# Patient Record
Sex: Female | Born: 1965 | Race: White | Hispanic: No | Marital: Married | Smoking: Never smoker
Health system: Southern US, Community
[De-identification: ages and names within clinical notes are randomized; demographics above are authoritative.]

## PROBLEM LIST (undated history)

## (undated) DIAGNOSIS — E079 Disorder of thyroid, unspecified: Secondary | ICD-10-CM

## (undated) DIAGNOSIS — R011 Cardiac murmur, unspecified: Secondary | ICD-10-CM

## (undated) DIAGNOSIS — F329 Major depressive disorder, single episode, unspecified: Secondary | ICD-10-CM

## (undated) DIAGNOSIS — N200 Calculus of kidney: Secondary | ICD-10-CM

## (undated) DIAGNOSIS — G43909 Migraine, unspecified, not intractable, without status migrainosus: Secondary | ICD-10-CM

## (undated) DIAGNOSIS — R112 Nausea with vomiting, unspecified: Secondary | ICD-10-CM

## (undated) DIAGNOSIS — F319 Bipolar disorder, unspecified: Secondary | ICD-10-CM

## (undated) DIAGNOSIS — F419 Anxiety disorder, unspecified: Secondary | ICD-10-CM

## (undated) DIAGNOSIS — K219 Gastro-esophageal reflux disease without esophagitis: Secondary | ICD-10-CM

## (undated) DIAGNOSIS — Z9189 Other specified personal risk factors, not elsewhere classified: Secondary | ICD-10-CM

## (undated) DIAGNOSIS — F32A Depression, unspecified: Secondary | ICD-10-CM

## (undated) DIAGNOSIS — C439 Malignant melanoma of skin, unspecified: Secondary | ICD-10-CM

## (undated) DIAGNOSIS — E039 Hypothyroidism, unspecified: Secondary | ICD-10-CM

## (undated) DIAGNOSIS — R51 Headache: Secondary | ICD-10-CM

## (undated) DIAGNOSIS — Z9889 Other specified postprocedural states: Secondary | ICD-10-CM

## (undated) DIAGNOSIS — R519 Headache, unspecified: Secondary | ICD-10-CM

## (undated) HISTORY — DX: Anxiety disorder, unspecified: F41.9

## (undated) HISTORY — DX: Migraine, unspecified, not intractable, without status migrainosus: G43.909

## (undated) HISTORY — PX: INNER EAR SURGERY: SHX679

## (undated) HISTORY — DX: Depression, unspecified: F32.A

## (undated) HISTORY — PX: BREAST EXCISIONAL BIOPSY: SUR124

## (undated) HISTORY — DX: Bipolar disorder, unspecified: F31.9

## (undated) HISTORY — PX: WISDOM TOOTH EXTRACTION: SHX21

## (undated) HISTORY — DX: Disorder of thyroid, unspecified: E07.9

## (undated) HISTORY — DX: Other specified personal risk factors, not elsewhere classified: Z91.89

## (undated) HISTORY — DX: Calculus of kidney: N20.0

## (undated) HISTORY — DX: Major depressive disorder, single episode, unspecified: F32.9

## (undated) HISTORY — DX: Malignant melanoma of skin, unspecified: C43.9

## (undated) HISTORY — DX: Headache: R51

## (undated) HISTORY — DX: Headache, unspecified: R51.9

## (undated) HISTORY — DX: Gastro-esophageal reflux disease without esophagitis: K21.9

## (undated) HISTORY — DX: Cardiac murmur, unspecified: R01.1

---
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
## 1997-12-17 ENCOUNTER — Inpatient Hospital Stay (HOSPITAL_COMMUNITY): Admission: AD | Admit: 1997-12-17 | Discharge: 1997-12-17 | Payer: Self-pay | Admitting: Obstetrics and Gynecology

## 1998-03-21 ENCOUNTER — Ambulatory Visit (HOSPITAL_COMMUNITY): Admission: RE | Admit: 1998-03-21 | Discharge: 1998-03-21 | Payer: Self-pay | Admitting: Obstetrics and Gynecology

## 1998-06-18 ENCOUNTER — Inpatient Hospital Stay (HOSPITAL_COMMUNITY): Admission: AD | Admit: 1998-06-18 | Discharge: 1998-06-21 | Payer: Self-pay | Admitting: Obstetrics and Gynecology

## 1998-07-14 HISTORY — PX: ABDOMINAL HYSTERECTOMY: SHX81

## 1998-07-14 HISTORY — PX: CHOLECYSTECTOMY: SHX55

## 1998-07-23 ENCOUNTER — Other Ambulatory Visit: Admission: RE | Admit: 1998-07-23 | Discharge: 1998-07-23 | Payer: Self-pay | Admitting: Obstetrics and Gynecology

## 1998-08-21 ENCOUNTER — Encounter: Payer: Self-pay | Admitting: Surgery

## 1998-08-21 ENCOUNTER — Ambulatory Visit (HOSPITAL_COMMUNITY): Admission: RE | Admit: 1998-08-21 | Discharge: 1998-08-22 | Payer: Self-pay | Admitting: Surgery

## 1999-04-15 ENCOUNTER — Inpatient Hospital Stay (HOSPITAL_COMMUNITY): Admission: RE | Admit: 1999-04-15 | Discharge: 1999-04-18 | Payer: Self-pay | Admitting: Obstetrics and Gynecology

## 1999-04-15 ENCOUNTER — Encounter (INDEPENDENT_AMBULATORY_CARE_PROVIDER_SITE_OTHER): Payer: Self-pay

## 2000-05-13 ENCOUNTER — Other Ambulatory Visit: Admission: RE | Admit: 2000-05-13 | Discharge: 2000-05-13 | Payer: Self-pay | Admitting: Obstetrics and Gynecology

## 2001-09-08 ENCOUNTER — Other Ambulatory Visit: Admission: RE | Admit: 2001-09-08 | Discharge: 2001-09-08 | Payer: Self-pay | Admitting: Obstetrics and Gynecology

## 2003-02-28 ENCOUNTER — Other Ambulatory Visit: Admission: RE | Admit: 2003-02-28 | Discharge: 2003-02-28 | Payer: Self-pay | Admitting: Obstetrics and Gynecology

## 2004-03-27 ENCOUNTER — Other Ambulatory Visit: Admission: RE | Admit: 2004-03-27 | Discharge: 2004-03-27 | Payer: Self-pay | Admitting: Obstetrics and Gynecology

## 2005-04-22 ENCOUNTER — Other Ambulatory Visit: Admission: RE | Admit: 2005-04-22 | Discharge: 2005-04-22 | Payer: Self-pay | Admitting: Obstetrics and Gynecology

## 2007-03-26 ENCOUNTER — Ambulatory Visit: Payer: Self-pay | Admitting: Oncology

## 2007-11-22 ENCOUNTER — Encounter: Admission: RE | Admit: 2007-11-22 | Discharge: 2007-11-22 | Payer: Self-pay | Admitting: Obstetrics and Gynecology

## 2008-10-23 ENCOUNTER — Ambulatory Visit: Payer: Self-pay | Admitting: Oncology

## 2009-08-03 ENCOUNTER — Ambulatory Visit (HOSPITAL_COMMUNITY): Admission: RE | Admit: 2009-08-03 | Discharge: 2009-08-03 | Payer: Self-pay | Admitting: Obstetrics and Gynecology

## 2010-09-24 ENCOUNTER — Other Ambulatory Visit: Payer: Self-pay | Admitting: Obstetrics and Gynecology

## 2010-09-24 DIAGNOSIS — R928 Other abnormal and inconclusive findings on diagnostic imaging of breast: Secondary | ICD-10-CM

## 2010-09-30 LAB — CBC
HCT: 39.2 % (ref 36.0–46.0)
Hemoglobin: 13.2 g/dL (ref 12.0–15.0)
MCHC: 33.8 g/dL (ref 30.0–36.0)
MCV: 89.8 fL (ref 78.0–100.0)
Platelets: 342 10*3/uL (ref 150–400)
RBC: 4.37 MIL/uL (ref 3.87–5.11)
RDW: 12.5 % (ref 11.5–15.5)
WBC: 8.6 10*3/uL (ref 4.0–10.5)

## 2010-10-01 ENCOUNTER — Other Ambulatory Visit: Payer: Self-pay

## 2010-10-21 ENCOUNTER — Ambulatory Visit
Admission: RE | Admit: 2010-10-21 | Discharge: 2010-10-21 | Disposition: A | Discharge status: Home / Self Care | Payer: 59 | Source: Ambulatory Visit | Attending: Obstetrics and Gynecology | Admitting: Obstetrics and Gynecology

## 2010-10-21 DIAGNOSIS — R928 Other abnormal and inconclusive findings on diagnostic imaging of breast: Secondary | ICD-10-CM

## 2011-05-24 ENCOUNTER — Ambulatory Visit: Payer: Self-pay | Admitting: Family Medicine

## 2013-05-05 ENCOUNTER — Other Ambulatory Visit: Payer: Self-pay | Admitting: Obstetrics and Gynecology

## 2013-05-05 DIAGNOSIS — Z803 Family history of malignant neoplasm of breast: Secondary | ICD-10-CM

## 2014-12-26 ENCOUNTER — Encounter: Payer: Self-pay | Admitting: Family Medicine

## 2014-12-26 ENCOUNTER — Ambulatory Visit (INDEPENDENT_AMBULATORY_CARE_PROVIDER_SITE_OTHER): Payer: 59 | Admitting: Family Medicine

## 2014-12-26 ENCOUNTER — Encounter (INDEPENDENT_AMBULATORY_CARE_PROVIDER_SITE_OTHER): Payer: Self-pay

## 2014-12-26 VITALS — BP 124/82 | HR 65 | Temp 98.2°F | Ht 63.0 in | Wt 217.0 lb

## 2014-12-26 DIAGNOSIS — E039 Hypothyroidism, unspecified: Secondary | ICD-10-CM | POA: Diagnosis not present

## 2014-12-26 DIAGNOSIS — F329 Major depressive disorder, single episode, unspecified: Secondary | ICD-10-CM

## 2014-12-26 DIAGNOSIS — R41840 Attention and concentration deficit: Secondary | ICD-10-CM | POA: Insufficient documentation

## 2014-12-26 DIAGNOSIS — F32A Depression, unspecified: Secondary | ICD-10-CM | POA: Insufficient documentation

## 2014-12-26 DIAGNOSIS — G43909 Migraine, unspecified, not intractable, without status migrainosus: Secondary | ICD-10-CM | POA: Insufficient documentation

## 2014-12-26 DIAGNOSIS — R5383 Other fatigue: Secondary | ICD-10-CM | POA: Insufficient documentation

## 2014-12-26 DIAGNOSIS — G43809 Other migraine, not intractable, without status migrainosus: Secondary | ICD-10-CM

## 2014-12-26 MED ORDER — SERTRALINE HCL 100 MG PO TABS: 100.0000 mg | ORAL_TABLET | Freq: Every day | ORAL | Status: DC

## 2014-12-26 NOTE — Assessment & Plan Note (Signed)
Well controlled on abortive therapy only prn. No changes made today.

## 2014-12-26 NOTE — Assessment & Plan Note (Signed)
Deteriorated. Refer for psychotherapy, increase zoloft to 100 mg daily. She will update me in a few weeks. The patient indicates understanding of these issues and agrees with the plan.

## 2014-12-26 NOTE — Progress Notes (Signed)
Subjective:   Patient ID: Dominique Nunez, female    DOB: [birth year], 49 y.o.   MRN: [id number]  Azora Bonzo is a pleasant 49 y.o. year old female who presents to clinic today with Establish Care and Back Pain  on 12/26/2014  HPI:  Hypothyroidism- has been on replacement therapy for 2 years.  Currently taking 75 mcg daily. +FH of thyroid disease.  Has been more tired lately, sore and decreased concentration. Denies any other symptoms of hypo or hyperthyroidism.  No results found for: TSH   ?ADD- saw a psychiatrist one time.  She is not sure if he thought she had ADD but he placed her on multiple antidepressants/mood stabilizers.  Anxiety and depression- feels symptoms are relatively well controlled on zoloft 75 mg daily with rare klonipin. Has been more sad and difficulty sleeping.  Migraines- has had them since childhood. Now gets one every few months.  They are associated with nausea, vomiting and photophobia.  Takes maxalt as needed.  No current outpatient prescriptions on file prior to visit.   No current facility-administered medications on file prior to visit.    No Known Allergies  Past Medical History  Diagnosis Date  . Depression   . History of fainting spells of unknown cause   . GERD (gastroesophageal reflux disease)   . Heart murmur   . Kidney stones   . Thyroid disease   . Headache   . Migraine     Past Surgical History  Procedure Laterality Date  . Cholecystectomy  2000  . Breast biopsy  1984  . Tonsillectomy and adenoidectomy  1976  . Abdominal hysterectomy  2000    Family History  Problem Relation Age of Onset  . Cancer Mother   . Ovarian cancer Mother   . Asthma Mother   . COPD Mother   . Alcohol abuse Father   . Kidney disease Father   . Diabetes Father   . Kidney disease Brother   . Hepatitis C Brother   . Stroke Paternal Aunt   . Cancer Paternal Aunt   . Alcohol abuse Maternal Grandfather   . Alcohol abuse Paternal  Grandfather   . Arthritis Paternal Grandfather   . Cancer Paternal Grandfather   . Heart disease Paternal Grandfather   . Stroke Paternal Grandfather     History   Social History  . Marital Status: Married    Spouse Name: N/A  . Number of Children: N/A  . Years of Education: N/A   Occupational History  . Not on file.   Social History Main Topics  . Smoking status: Never Smoker   . Smokeless tobacco: Never Used  . Alcohol Use: Yes  . Drug Use: No  . Sexual Activity: Yes   Other Topics Concern  . Not on file   Social History Narrative   The PMH, PSH, Social History, Family History, Medications, and allergies have been reviewed in [facility], and have been updated if relevant.   Review of Systems  Constitutional: Positive for fatigue. Negative for fever and unexpected weight change.  Eyes: Negative.   Respiratory: Negative.   Cardiovascular: Negative.   Endocrine: Negative.   Genitourinary: Negative.   Musculoskeletal: Positive for back pain.  Skin: Negative.   Neurological: Positive for headaches. Negative for dizziness, tremors, seizures, syncope, facial asymmetry, speech difficulty, weakness, light-headedness and numbness.  Hematological: Negative.   Psychiatric/Behavioral: Positive for sleep disturbance, dysphoric mood and decreased concentration. Negative for suicidal ideas, hallucinations, behavioral problems, confusion, self-injury and  agitation. The patient is not nervous/anxious and is not hyperactive.   All other systems reviewed and are negative.      Objective:    BP 124/82 mmHg  Pulse 65  Temp(Src) 98.2 F (36.8 C) (Oral)  Ht 5\' 3"  (1.6 m)  Wt 217 lb (98.431 kg)  BMI 38.45 kg/m2  SpO2 97%   Physical Exam  Constitutional: She is oriented to person, place, and time. She appears well-developed and well-nourished. No distress.  HENT:  Head: Normocephalic and atraumatic.  Eyes: Conjunctivae are normal.  Neck: Normal range of motion. Neck supple. No JVD  present. No tracheal deviation present. No thyromegaly present.  Cardiovascular: Normal rate, regular rhythm and normal heart sounds.   Pulmonary/Chest: Effort normal and breath sounds normal. No stridor. No respiratory distress. She has no wheezes. She has no rales. She exhibits no tenderness.  Abdominal: Soft. Bowel sounds are normal.  Lymphadenopathy:    She has no cervical adenopathy.  Neurological: She is alert and oriented to person, place, and time. She displays normal reflexes. No cranial nerve deficit. Coordination normal.  Skin: Skin is dry.  Psychiatric: She has a normal mood and affect. Her behavior is normal. Judgment and thought content normal.  Nursing note and vitals reviewed.         Assessment & Plan:   Hypothyroidism, unspecified hypothyroidism type  Depression  Other type of migraine No Follow-up on file.

## 2014-12-26 NOTE — Assessment & Plan Note (Signed)
Pt is concerned that she is under corrected/hypothyroid. Check labs today. The patient indicates understanding of these issues and agrees with the plan.

## 2014-12-26 NOTE — Progress Notes (Signed)
Pre visit review using our clinic review tool, if applicable. No additional management support is needed unless otherwise documented below in the visit note. 

## 2014-12-26 NOTE — Patient Instructions (Signed)
Great to see you.  We are increasing your zoloft to 100 mg daily.  Please stop by to see Rosaria Ferries to set up your psych referral.  I will call you with your lab results.

## 2014-12-26 NOTE — Assessment & Plan Note (Signed)
She is concerned that she has adult onset ADD.  I advised her that I cannot manage ADD without a formal psych eval.  She agrees to referral- placed today. The patient indicates understanding of these issues and agrees with the plan.

## 2014-12-27 LAB — COMPREHENSIVE METABOLIC PANEL
A/G RATIO: 2 (ref 1.1–2.5)
ALBUMIN: 4.2 g/dL (ref 3.5–5.5)
ALK PHOS: 82 IU/L (ref 39–117)
ALT: 12 IU/L (ref 0–32)
AST: 12 IU/L (ref 0–40)
BILIRUBIN TOTAL: 0.3 mg/dL (ref 0.0–1.2)
BUN / CREAT RATIO: 15 (ref 9–23)
BUN: 11 mg/dL (ref 6–24)
CO2: 23 mmol/L (ref 18–29)
CREATININE: 0.71 mg/dL (ref 0.57–1.00)
Calcium: 9.7 mg/dL (ref 8.7–10.2)
Chloride: 101 mmol/L (ref 97–108)
GFR, EST AFRICAN AMERICAN: 117 mL/min/{1.73_m2} (ref >59)
GFR, EST NON AFRICAN AMERICAN: 102 mL/min/{1.73_m2} (ref >59)
GLOBULIN, TOTAL: 2.1 g/dL (ref 1.5–4.5)
Glucose: 86 mg/dL (ref 65–99)
Potassium: 4.6 mmol/L (ref 3.5–5.2)
SODIUM: 141 mmol/L (ref 134–144)
Total Protein: 6.3 g/dL (ref 6.0–8.5)

## 2014-12-27 LAB — CBC WITH DIFFERENTIAL/PLATELET
Basophils Absolute: 0 10*3/uL (ref 0.0–0.2)
Basos: 0 %
EOS (ABSOLUTE): 0.2 10*3/uL (ref 0.0–0.4)
Eos: 2 %
Hematocrit: 39.6 % (ref 34.0–46.6)
Hemoglobin: 12.7 g/dL (ref 11.1–15.9)
IMMATURE GRANS (ABS): 0 10*3/uL (ref 0.0–0.1)
IMMATURE GRANULOCYTES: 0 %
LYMPHS ABS: 3.2 10*3/uL — AB (ref 0.7–3.1)
Lymphs: 31 %
MCH: 28.2 pg (ref 26.6–33.0)
MCHC: 32.1 g/dL (ref 31.5–35.7)
MCV: 88 fL (ref 79–97)
MONOS ABS: 0.7 10*3/uL (ref 0.1–0.9)
Monocytes: 7 %
NEUTROS PCT: 60 %
Neutrophils Absolute: 6.1 10*3/uL (ref 1.4–7.0)
PLATELETS: 354 10*3/uL (ref 150–379)
RBC: 4.5 x10E6/uL (ref 3.77–5.28)
RDW: 13.4 % (ref 12.3–15.4)
WBC: 10.3 10*3/uL (ref 3.4–10.8)

## 2014-12-27 LAB — TSH: TSH: 0.791 u[IU]/mL (ref 0.450–4.500)

## 2014-12-27 LAB — VITAMIN D 25 HYDROXY (VIT D DEFICIENCY, FRACTURES): VIT D 25 HYDROXY: 18.4 ng/mL — AB (ref 30.0–100.0)

## 2014-12-27 LAB — T4, FREE: FREE T4: 1.51 ng/dL (ref 0.82–1.77)

## 2014-12-27 LAB — VITAMIN B12: Vitamin B-12: 371 pg/mL (ref 211–946)

## 2014-12-27 MED ORDER — VITAMIN D (ERGOCALCIFEROL) 1.25 MG (50000 UNIT) PO CAPS: 50000.0000 [IU] | ORAL_CAPSULE | ORAL | Status: DC

## 2014-12-27 NOTE — Addendum Note (Signed)
Addended by: Modena Nunnery on: 12/27/2014 11:45 AM   Modules accepted: Orders

## 2015-01-04 ENCOUNTER — Telehealth: Payer: Self-pay

## 2015-01-04 NOTE — Telephone Encounter (Signed)
Pt left v/m requesting cb. Left v/m requesting pt to cb. 

## 2015-01-05 ENCOUNTER — Other Ambulatory Visit: Payer: Self-pay

## 2015-01-05 MED ORDER — RIZATRIPTAN BENZOATE 10 MG PO TABS
10.0000 mg | ORAL_TABLET | ORAL | Status: DC | PRN
Start: 2015-01-05 — End: 2021-04-01

## 2015-01-05 MED ORDER — ZOLPIDEM TARTRATE 10 MG PO TABS: 10.0000 mg | ORAL_TABLET | Freq: Every evening | ORAL | Status: DC | PRN

## 2015-01-05 MED ORDER — CLONAZEPAM 0.5 MG PO TABS: 0.5000 mg | ORAL_TABLET | Freq: Every day | ORAL | Status: DC | PRN

## 2015-01-05 NOTE — Telephone Encounter (Signed)
Pt has meds to transfer to Dr Deborra Medina so Dr Deborra Medina will subscribe.

## 2015-01-05 NOTE — Telephone Encounter (Signed)
Pt left v/m; pt established with Dr Deborra Medina on 12/26/14; pts GYN wants PCP to refill clonazepam,maxalt and zolpidem. Please advise. [employer]. Pt request cb when refills done.

## 2015-01-05 NOTE — Telephone Encounter (Signed)
Rx called in to requested pharmacy 

## 2015-01-11 ENCOUNTER — Ambulatory Visit (INDEPENDENT_AMBULATORY_CARE_PROVIDER_SITE_OTHER): Payer: 59 | Admitting: Psychology

## 2015-01-11 DIAGNOSIS — F332 Major depressive disorder, recurrent severe without psychotic features: Secondary | ICD-10-CM | POA: Diagnosis not present

## 2015-01-29 ENCOUNTER — Ambulatory Visit: Payer: 59 | Admitting: Family Medicine

## 2015-02-14 ENCOUNTER — Encounter: Payer: Self-pay | Admitting: Family Medicine

## 2015-02-14 ENCOUNTER — Ambulatory Visit (INDEPENDENT_AMBULATORY_CARE_PROVIDER_SITE_OTHER): Payer: 59 | Admitting: Family Medicine

## 2015-02-14 VITALS — BP 116/72 | HR 63 | Temp 97.9°F | Wt 221.0 lb

## 2015-02-14 DIAGNOSIS — R41840 Attention and concentration deficit: Secondary | ICD-10-CM | POA: Diagnosis not present

## 2015-02-14 DIAGNOSIS — E039 Hypothyroidism, unspecified: Secondary | ICD-10-CM | POA: Diagnosis not present

## 2015-02-14 DIAGNOSIS — E559 Vitamin D deficiency, unspecified: Secondary | ICD-10-CM | POA: Diagnosis not present

## 2015-02-14 DIAGNOSIS — F329 Major depressive disorder, single episode, unspecified: Secondary | ICD-10-CM | POA: Diagnosis not present

## 2015-02-14 DIAGNOSIS — R5383 Other fatigue: Secondary | ICD-10-CM

## 2015-02-14 DIAGNOSIS — F32A Depression, unspecified: Secondary | ICD-10-CM

## 2015-02-14 MED ORDER — BUPROPION HCL ER (XL) 150 MG PO TB24: 150.0000 mg | ORAL_TABLET | Freq: Every day | ORAL | Status: DC

## 2015-02-14 NOTE — Assessment & Plan Note (Signed)
Persistent issue. Likely multifactorial- depression likely playing a roll.  Hopefully Wellbutrin will help with this as well. Recheck vit D. Level today.

## 2015-02-14 NOTE — Addendum Note (Signed)
Addended by: Daralene Milch C on: 02/14/2015 11:16 AM   Modules accepted: Orders

## 2015-02-14 NOTE — Progress Notes (Signed)
Subjective:   Patient ID: Dominique Nunez, female    DOB: 07/11/1966, 49 y.o.   MRN: [id number]  Dominique Nunez is a pleasant 49 y.o. year old female who presents to clinic today with Follow-up  on 02/14/2015  HPI:  Saw her in June 2016 to establish care and at that time she complained of  worsening fatigue and decreased concentration.  Referred her for psychotherapy/ADD evaluation.  Has had one session with therapist.  She is not sure what the final diagnosis was.  Going back next week.  Anxiety/depression- increased zoloft to 100 mg daily but she is still feeling tearful and "not wanting get out of bed in the morning." No SI or HI.  Not sleeping well.  Vit D deficiency - repleted with high dose Vit D.  Still fatigued.  Current Outpatient Prescriptions on File Prior to Visit  Medication Sig Dispense Refill  . clonazePAM (KLONOPIN) 0.5 MG tablet Take 1 tablet (0.5 mg total) by mouth daily as needed for anxiety. 30 tablet 0  . levothyroxine (SYNTHROID, LEVOTHROID) 75 MCG tablet Take 75 mcg by mouth daily before breakfast.    . rizatriptan (MAXALT) 10 MG tablet Take 1 tablet (10 mg total) by mouth as needed for migraine. May repeat in 2 hours if needed 10 tablet 0  . sertraline (ZOLOFT) 100 MG tablet Take 1 tablet (100 mg total) by mouth daily. 30 tablet 3  . zolpidem (AMBIEN) 10 MG tablet Take 1 tablet (10 mg total) by mouth at bedtime as needed for sleep. 30 tablet 0   No current facility-administered medications on file prior to visit.    No Known Allergies  Past Medical History  Diagnosis Date  . Depression   . History of fainting spells of unknown cause   . GERD (gastroesophageal reflux disease)   . Heart murmur   . Kidney stones   . Thyroid disease   . Headache   . Migraine     Past Surgical History  Procedure Laterality Date  . Cholecystectomy  2000  . Breast biopsy  1984  . Tonsillectomy and adenoidectomy  1976  . Abdominal hysterectomy  2000    Family  History  Problem Relation Age of Onset  . Cancer Mother   . Ovarian cancer Mother   . Asthma Mother   . COPD Mother   . Alcohol abuse Father   . Kidney disease Father   . Diabetes Father   . Kidney disease Brother   . Hepatitis C Brother   . Stroke Paternal Aunt   . Cancer Paternal Aunt   . Alcohol abuse Maternal Grandfather   . Alcohol abuse Paternal Grandfather   . Arthritis Paternal Grandfather   . Cancer Paternal Grandfather   . Heart disease Paternal Grandfather   . Stroke Paternal Grandfather     History   Social History  . Marital Status: Married    Spouse Name: N/A  . Number of Children: N/A  . Years of Education: N/A   Occupational History  . Not on file.   Social History Main Topics  . Smoking status: Never Smoker   . Smokeless tobacco: Never Used  . Alcohol Use: Yes  . Drug Use: No  . Sexual Activity: Yes   Other Topics Concern  . Not on file   Social History Narrative   The PMH, PSH, Social History, Family History, Medications, and allergies have been reviewed in [facility], and have been updated if relevant.    Review of  Systems  Constitutional: Positive for fatigue. Negative for fever and unexpected weight change.  HENT: Negative.   Eyes: Negative.   Respiratory: Negative.   Cardiovascular: Negative.   Gastrointestinal: Negative.   Endocrine: Negative.   Genitourinary: Negative.   Musculoskeletal: Negative.   Skin: Negative.   Allergic/Immunologic: Negative.   Neurological: Negative.   Hematological: Negative.   Psychiatric/Behavioral: Negative.   All other systems reviewed and are negative.      Objective:    BP 116/72 mmHg  Pulse 63  Temp(Src) 97.9 F (36.6 C) (Oral)  Wt 221 lb (100.245 kg)  SpO2 97%   Physical Exam   General:  Well-developed,well-nourished,in no acute distress; alert,appropriate and cooperative throughout examination Head:  normocephalic and atraumatic.   Lungs:  Normal respiratory effort, chest expands  symmetrically. Lungs are clear to auscultation, no crackles or wheezes. Heart:  Normal rate and regular rhythm. S1 and S2 normal without gallop, murmur, click, rub or other extra sounds. Abdomen:  Bowel sounds positive,abdomen soft and non-tender without masses, organomegaly or hernias noted. Msk:  No deformity or scoliosis noted of thoracic or lumbar spine.   Extremities:  No clubbing, cyanosis, edema, or deformity noted with normal full range of motion of all joints.   Neurologic:  alert & oriented X3 and gait normal.   Skin:  Intact without suspicious lesions or rashes Cervical Nodes:  No lymphadenopathy noted Axillary Nodes:  No palpable lymphadenopathy Psych:  Cognition and judgment appear intact. Alert and cooperative with normal attention span and concentration. No apparent delusions, illusions, hallucinations       Assessment & Plan:   Vitamin D deficiency  Depression  Hypothyroidism, unspecified hypothyroidism type  Poor concentration No Follow-up on file.

## 2015-02-14 NOTE — Assessment & Plan Note (Signed)
>  25 minutes spent in face to face time with patient, >50% spent in counselling or coordination of care Deteriorated. Advised to continue psychotherapy. Add Wellbutrin 150 mg XL daily to hopefully help with anhedonia and decreased concentration.

## 2015-02-14 NOTE — Patient Instructions (Signed)
We are adding Wellbutrin 150 mg XL daily to your current medicaitons. Please call me in a few weeks with an update.  We will call you with your lab results.

## 2015-02-14 NOTE — Progress Notes (Signed)
Pre visit review using our clinic review tool, if applicable. No additional management support is needed unless otherwise documented below in the visit note. 

## 2015-02-15 LAB — T4, FREE: FREE T4: 1.24 ng/dL (ref 0.82–1.77)

## 2015-02-15 LAB — TSH: TSH: 3.94 u[IU]/mL (ref 0.450–4.500)

## 2015-02-15 LAB — VITAMIN D 25 HYDROXY (VIT D DEFICIENCY, FRACTURES): VIT D 25 HYDROXY: 19.4 ng/mL — AB (ref 30.0–100.0)

## 2015-02-16 MED ORDER — VITAMIN D (ERGOCALCIFEROL) 1.25 MG (50000 UNIT) PO CAPS: 50000.0000 [IU] | ORAL_CAPSULE | ORAL | Status: DC

## 2015-02-16 NOTE — Addendum Note (Signed)
Addended by: Modena Nunnery on: 02/16/2015 11:07 AM   Modules accepted: Orders

## 2015-02-22 ENCOUNTER — Ambulatory Visit: Payer: 59 | Admitting: Psychology

## 2015-02-27 ENCOUNTER — Other Ambulatory Visit: Payer: 59

## 2015-03-16 ENCOUNTER — Telehealth: Payer: Self-pay | Admitting: *Deleted

## 2015-03-16 ENCOUNTER — Other Ambulatory Visit: Payer: Self-pay | Admitting: *Deleted

## 2015-03-16 MED ORDER — BUPROPION HCL ER (XL) 150 MG PO TB24: 150.0000 mg | ORAL_TABLET | Freq: Every day | ORAL | Status: DC

## 2015-03-16 MED ORDER — SERTRALINE HCL 100 MG PO TABS: 100.0000 mg | ORAL_TABLET | Freq: Every day | ORAL | Status: DC

## 2015-03-16 NOTE — Telephone Encounter (Signed)
Pt left message with Triage. Pt's sciatic nerve pain is really bothering and causing her bad back pain that's making it hard to sleep at night. Pt is requesting tramadol or something mild to help with pain  Spoke to [place] and she advise me Dr. Deborra Medina doesn't prescribe any new pain meds until she has been eval for problems so pt would need an appt. Called pt back and schedule appt with Dr. Deborra Medina on 03/20/15

## 2015-03-20 ENCOUNTER — Ambulatory Visit (INDEPENDENT_AMBULATORY_CARE_PROVIDER_SITE_OTHER)
Admission: RE | Admit: 2015-03-20 | Discharge: 2015-03-20 | Disposition: A | Discharge status: Home / Self Care | Payer: 59 | Source: Ambulatory Visit | Attending: Family Medicine | Admitting: Family Medicine

## 2015-03-20 ENCOUNTER — Telehealth: Payer: Self-pay | Admitting: Family Medicine

## 2015-03-20 ENCOUNTER — Encounter: Payer: Self-pay | Admitting: Family Medicine

## 2015-03-20 ENCOUNTER — Ambulatory Visit (INDEPENDENT_AMBULATORY_CARE_PROVIDER_SITE_OTHER): Payer: 59 | Admitting: Family Medicine

## 2015-03-20 VITALS — BP 118/70 | HR 70 | Temp 98.3°F | Wt 218.0 lb

## 2015-03-20 DIAGNOSIS — M545 Low back pain, unspecified: Secondary | ICD-10-CM | POA: Insufficient documentation

## 2015-03-20 DIAGNOSIS — M5442 Lumbago with sciatica, left side: Secondary | ICD-10-CM

## 2015-03-20 DIAGNOSIS — M5440 Lumbago with sciatica, unspecified side: Secondary | ICD-10-CM

## 2015-03-20 MED ORDER — PREDNISONE 20 MG PO TABS: ORAL_TABLET | ORAL | Status: DC

## 2015-03-20 NOTE — Telephone Encounter (Signed)
Patient returned [facility] phone call about her x-ray results.

## 2015-03-20 NOTE — Patient Instructions (Signed)
Good to see you. Please do exercises as directed.  Take prednisone as directed- in the morning and with food.  We will call you with your xray results.  Please keep [place] updated.

## 2015-03-20 NOTE — Progress Notes (Signed)
Pre visit review using our clinic review tool, if applicable. No additional management support is needed unless otherwise documented below in the visit note. 

## 2015-03-20 NOTE — Progress Notes (Signed)
SUBJECTIVE:  Dominique Nunez is a 49 y.o. female who complains of worsening low back pain 2 week(s).  Chronic low back pain with sciatic intermittent since she was pregnant with her now 63 year old daughter.  Alleve and or Ibuprofen using "takes the edge off."  No known injury but for past two weeks pain is a 7-8/10 constantly.  NSAIDs not effective.  The pain is positional with bending or lifting, with radiation down the legs. Symptoms have been constant since that time.  There is no numbness in the legs.   Current Outpatient Prescriptions on File Prior to Visit  Medication Sig Dispense Refill  . buPROPion (WELLBUTRIN XL) 150 MG 24 hr tablet Take 1 tablet (150 mg total) by mouth daily. 90 tablet 1  . clonazePAM (KLONOPIN) 0.5 MG tablet Take 1 tablet (0.5 mg total) by mouth daily as needed for anxiety. 30 tablet 0  . levothyroxine (SYNTHROID, LEVOTHROID) 75 MCG tablet Take 75 mcg by mouth daily before breakfast.    . rizatriptan (MAXALT) 10 MG tablet Take 1 tablet (10 mg total) by mouth as needed for migraine. May repeat in 2 hours if needed 10 tablet 0  . sertraline (ZOLOFT) 100 MG tablet Take 1 tablet (100 mg total) by mouth daily. 90 tablet 1  . Vitamin D, Ergocalciferol, (DRISDOL) 50000 UNITS CAPS capsule Take 1 capsule (50,000 Units total) by mouth every 7 (seven) days. 6 capsule 0  . zolpidem (AMBIEN) 10 MG tablet Take 1 tablet (10 mg total) by mouth at bedtime as needed for sleep. 30 tablet 0   No current facility-administered medications on file prior to visit.    No Known Allergies  Past Medical History  Diagnosis Date  . Depression   . History of fainting spells of unknown cause   . GERD (gastroesophageal reflux disease)   . Heart murmur   . Kidney stones   . Thyroid disease   . Headache   . Migraine     Past Surgical History  Procedure Laterality Date  . Cholecystectomy  2000  . Breast biopsy  1984  . Tonsillectomy and adenoidectomy  1976  . Abdominal hysterectomy   2000    Family History  Problem Relation Age of Onset  . Cancer Mother   . Ovarian cancer Mother   . Asthma Mother   . COPD Mother   . Alcohol abuse Father   . Kidney disease Father   . Diabetes Father   . Kidney disease Brother   . Hepatitis C Brother   . Stroke Paternal Aunt   . Cancer Paternal Aunt   . Alcohol abuse Maternal Grandfather   . Alcohol abuse Paternal Grandfather   . Arthritis Paternal Grandfather   . Cancer Paternal Grandfather   . Heart disease Paternal Grandfather   . Stroke Paternal Grandfather     Social History   Social History  . Marital Status: Married    Spouse Name: N/A  . Number of Children: N/A  . Years of Education: N/A   Occupational History  . Not on file.   Social History Main Topics  . Smoking status: Never Smoker   . Smokeless tobacco: Never Used  . Alcohol Use: Yes  . Drug Use: No  . Sexual Activity: Yes   Other Topics Concern  . Not on file   Social History Narrative   The PMH, PSH, Social History, Family History, Medications, and allergies have been reviewed in [facility], and have been updated if relevant.  OBJECTIVE:  BP 118/70 mmHg  Pulse 70  Temp(Src) 98.3 F (36.8 C) (Oral)  Wt 218 lb (98.884 kg)  SpO2 98%  Vital signs as noted above. Patient appears to be in mild to moderate pain, antalgic gait noted. Lumbosacral spine area reveals no local tenderness or mass. Painful and reduced LS ROM noted. Straight leg raise is positive at 45 degrees on left. DTR's, motor strength and sensation normal, including heel and toe gait.  Peripheral pulses are palpable. Lumbar spine X-Ray: ordered, but results not yet available.   ASSESSMENT:  herniated disc likely at L5-S1 and with radiculopathy  PLAN: Prednisone taper- discussed how to take rx properly- in the morning, with food and NOT to be taken with NSAIDs. Discussed PT referral- she declined. Given exercises from sports med advisor.   Xray today given duration and acutely  worsening symptoms. The patient indicates understanding of these issues and agrees with the plan.

## 2015-03-21 NOTE — Telephone Encounter (Signed)
See results note. 

## 2015-04-02 ENCOUNTER — Telehealth: Payer: Self-pay

## 2015-04-02 NOTE — Telephone Encounter (Signed)
I do not manage chronic back pain with narcotics.  We could certainly try a prescription strength NSAID, like meloxicam if she would like to try this.

## 2015-04-02 NOTE — Telephone Encounter (Signed)
Pt left v/m;pt was seen 03/20/15 and prednisone helped the back pain but prednisone is finished now and pt needs med for pain. Pt request cb. [employer].

## 2015-04-03 NOTE — Telephone Encounter (Signed)
Spoke to pt who states she is willing to try meloxicam or anything that will help with her pain

## 2015-04-18 ENCOUNTER — Other Ambulatory Visit: Payer: Self-pay | Admitting: Family Medicine

## 2015-04-18 DIAGNOSIS — E559 Vitamin D deficiency, unspecified: Secondary | ICD-10-CM

## 2015-04-19 ENCOUNTER — Other Ambulatory Visit: Payer: Self-pay | Admitting: *Deleted

## 2015-04-24 ENCOUNTER — Other Ambulatory Visit: Payer: Self-pay

## 2015-05-31 ENCOUNTER — Ambulatory Visit (INDEPENDENT_AMBULATORY_CARE_PROVIDER_SITE_OTHER): Payer: 59 | Admitting: Family Medicine

## 2015-05-31 ENCOUNTER — Other Ambulatory Visit (HOSPITAL_COMMUNITY)
Admission: RE | Admit: 2015-05-31 | Discharge: 2015-05-31 | Disposition: A | Discharge status: Home / Self Care | Payer: 59 | Source: Ambulatory Visit | Attending: Family Medicine | Admitting: Family Medicine

## 2015-05-31 ENCOUNTER — Encounter: Payer: Self-pay | Admitting: Family Medicine

## 2015-05-31 VITALS — BP 126/72 | HR 84 | Temp 98.6°F | Wt 222.0 lb

## 2015-05-31 DIAGNOSIS — Z202 Contact with and (suspected) exposure to infections with a predominantly sexual mode of transmission: Secondary | ICD-10-CM

## 2015-05-31 DIAGNOSIS — Z113 Encounter for screening for infections with a predominantly sexual mode of transmission: Secondary | ICD-10-CM | POA: Diagnosis not present

## 2015-05-31 DIAGNOSIS — N760 Acute vaginitis: Secondary | ICD-10-CM | POA: Insufficient documentation

## 2015-05-31 DIAGNOSIS — R1012 Left upper quadrant pain: Secondary | ICD-10-CM | POA: Diagnosis not present

## 2015-05-31 DIAGNOSIS — M545 Low back pain: Secondary | ICD-10-CM | POA: Diagnosis not present

## 2015-05-31 DIAGNOSIS — M5442 Lumbago with sciatica, left side: Secondary | ICD-10-CM

## 2015-05-31 LAB — POCT URINALYSIS DIPSTICK
Bilirubin, UA: NEGATIVE
Glucose, UA: NEGATIVE
Ketones, UA: NEGATIVE
Leukocytes, UA: NEGATIVE
Nitrite, UA: NEGATIVE
PH UA: 6.5
PROTEIN UA: NEGATIVE
RBC UA: NEGATIVE
SPEC GRAV UA: 1.025
UROBILINOGEN UA: 0.2

## 2015-05-31 LAB — POCT URINE PREGNANCY: PREG TEST UR: NEGATIVE

## 2015-05-31 MED ORDER — MELOXICAM 7.5 MG PO TABS: 7.5000 mg | ORAL_TABLET | Freq: Every day | ORAL | Status: DC

## 2015-05-31 NOTE — Progress Notes (Signed)
Patient ID: Dominique Nunez, female   DOB: 1966/04/23, 49 y.o.   MRN: [id number]  Tommi Rumps, MD Phone: [phone]  Dominique Nunez is a 49 y.o. female who presents today for same-day appointment.  Patient reports several days of bilateral low back pain with possible radiation to left upper quadrant of her abdomen. She notes she's had pain in her back for several months. She notes painful urination during this time. She has a sensation of pressure in her bladder. She's not had any frequency or urgency or hematuria. She notes her urine is darker in color than usual. She's not had any fevers. She denies numbness, weakness, saddle anesthesia, bowel and bladder incontinence, and history of cancer. He notes a long history of low back pain with sciatica. She states she has a history of kidney stones, though has never had a scan to confirm this. She does note she is concerned about having an STD as her husband has been unfaithful recently. She would like blood work done today to test for STDs. She does note some clear vaginal discharge. She is sexually active with her husband. She has a history of a hysterectomy. She does not have any lower abdominal or pelvic pain. She feels well overall. She has no abdominal pain at this time.  PMH: nonsmoker.   ROS see history of present illness  Objective  Physical Exam Filed Vitals:   05/31/15 1613  BP: 126/72  Pulse: 84  Temp: 98.6 F (37 C)    Physical Exam  Constitutional: She is well-developed, well-nourished, and in no distress.  Patient is sitting comfortably on the exam table, she does not appear to be in any pain  HENT:  Head: Normocephalic and atraumatic.  Cardiovascular: Normal rate and regular rhythm.  Exam reveals no gallop and no friction rub.   No murmur heard. Pulmonary/Chest: Effort normal and breath sounds normal. No respiratory distress. She has no wheezes. She has no rales.  Abdominal: Soft. Bowel sounds are normal. She  exhibits no distension. There is no tenderness. There is no rebound and no guarding.  Genitourinary:  Normal labia, vaginal mucosa is normal, there is minimal clear discharge, no cervix appreciated in patient status post total hysterectomy, no adnexal tenderness or masses palpated  Musculoskeletal: She exhibits no edema.  No midline spine tenderness, no midline spine step-off, bilateral paraspinous muscular tenderness, no back swelling  Neurological: She is alert.  5 out of 5 strength in bilateral quads, hamstrings, plantar flexion, and dorsiflexion, sensation to light touch intact in bilateral lower extremities, 2+ patellar reflexes  Skin: Skin is warm and dry. She is not diaphoretic.     Assessment/Plan: Please see individual problem list.  Low back pain with radiation Patient with bilateral low back pain likely musculoskeletal in nature. She is neurologically intact and has no red flags. UA was negative for blood, nitrites, and leukocytes. discussed that this is unlikely to be related to a kidney stone given her well appearance and comfort and lack of blood in her urine, though did advise that she could've already passed the stone. I did discuss obtaining CT scan to definitively evaluate for kidney stone, though she prefers to monitor at this time. She has a benign abdominal exam today as well. She had a benign pelvic exam. We will check a CBC, lipase, and cemented to evaluate the abdominal portion of her discomfort. We will additionally check gonorrhea and chlamydia as well as wet prep for her abdominal discomfort and for STD checking.  we will also order an HIV, RPR, and HSV to complete STD workup. We will treat her back pain with Mobic. She was advised not to take any other anti-inflammatories with this. She's given return precautions.    Orders Placed This Encounter  Procedures  . Wet prep, genital  . CBC with Differential/Platelet    Standing Status: Future     Number of Occurrences:        Standing Expiration Date: 05/30/2016  . Comp Met (CMET)  . HIV antibody (with reflex)  . HSV(herpes smplx)abs-1+2(IgG+IgM)-bld  . Lipase  . RPR  . POCT Urinalysis Dipstick  . POCT urine pregnancy    Meds ordered this encounter  Medications  . meloxicam (MOBIC) 7.5 MG tablet    Sig: Take 1 tablet (7.5 mg total) by mouth daily. Do not take this with other anti-inflammatories.    Dispense:  30 tablet    Refill:  0     Tommi Rumps

## 2015-05-31 NOTE — Assessment & Plan Note (Addendum)
Patient with bilateral low back pain likely musculoskeletal in nature. She is neurologically intact and has no red flags. UA was negative for blood, nitrites, and leukocytes. discussed that this is unlikely to be related to a kidney stone given her well appearance and comfort and lack of blood in her urine, though did advise that she could've already passed the stone. I did discuss obtaining CT scan to definitively evaluate for kidney stone, though she prefers to monitor at this time. She has a benign abdominal exam today as well. She had a benign pelvic exam. We will check a CBC, lipase, and cemented to evaluate the abdominal portion of her discomfort. We will additionally check gonorrhea and chlamydia as well as wet prep for her abdominal discomfort and for STD checking.  we will also order an HIV, RPR, and HSV to complete STD workup. We will treat her back pain with Mobic. She was advised not to take any other anti-inflammatories with this. She's given return precautions.

## 2015-05-31 NOTE — Patient Instructions (Addendum)
Nice to meet you. Your back pain is likely musculoskeletal. Your urine was reassuring that there is no kidney stone or infection. We will check blood work to evaluate your discomfort further. You can take over-the-counter Advil as directed on the packaging. -After AVS is printed patient requested a prescription anti-inflammatory for her discomfort, she is provided with a prescription for meloxicam and advise not to take any other NSAIDs. If you develop persistent abdominal pain, back pain, fevers, numbness, weakness, loss of bowel or bladder function, chills, or any new or change in symptoms please seek medical attention.

## 2015-06-04 LAB — CERVICOVAGINAL ANCILLARY ONLY
Chlamydia: NEGATIVE
Neisseria Gonorrhea: NEGATIVE
Trichomonas: NEGATIVE

## 2015-06-05 ENCOUNTER — Other Ambulatory Visit: Payer: Self-pay

## 2015-06-06 ENCOUNTER — Telehealth: Payer: Self-pay | Admitting: Family Medicine

## 2015-06-06 LAB — RPR: RPR: NONREACTIVE

## 2015-06-06 LAB — CERVICOVAGINAL ANCILLARY ONLY
Bacterial vaginitis: POSITIVE — AB
CANDIDA VAGINITIS: NEGATIVE

## 2015-06-06 LAB — COMPREHENSIVE METABOLIC PANEL
A/G RATIO: 1.8 (ref 1.1–2.5)
ALBUMIN: 4.2 g/dL (ref 3.5–5.5)
ALT: 11 IU/L (ref 0–32)
AST: 15 IU/L (ref 0–40)
Alkaline Phosphatase: 79 IU/L (ref 39–117)
BUN / CREAT RATIO: 17 (ref 9–23)
BUN: 12 mg/dL (ref 6–24)
Bilirubin Total: <0.2 mg/dL (ref 0.0–1.2)
CALCIUM: 9.1 mg/dL (ref 8.7–10.2)
CO2: 21 mmol/L (ref 18–29)
CREATININE: 0.72 mg/dL (ref 0.57–1.00)
Chloride: 103 mmol/L (ref 97–106)
GFR, EST AFRICAN AMERICAN: 115 mL/min/{1.73_m2} (ref >59)
GFR, EST NON AFRICAN AMERICAN: 100 mL/min/{1.73_m2} (ref >59)
GLOBULIN, TOTAL: 2.4 g/dL (ref 1.5–4.5)
Glucose: 95 mg/dL (ref 65–99)
Potassium: 4.2 mmol/L (ref 3.5–5.2)
SODIUM: 143 mmol/L (ref 136–144)
TOTAL PROTEIN: 6.6 g/dL (ref 6.0–8.5)

## 2015-06-06 LAB — VAGINITIS/VAGINOSIS, DNA PROBE
Candida Species: NEGATIVE
GARDNERELLA VAGINALIS: POSITIVE — AB
TRICHOMONAS VAG: NEGATIVE

## 2015-06-06 LAB — HIV ANTIBODY (ROUTINE TESTING W REFLEX): HIV Screen 4th Generation wRfx: NONREACTIVE

## 2015-06-06 LAB — WET PREP, GENITAL

## 2015-06-06 LAB — HSV(HERPES SMPLX)ABS-I+II(IGG+IGM)-BLD
HSV 1 Glycoprotein G Ab, IgG: >62.2 index — ABNORMAL HIGH (ref 0.00–0.90)
HSV 2 Glycoprotein G Ab, IgG: 1.12 index — ABNORMAL HIGH (ref 0.00–0.90)

## 2015-06-06 LAB — TEST CODE CHANGE

## 2015-06-06 LAB — LIPASE: LIPASE: 45 U/L (ref 0–59)

## 2015-06-06 MED ORDER — METRONIDAZOLE 500 MG PO TABS: 500.0000 mg | ORAL_TABLET | Freq: Two times a day (BID) | ORAL | Status: DC

## 2015-06-06 NOTE — Telephone Encounter (Signed)
Pt would like to know her lab results

## 2015-06-06 NOTE — Telephone Encounter (Signed)
Called patient back as her lab results came over from [place]. Her CBC had not resulted in the computer and was present in the lab work that was faxed over. Her white blood cell count is elevated to 13.2. I called her back to discuss this. She does note increasing suprapubic discomfort and urethral pain. She denies fevers. She notes her symptoms have been getting worse. Discussed that given the worsening symptoms and the mildly elevated white count I would prefer that she get evaluated again. Advised her that there is a walkin clinic run by [facility] or she can go to an urgent care. She voiced understanding and will do this.Marland Kitchen

## 2015-06-06 NOTE — Telephone Encounter (Signed)
Pt is calling to check the status of her lab work from 05/31/2015. Please call pt on [phone]. Thank You!

## 2015-06-06 NOTE — Telephone Encounter (Signed)
Called patient and advise of lab results. Advised that her vaginal swabs revealed bacterial vaginosis. We will treat this with Flagyl. 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 Advised that we cannot tell her when she been exposed balance sheet had this. She is advised to monitor for rash and vesicles. Advised that her other lab work was unremarkable.

## 2015-06-07 LAB — SPECIMEN STATUS REPORT

## 2015-06-07 LAB — CBC WITH DIFFERENTIAL/PLATELET
BASOS ABS: 0.1 10*3/uL (ref 0.0–0.2)
Basos: 0 %
EOS (ABSOLUTE): 0.2 10*3/uL (ref 0.0–0.4)
Eos: 1 %
HEMOGLOBIN: 12.3 g/dL (ref 11.1–15.9)
Hematocrit: 37.6 % (ref 34.0–46.6)
IMMATURE GRANS (ABS): 0 10*3/uL (ref 0.0–0.1)
IMMATURE GRANULOCYTES: 0 %
LYMPHS ABS: 3.2 10*3/uL — AB (ref 0.7–3.1)
LYMPHS: 24 %
MCH: 28.4 pg (ref 26.6–33.0)
MCHC: 32.7 g/dL (ref 31.5–35.7)
MCV: 87 fL (ref 79–97)
MONOCYTES: 5 %
Monocytes Absolute: 0.6 10*3/uL (ref 0.1–0.9)
NEUTROS PCT: 70 %
Neutrophils Absolute: 9.1 10*3/uL — ABNORMAL HIGH (ref 1.4–7.0)
Platelets: 359 10*3/uL (ref 150–379)
RBC: 4.33 x10E6/uL (ref 3.77–5.28)
RDW: 13.9 % (ref 12.3–15.4)
WBC: 13.2 10*3/uL — AB (ref 3.4–10.8)

## 2015-06-12 ENCOUNTER — Other Ambulatory Visit: Payer: Self-pay

## 2015-06-14 ENCOUNTER — Ambulatory Visit (INDEPENDENT_AMBULATORY_CARE_PROVIDER_SITE_OTHER): Payer: 59 | Admitting: Psychology

## 2015-06-14 DIAGNOSIS — F332 Major depressive disorder, recurrent severe without psychotic features: Secondary | ICD-10-CM

## 2015-07-02 ENCOUNTER — Other Ambulatory Visit: Payer: Self-pay | Admitting: Family Medicine

## 2015-07-03 NOTE — Telephone Encounter (Signed)
Received refill request electronically Last office visit 05/31/15/[facility] Is it okay to refill medication? Please advise when patient is due a physical?

## 2015-07-19 ENCOUNTER — Ambulatory Visit: Payer: 59 | Admitting: Psychology

## 2015-07-20 ENCOUNTER — Ambulatory Visit: Payer: 59 | Attending: Gynecologic Oncology | Admitting: Gynecologic Oncology

## 2015-07-20 ENCOUNTER — Encounter: Payer: Self-pay | Admitting: Gynecologic Oncology

## 2015-07-20 VITALS — BP 137/62 | HR 72 | Temp 98.4°F | Resp 18 | Ht 63.0 in | Wt 223.5 lb

## 2015-07-20 DIAGNOSIS — R971 Elevated cancer antigen 125 [CA 125]: Secondary | ICD-10-CM

## 2015-07-20 DIAGNOSIS — N83291 Other ovarian cyst, right side: Secondary | ICD-10-CM

## 2015-07-20 DIAGNOSIS — N83299 Other ovarian cyst, unspecified side: Secondary | ICD-10-CM | POA: Insufficient documentation

## 2015-07-20 DIAGNOSIS — N83292 Other ovarian cyst, left side: Secondary | ICD-10-CM

## 2015-07-20 DIAGNOSIS — Z803 Family history of malignant neoplasm of breast: Secondary | ICD-10-CM | POA: Diagnosis not present

## 2015-07-20 NOTE — Patient Instructions (Signed)
We recommend that you contact Dr Sherran Needs office to discuss oophorectomy or close followup of your cysts.

## 2015-07-20 NOTE — Progress Notes (Signed)
Consult Note: Gyn-Onc  Consult was requested by Dr. Radene Knee for the evaluation of Dominique Nunez 50 y.o. female with ovarian cysts and pelvic pain and elevated CA 125  CC:  Chief Complaint  Patient presents with  . complex cyst ovarian    New consult    Assessment/Plan:  Dominique Nunez  is a 50 y.o.  year old with bilateral ovarian cysts and elevated CA 125. They've reviewed the ultrasound reports and CA-125 value performed a history and physical examination. Overall have a fairly low suspicion for malignancy. I believe that the mild elevation CA-125 is likely secondary to a benign cystic process. I also not certain that these ovarian cysts are the cause of the patient's pain, particularly because was her symptoms appear urethral. However I believe it is reasonable to perform a bilateral salpingo-oophorectomy and attempt to both alleviate her symptoms of pain, and confirmed a malignancy is present. Because of myelosis patient for malignancy a believe that it is safe in the small for Dr. Radene Knee to perform the surgery as I feel that surgical staging is unlikely necessary. The patient states that she would prefer for Dr. Radene Knee to perform the surgery if safe, and I stated that I agree with this plan. However if Dr. Radene Knee would prefer that I perform the surgery I would also be happy to do so.  An alternative to performing surgery would be close surveillance of these cysts with repeated imaging in 6-12 weeks and repeated CA-125 evaluation. If the CA-125 was steadily increasing that would present a more concerning picture and I case I would not recommending ongoing surveillance but instead recommend surgical removal and evaluation with pathology.  I discussed with the patient that if her pain is secondary to pelvic adhesive disease that removal of ovaries in performing an additional surgery increases this process and may not alleviate her pain symptoms long-term.  We discussed the reports of  hormone replacement therapy postoperatively. I discussed that there is no increased risk for breast cancer if estrogen replacement therapy is administered in premenopausal age woman after surgical castration. I discussed that we would recommend stopping estrogen replacement therapy at proximal leg age of natural menopause (age 36) or within 5 years of this date. I discussed that failing to add back estrogen replacement in a premenopausal woman is associated with an increase in all cause mortality.   HPI: Dominique Nunez is a very pleasant 50 year old para 2 who is seen in consultation at the request of Dr. Radene Knee for bilateral ovarian cysts and elevated CA-125. The patient has a remote history of a vaginal hysterectomy and urethral sling at age 61 years for vaginal prolapse. She developed symptoms of dyspareunia approximately 3 years ago and underwent a laparoscopy with lysis of adhesions and injection of the vaginal cuff. This ameliorated his symptoms.  Since September 2016 the patient is been experiencing deep dyspareunia, and intermittent suburethral pain. It is not cyclical in nature. She underwent a transvaginal ultrasound with Dr. Radene Knee and 07/02/2015 which revealed a right ovary containing a 2.6 x 1.7 cm complex cyst with septations no blood flow seen within the cyst. The left ovary contained a 2.5 x 1.57 is simple ovarian cyst with no blood flow seen is no free fluid seen. A CA-125 was drawn on 07/02/2015 or slightly elevated at 44 units per milliliter.  The patient has a family history of a mother with breast cancer and multiple other maternal relatives with breast cancer. She is BRCA negative.  Current Meds:  Outpatient Encounter Prescriptions as of 07/20/2015  Medication Sig  . buPROPion (WELLBUTRIN XL) 150 MG 24 hr tablet Take 1 tablet by mouth  daily  . clonazePAM (KLONOPIN) 0.5 MG tablet Take 1 tablet (0.5 mg total) by mouth daily as needed for anxiety.  Marland Kitchen levothyroxine (SYNTHROID,  LEVOTHROID) 75 MCG tablet Take 75 mcg by mouth daily before breakfast.  . rizatriptan (MAXALT) 10 MG tablet Take 1 tablet (10 mg total) by mouth as needed for migraine. May repeat in 2 hours if needed  . sertraline (ZOLOFT) 100 MG tablet Take 1 tablet by mouth  daily  . zolpidem (AMBIEN) 10 MG tablet Take 1 tablet (10 mg total) by mouth at bedtime as needed for sleep.  . Vitamin D, Ergocalciferol, (DRISDOL) 50000 UNITS CAPS capsule Take 1 capsule (50,000 Units total) by mouth every 7 (seven) days. (Patient not taking: Reported on 07/20/2015)  . [DISCONTINUED] meloxicam (MOBIC) 7.5 MG tablet Take 1 tablet (7.5 mg total) by mouth daily. Do not take this with other anti-inflammatories.  . [DISCONTINUED] metroNIDAZOLE (FLAGYL) 500 MG tablet Take 1 tablet (500 mg total) by mouth 2 (two) times daily.  . [DISCONTINUED] predniSONE (DELTASONE) 20 MG tablet 2 tabs by mouth daily x 3 days, 1 tab by mouth daily x 3 days, 1/2 tab by mouth daily x 3 days.   No facility-administered encounter medications on file as of 07/20/2015.    Allergy: No Known Allergies  Social Hx:   Social History   Social History  . Marital Status: Married    Spouse Name: N/A  . Number of Children: N/A  . Years of Education: N/A   Occupational History  . Not on file.   Social History Main Topics  . Smoking status: Never Smoker   . Smokeless tobacco: Never Used  . Alcohol Use: Yes  . Drug Use: No  . Sexual Activity: Yes   Other Topics Concern  . Not on file   Social History Narrative    Past Surgical Hx:  Past Surgical History  Procedure Laterality Date  . Cholecystectomy  2000  . Breast biopsy  1984  . Tonsillectomy and adenoidectomy  1976  . Abdominal hysterectomy  2000    Past Medical Hx:  Past Medical History  Diagnosis Date  . Depression   . History of fainting spells of unknown cause   . GERD (gastroesophageal reflux disease)   . Heart murmur   . Kidney stones   . Thyroid disease   . Headache   .  Migraine     Past Gynecological History:  SVD x 2 No LMP recorded. Patient has had a hysterectomy.  Family Hx:  Family History  Problem Relation Age of Onset  . Cancer Mother   . Ovarian cancer Mother   . Asthma Mother   . COPD Mother   . Alcohol abuse Father   . Kidney disease Father   . Diabetes Father   . Kidney disease Brother   . Hepatitis C Brother   . Stroke Paternal Aunt   . Cancer Paternal Aunt   . Alcohol abuse Maternal Grandfather   . Alcohol abuse Paternal Grandfather   . Arthritis Paternal Grandfather   . Cancer Paternal Grandfather   . Heart disease Paternal Grandfather   . Stroke Paternal Grandfather     Review of Systems:  Constitutional  Feels well,    ENT Normal appearing ears and nares bilaterally Skin/Breast  No rash, sores, jaundice, itching, dryness Cardiovascular  No  chest pain, shortness of breath, or edema  Pulmonary  No cough or wheeze.  Gastro Intestinal  No nausea, vomitting, or diarrhoea. No bright red blood per rectum, no abdominal pain, change in bowel movement, or constipation.  Genito Urinary  No frequency, urgency, dysuria, see HPI Musculo Skeletal  No myalgia, arthralgia, joint swelling or pain  Neurologic  No weakness, numbness, change in gait,  Psychology  No depression, anxiety, insomnia.   Vitals:  Blood pressure 137/62, pulse 72, temperature 98.4 F (36.9 C), temperature source Oral, resp. rate 18, height 5' 3"  (1.6 m), weight 223 lb 8 oz (101.379 kg), SpO2 99 %.  Physical Exam: WD in NAD Neck  Supple NROM, without any enlargements.  Lymph Node Survey No cervical supraclavicular or inguinal adenopathy Cardiovascular  Pulse normal rate, regularity and rhythm. S1 and S2 normal.  Lungs  Clear to auscultation bilateraly, without wheezes/crackles/rhonchi. Good air movement.  Skin  No rash/lesions/breakdown  Psychiatry  Alert and oriented to person, place, and time  Abdomen  Normoactive bowel sounds, abdomen soft,  non-tender and obese without evidence of hernia.  Back No CVA tenderness Genito Urinary  Vulva/vagina: Normal external female genitalia.  No lesions. No discharge or bleeding.  Bladder/urethra:  No lesions or masses, well supported bladder  Vagina: grossly normal  Cervix: surgically absent  Uterus: surgically absent   Adnexa: no palpable masses. Rectal  Good tone, no masses no cul de sac nodularity.  Extremities  No bilateral cyanosis, clubbing or edema.   Donaciano Eva, MD  07/20/2015, 1:02 PM

## 2015-08-02 ENCOUNTER — Other Ambulatory Visit: Payer: Self-pay | Admitting: Family Medicine

## 2015-08-06 ENCOUNTER — Encounter: Payer: Self-pay | Admitting: Family Medicine

## 2015-08-06 ENCOUNTER — Ambulatory Visit: Payer: 59 | Admitting: Psychology

## 2015-08-06 ENCOUNTER — Ambulatory Visit (INDEPENDENT_AMBULATORY_CARE_PROVIDER_SITE_OTHER): Payer: 59 | Admitting: Family Medicine

## 2015-08-06 VITALS — BP 118/70 | HR 77 | Temp 98.3°F | Wt 224.5 lb

## 2015-08-06 DIAGNOSIS — F329 Major depressive disorder, single episode, unspecified: Secondary | ICD-10-CM

## 2015-08-06 DIAGNOSIS — F32A Depression, unspecified: Secondary | ICD-10-CM

## 2015-08-06 MED ORDER — CLONAZEPAM 0.5 MG PO TABS: 0.5000 mg | ORAL_TABLET | Freq: Every day | ORAL | Status: DC | PRN

## 2015-08-06 NOTE — Progress Notes (Signed)
Subjective:   Patient ID: Dominique Nunez, female    DOB: 1966/01/12, 50 y.o.   MRN: [id number]  Dominique Nunez is a pleasant 50 y.o. year old female who presents to clinic today with Follow-up  on 08/06/2015  HPI: Depression with anxiety- feels she has been doing well on zoloft 100 mg daily. Still takes klonipin as needed for panic attacks- no more than a few times a week, often less frequently than that.  Sleeping well.  Appetite good.  No SI or HI.  Current Outpatient Prescriptions on File Prior to Visit  Medication Sig Dispense Refill  . buPROPion (WELLBUTRIN XL) 150 MG 24 hr tablet Take 1 tablet by mouth  daily 90 tablet 0  . levothyroxine (SYNTHROID, LEVOTHROID) 75 MCG tablet Take 75 mcg by mouth daily before breakfast.    . rizatriptan (MAXALT) 10 MG tablet Take 1 tablet (10 mg total) by mouth as needed for migraine. May repeat in 2 hours if needed 10 tablet 0  . sertraline (ZOLOFT) 100 MG tablet Take 1 tablet by mouth  daily 90 tablet 0  . zolpidem (AMBIEN) 10 MG tablet Take 1 tablet (10 mg total) by mouth at bedtime as needed for sleep. 30 tablet 0   No current facility-administered medications on file prior to visit.    No Known Allergies  Past Medical History  Diagnosis Date  . Depression   . History of fainting spells of unknown cause   . GERD (gastroesophageal reflux disease)   . Heart murmur   . Kidney stones   . Thyroid disease   . Headache   . Migraine     Past Surgical History  Procedure Laterality Date  . Cholecystectomy  2000  . Breast biopsy  1984  . Tonsillectomy and adenoidectomy  1976  . Abdominal hysterectomy  2000    Family History  Problem Relation Age of Onset  . Cancer Mother   . Ovarian cancer Mother   . Asthma Mother   . COPD Mother   . Alcohol abuse Father   . Kidney disease Father   . Diabetes Father   . Kidney disease Brother   . Hepatitis C Brother   . Stroke Paternal Aunt   . Cancer Paternal Aunt   . Alcohol abuse  Maternal Grandfather   . Alcohol abuse Paternal Grandfather   . Arthritis Paternal Grandfather   . Cancer Paternal Grandfather   . Heart disease Paternal Grandfather   . Stroke Paternal Grandfather     Social History   Social History  . Marital Status: Married    Spouse Name: N/A  . Number of Children: N/A  . Years of Education: N/A   Occupational History  . Not on file.   Social History Main Topics  . Smoking status: Never Smoker   . Smokeless tobacco: Never Used  . Alcohol Use: Yes  . Drug Use: No  . Sexual Activity: Yes   Other Topics Concern  . Not on file   Social History Narrative   The PMH, PSH, Social History, Family History, Medications, and allergies have been reviewed in [facility], and have been updated if relevant.   Review of Systems  Psychiatric/Behavioral: Negative.   All other systems reviewed and are negative.      Objective:    BP 118/70 mmHg  Pulse 77  Temp(Src) 98.3 F (36.8 C) (Oral)  Wt 224 lb 8 oz (101.833 kg)  SpO2 98%   Physical Exam  Constitutional: She is oriented  to person, place, and time. She appears well-developed and well-nourished. No distress.  HENT:  Head: Normocephalic.  Eyes: Conjunctivae are normal.  Cardiovascular: Normal rate.   Pulmonary/Chest: Effort normal.  Musculoskeletal: Normal range of motion.  Neurological: She is alert and oriented to person, place, and time. No cranial nerve deficit.  Skin: Skin is warm and dry. She is not diaphoretic.  Psychiatric: She has a normal mood and affect. Her behavior is normal. Judgment and thought content normal.  Nursing note and vitals reviewed.         Assessment & Plan:   Depression No Follow-up on file.

## 2015-08-06 NOTE — Assessment & Plan Note (Signed)
>  15 minutes spent in face to face time with patient, >50% spent in counselling or coordination of care Stable on current rxs. Klonipin rx printed and given to pt. Follow up in 1 year or sooner as needed. The patient indicates understanding of these issues and agrees with the plan.

## 2015-08-06 NOTE — Progress Notes (Signed)
Pre visit review using our clinic review tool, if applicable. No additional management support is needed unless otherwise documented below in the visit note. 

## 2015-08-17 ENCOUNTER — Other Ambulatory Visit: Payer: Self-pay | Admitting: Family Medicine

## 2015-08-28 ENCOUNTER — Other Ambulatory Visit: Payer: Self-pay | Admitting: Family Medicine

## 2015-09-05 ENCOUNTER — Other Ambulatory Visit: Payer: Self-pay | Admitting: Family Medicine

## 2015-09-05 NOTE — Telephone Encounter (Signed)
Last f/u 07/2015 

## 2015-09-05 NOTE — Telephone Encounter (Signed)
Rx called in to requested pharmacy 

## 2015-09-13 ENCOUNTER — Ambulatory Visit (INDEPENDENT_AMBULATORY_CARE_PROVIDER_SITE_OTHER): Payer: 59 | Admitting: Psychology

## 2015-09-13 DIAGNOSIS — F332 Major depressive disorder, recurrent severe without psychotic features: Secondary | ICD-10-CM

## 2015-09-14 ENCOUNTER — Other Ambulatory Visit: Payer: Self-pay | Admitting: Family Medicine

## 2015-10-12 ENCOUNTER — Ambulatory Visit (INDEPENDENT_AMBULATORY_CARE_PROVIDER_SITE_OTHER): Payer: 59 | Admitting: Psychology

## 2015-10-12 DIAGNOSIS — F419 Anxiety disorder, unspecified: Secondary | ICD-10-CM | POA: Diagnosis not present

## 2015-10-12 DIAGNOSIS — F331 Major depressive disorder, recurrent, moderate: Secondary | ICD-10-CM | POA: Diagnosis not present

## 2015-11-16 IMAGING — CR DG LUMBAR SPINE COMPLETE 4+V
5 series · 5 of 5 positions shown · non-contrast
Comparison: None.

CLINICAL DATA: Sciatica, intermittent low back pain

EXAM:
LUMBAR SPINE - COMPLETE 4+ VIEW

[view not recorded (1 of 5)]
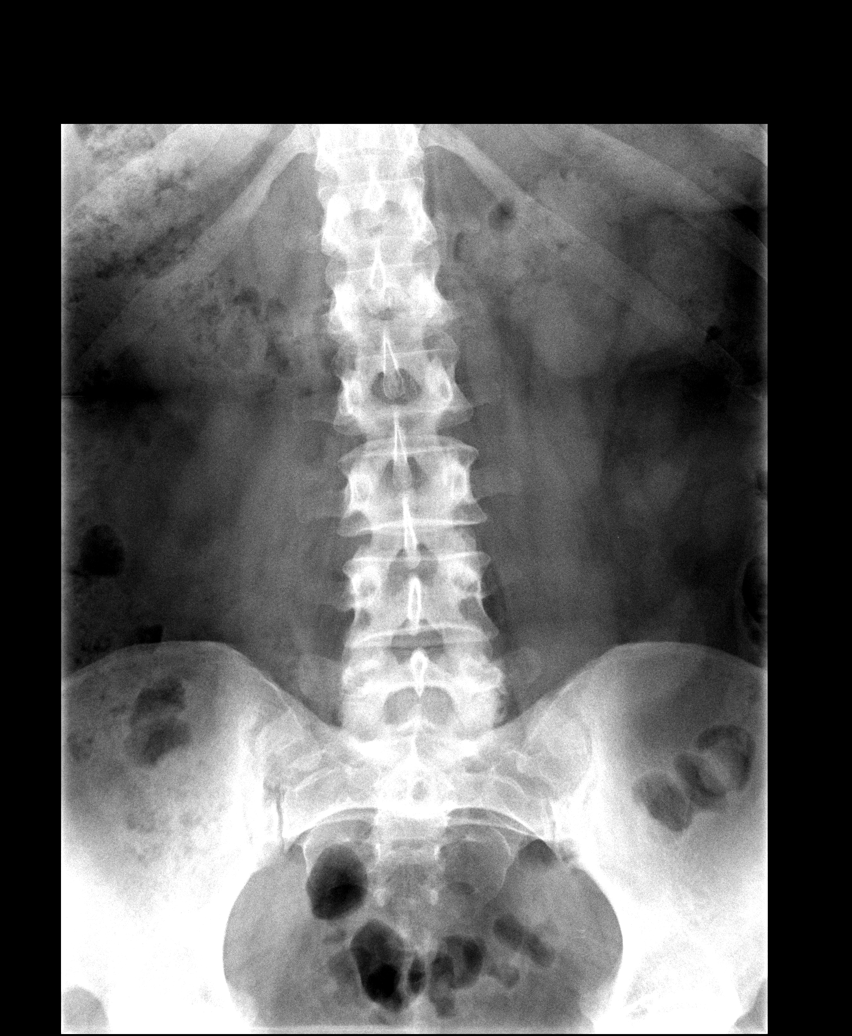

[view not recorded (2 of 5)]
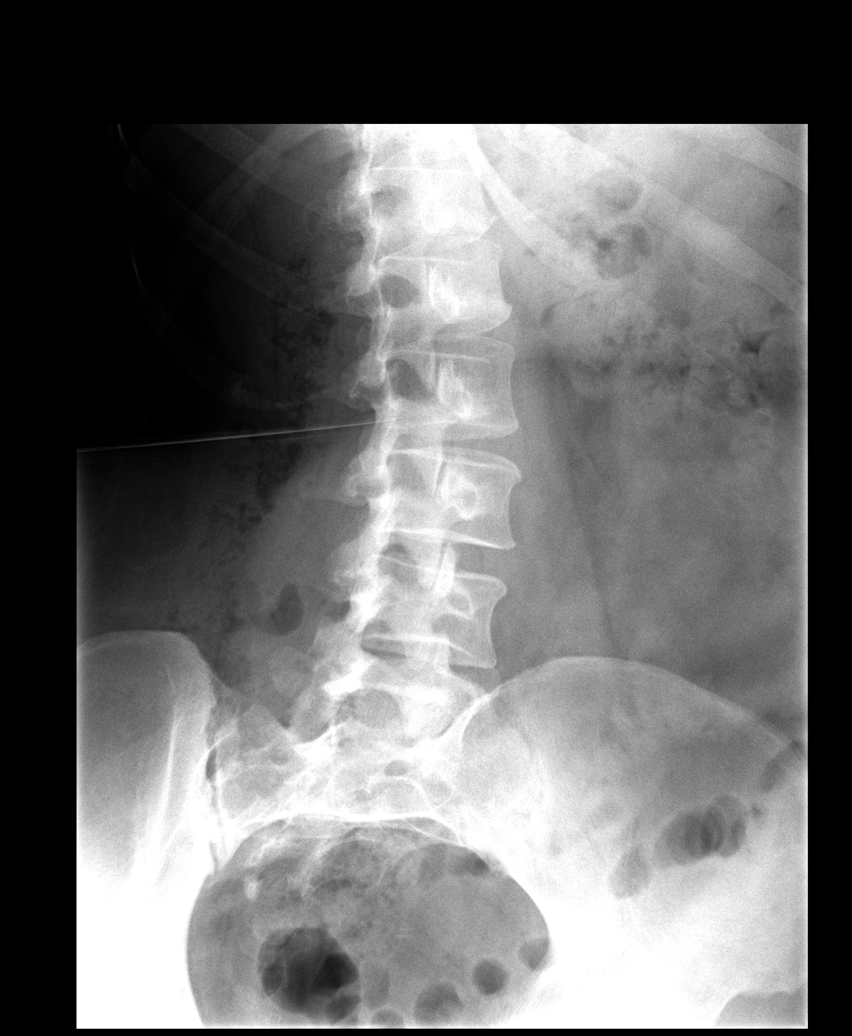

[view not recorded (3 of 5)]
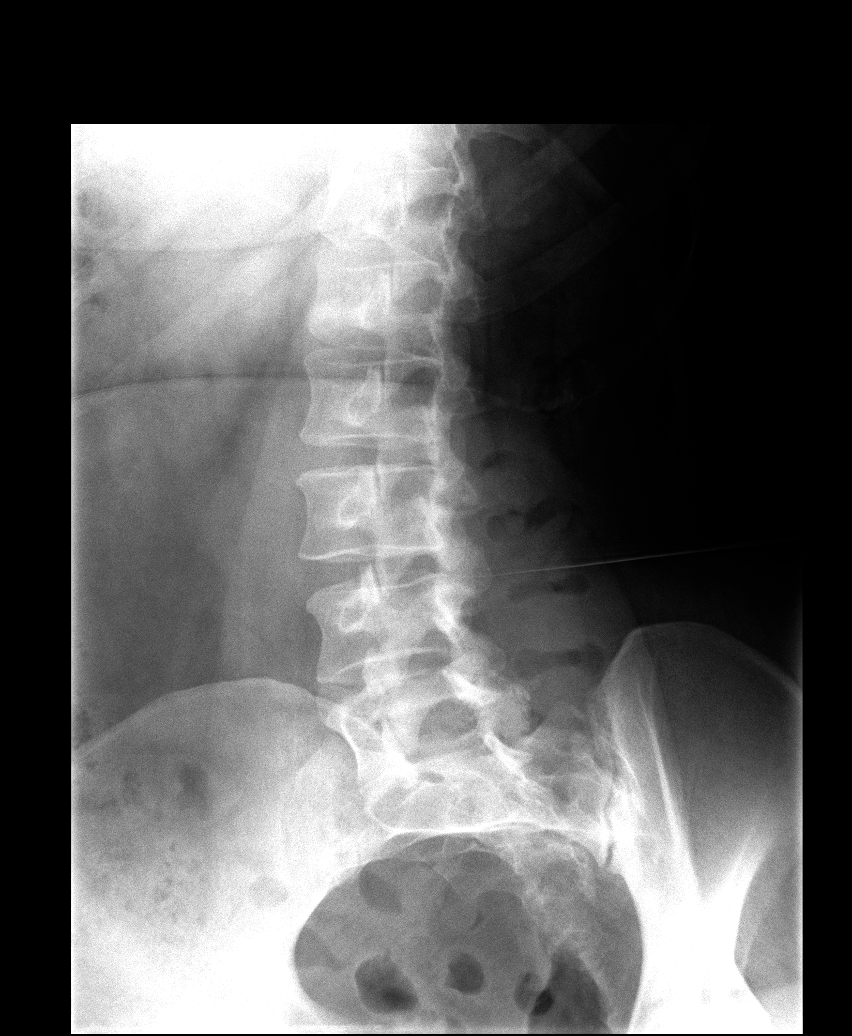

[view not recorded (4 of 5)]
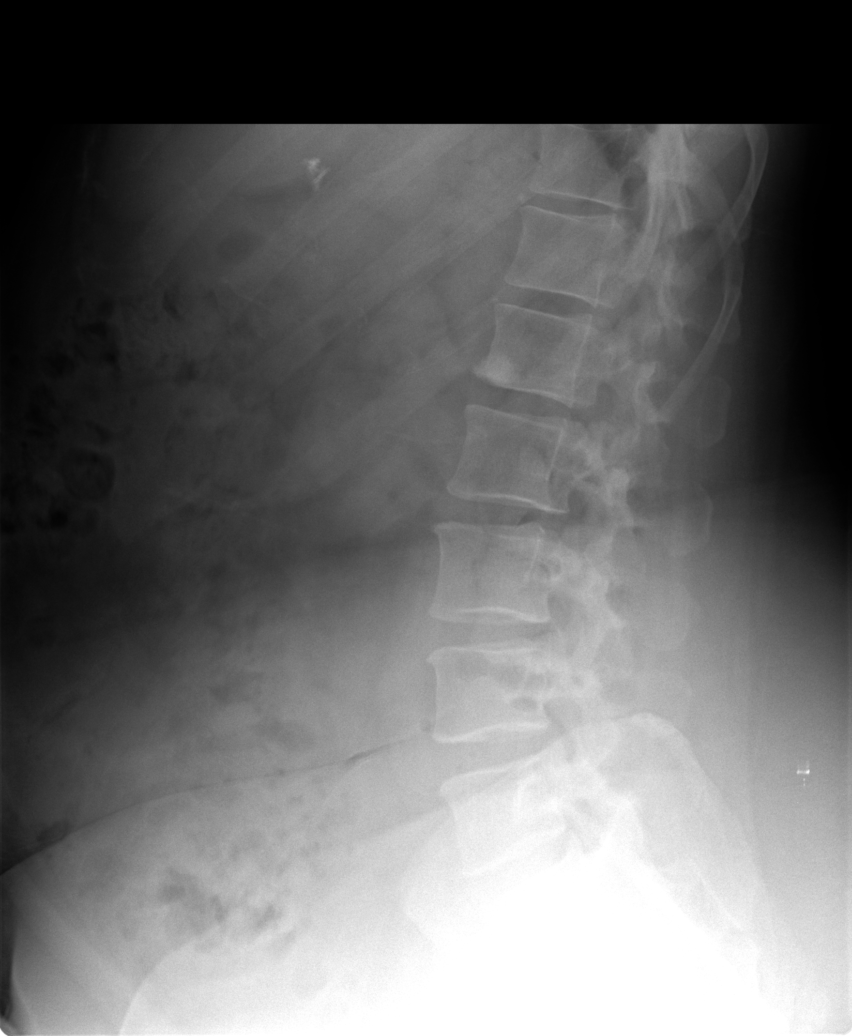

[view not recorded (5 of 5)]
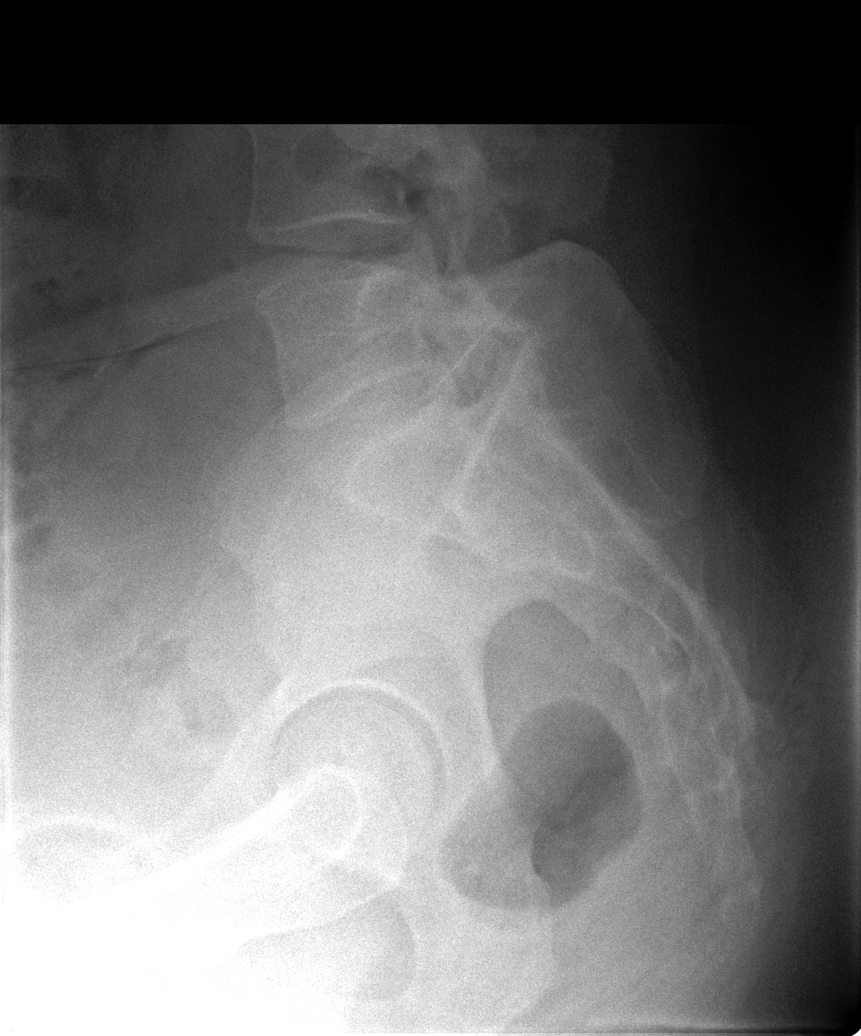

[5 of 5 positions shown; findings below may reference images not displayed]

FINDINGS: Five views of lumbar spine submitted. No acute fracture or
subluxation. Mild anterior spurring upper endplate of L3 and L4
vertebral body. Mild anterior spurring lower endplate of L1
vertebral body. Minimal facet degenerative changes L4 and L5 level.
IMPRESSION: No acute fracture or subluxation.  Mild degenerative changes.

## 2015-11-19 ENCOUNTER — Encounter: Payer: Self-pay | Admitting: Family Medicine

## 2015-11-19 ENCOUNTER — Ambulatory Visit (INDEPENDENT_AMBULATORY_CARE_PROVIDER_SITE_OTHER): Payer: 59 | Admitting: Family Medicine

## 2015-11-19 VITALS — BP 124/64 | HR 69 | Temp 98.1°F | Wt 228.5 lb

## 2015-11-19 DIAGNOSIS — F329 Major depressive disorder, single episode, unspecified: Secondary | ICD-10-CM

## 2015-11-19 DIAGNOSIS — F32A Depression, unspecified: Secondary | ICD-10-CM

## 2015-11-19 MED ORDER — BUPROPION HCL ER (XL) 300 MG PO TB24: 300.0000 mg | ORAL_TABLET | Freq: Every day | ORAL | Status: DC

## 2015-11-19 NOTE — Progress Notes (Signed)
Pre visit review using our clinic review tool, if applicable. No additional management support is needed unless otherwise documented below in the visit note. 

## 2015-11-19 NOTE — Progress Notes (Signed)
Subjective:   Patient ID: Dominique Nunez, female    DOB: 08-02-1965, 50 y.o.   MRN: [id number]  Dominique Nunez is a pleasant 50 y.o. year old female who presents to clinic today with Follow-up  on 11/19/2015  HPI:  Depression- had been doing well on Zoloft 100 mg daily until past few months.  Having increased anhedonia and anxiety. Denies SI or HI. Also takes Wellbutrin 150 mg XL daily.  She also felt like the Wellbutrin worked well initially but no longer working as well either.  Notices that she doesn't want to get out of bed and do the things she normally does.  Wants to come home and go to bed after work.  No SI or HI.  Current Outpatient Prescriptions on File Prior to Visit  Medication Sig Dispense Refill  . clonazePAM (KLONOPIN) 0.5 MG tablet TAKE 1 TABLET BY MOUTH DAILY AS NEEDED FOR ANXIETY 30 tablet 0  . levothyroxine (SYNTHROID, LEVOTHROID) 75 MCG tablet Take 75 mcg by mouth daily before breakfast.    . rizatriptan (MAXALT) 10 MG tablet Take 1 tablet (10 mg total) by mouth as needed for migraine. May repeat in 2 hours if needed 10 tablet 0  . sertraline (ZOLOFT) 100 MG tablet Take 1 tablet by mouth  daily 90 tablet 2  . zolpidem (AMBIEN) 10 MG tablet Take 1 tablet (10 mg total) by mouth at bedtime as needed for sleep. 30 tablet 0   No current facility-administered medications on file prior to visit.    No Known Allergies  Past Medical History  Diagnosis Date  . Depression   . History of fainting spells of unknown cause   . GERD (gastroesophageal reflux disease)   . Heart murmur   . Kidney stones   . Thyroid disease   . Headache   . Migraine     Past Surgical History  Procedure Laterality Date  . Cholecystectomy  2000  . Breast biopsy  1984  . Tonsillectomy and adenoidectomy  1976  . Abdominal hysterectomy  2000    Family History  Problem Relation Age of Onset  . Cancer Mother   . Ovarian cancer Mother   . Asthma Mother   . COPD Mother   .  Alcohol abuse Father   . Kidney disease Father   . Diabetes Father   . Kidney disease Brother   . Hepatitis C Brother   . Stroke Paternal Aunt   . Cancer Paternal Aunt   . Alcohol abuse Maternal Grandfather   . Alcohol abuse Paternal Grandfather   . Arthritis Paternal Grandfather   . Cancer Paternal Grandfather   . Heart disease Paternal Grandfather   . Stroke Paternal Grandfather     Social History   Social History  . Marital Status: Married    Spouse Name: N/A  . Number of Children: N/A  . Years of Education: N/A   Occupational History  . Not on file.   Social History Main Topics  . Smoking status: Never Smoker   . Smokeless tobacco: Never Used  . Alcohol Use: Yes  . Drug Use: No  . Sexual Activity: Yes   Other Topics Concern  . Not on file   Social History Narrative   The PMH, PSH, Social History, Family History, Medications, and allergies have been reviewed in [facility], and have been updated if relevant.     Review of Systems  Musculoskeletal: Positive for arthralgias.  Psychiatric/Behavioral: Positive for dysphoric mood and decreased concentration. Negative  for suicidal ideas, hallucinations, sleep disturbance and self-injury. The patient is nervous/anxious. The patient is not hyperactive.   All other systems reviewed and are negative.      Objective:    BP 124/64 mmHg  Pulse 69  Temp(Src) 98.1 F (36.7 C) (Oral)  Wt 228 lb 8 oz (103.647 kg)  SpO2 97%   Physical Exam  Constitutional: She is oriented to person, place, and time. She appears well-developed and well-nourished. No distress.  HENT:  Head: Normocephalic and atraumatic.  Eyes: Conjunctivae are normal.  Cardiovascular: Normal rate.   Pulmonary/Chest: Effort normal.  Musculoskeletal: Normal range of motion.  Neurological: She is alert and oriented to person, place, and time. No cranial nerve deficit.  Skin: Skin is warm and dry. She is not diaphoretic.  Psychiatric: She has a normal mood  and affect. Her behavior is normal. Judgment and thought content normal.  Nursing note and vitals reviewed.         Assessment & Plan:   Depression No Follow-up on file.

## 2015-11-19 NOTE — Assessment & Plan Note (Signed)
Deteriorated. >25 minutes spent in face to face time with patient, >50% spent in counselling or coordination of care Increase Wellbutrin to 300 mg XL daily, continue zoloft at current dose. Follow up in a few weeks. The patient indicates understanding of these issues and agrees with the plan.

## 2015-11-19 NOTE — Patient Instructions (Signed)
Great to see you. We are increasing your Wellbutrin to 300 mg XL daily.  Please keep me updated.

## 2015-11-22 ENCOUNTER — Telehealth: Payer: Self-pay

## 2015-11-22 NOTE — Telephone Encounter (Signed)
Ok to write letter as pt requested.

## 2015-11-22 NOTE — Telephone Encounter (Signed)
Pt left v/m; pt works at [employer] will supply an apparatus that will raise pts computer and keyboard if pt has a letter from PCP ordering this elevated desk. Pt has problems with arthritis and that is the reason she is requesting this so pt can sit or stand and continue working. Pt request cb.

## 2015-11-23 NOTE — Telephone Encounter (Signed)
Arthritis is not currently on pts problem list. Ok to write per Dr Deborra Medina. Lm on pts vm and informed her letter is available for pickup from the front desk.

## 2016-03-04 ENCOUNTER — Ambulatory Visit (INDEPENDENT_AMBULATORY_CARE_PROVIDER_SITE_OTHER): Payer: 59 | Admitting: Family Medicine

## 2016-03-04 ENCOUNTER — Encounter: Payer: Self-pay | Admitting: Family Medicine

## 2016-03-04 VITALS — BP 124/68 | HR 76 | Temp 98.3°F | Wt 232.8 lb

## 2016-03-04 DIAGNOSIS — R5383 Other fatigue: Secondary | ICD-10-CM | POA: Diagnosis not present

## 2016-03-04 DIAGNOSIS — E039 Hypothyroidism, unspecified: Secondary | ICD-10-CM

## 2016-03-04 DIAGNOSIS — E559 Vitamin D deficiency, unspecified: Secondary | ICD-10-CM | POA: Diagnosis not present

## 2016-03-04 DIAGNOSIS — R609 Edema, unspecified: Secondary | ICD-10-CM | POA: Insufficient documentation

## 2016-03-04 NOTE — Progress Notes (Signed)
Subjective:   Patient ID: Dominique Nunez, female    DOB: 24-Feb-1966, 50 y.o.   MRN: [id number]  Dominique Nunez is a pleasant 50 y.o. year old female with h/o hypothyroidism, depression and vit D deficiency, who presents to clinic today with Fatigue and Edema (hands and ankles)  on 03/04/2016  HPI:  Increased fatigue over past few weeks and now noticing that her hands and ankles are more swollen.  Also feels she is gaining weight despite exercising more.  No CP or SOB.  No changes in her bowel or hair.  No difficulty swallowing.  LE edema does seem to improve in the mornings.  Lab Results  Component Value Date   TSH 3.940 02/14/2015   Lab Results  Component Value Date   WBC 13.2 (H) 05/31/2015   HGB 13.2 08/02/2009   HCT 37.6 05/31/2015   MCV 87 05/31/2015   PLT 359 05/31/2015   Lab Results  Component Value Date   NA 143 05/31/2015   K 4.2 05/31/2015   CL 103 05/31/2015   CO2 21 05/31/2015   Lab Results  Component Value Date   VITAMINB12 371 12/26/2014   Current Outpatient Prescriptions on File Prior to Visit  Medication Sig Dispense Refill  . buPROPion (WELLBUTRIN XL) 300 MG 24 hr tablet Take 1 tablet (300 mg total) by mouth daily. 90 tablet 3  . clonazePAM (KLONOPIN) 0.5 MG tablet TAKE 1 TABLET BY MOUTH DAILY AS NEEDED FOR ANXIETY 30 tablet 0  . levothyroxine (SYNTHROID, LEVOTHROID) 75 MCG tablet Take 75 mcg by mouth daily before breakfast.    . rizatriptan (MAXALT) 10 MG tablet Take 1 tablet (10 mg total) by mouth as needed for migraine. May repeat in 2 hours if needed 10 tablet 0  . sertraline (ZOLOFT) 100 MG tablet Take 1 tablet by mouth  daily 90 tablet 2  . zolpidem (AMBIEN) 10 MG tablet Take 1 tablet (10 mg total) by mouth at bedtime as needed for sleep. 30 tablet 0   No current facility-administered medications on file prior to visit.     No Known Allergies  Past Medical History:  Diagnosis Date  . Depression   . GERD (gastroesophageal reflux  disease)   . Headache   . Heart murmur   . History of fainting spells of unknown cause   . Kidney stones   . Migraine   . Thyroid disease     Past Surgical History:  Procedure Laterality Date  . ABDOMINAL HYSTERECTOMY  2000  . BREAST BIOPSY  1984  . CHOLECYSTECTOMY  2000  . TONSILLECTOMY AND ADENOIDECTOMY  1976    Family History  Problem Relation Age of Onset  . Cancer Mother   . Ovarian cancer Mother   . Asthma Mother   . COPD Mother   . Alcohol abuse Father   . Kidney disease Father   . Diabetes Father   . Kidney disease Brother   . Hepatitis C Brother   . Stroke Paternal Aunt   . Cancer Paternal Aunt   . Alcohol abuse Maternal Grandfather   . Alcohol abuse Paternal Grandfather   . Arthritis Paternal Grandfather   . Cancer Paternal Grandfather   . Heart disease Paternal Grandfather   . Stroke Paternal Grandfather     Social History   Social History  . Marital status: Married    Spouse name: N/A  . Number of children: N/A  . Years of education: N/A   Occupational History  . Not  on file.   Social History Main Topics  . Smoking status: Never Smoker  . Smokeless tobacco: Never Used  . Alcohol use Yes  . Drug use: No  . Sexual activity: Yes   Other Topics Concern  . Not on file   Social History Narrative  . No narrative on file   The PMH, PSH, Social History, Family History, Medications, and allergies have been reviewed in [facility], and have been updated if relevant.   Review of Systems  Constitutional: Positive for fatigue and unexpected weight change.  Respiratory: Negative.   Cardiovascular: Positive for leg swelling.  Gastrointestinal: Negative.   Endocrine: Negative.   Musculoskeletal: Negative.   Neurological: Negative.   Hematological: Negative.   Psychiatric/Behavioral: Negative.   All other systems reviewed and are negative.      Objective:    BP 124/68   Pulse 76   Temp 98.3 F (36.8 C) (Oral)   Wt 232 lb 12 oz (105.6 kg)   SpO2  98%   BMI 41.23 kg/m   Wt Readings from Last 3 Encounters:  03/04/16 232 lb 12 oz (105.6 kg)  11/19/15 228 lb 8 oz (103.6 kg)  08/06/15 224 lb 8 oz (101.8 kg)    Physical Exam  Constitutional: She is oriented to person, place, and time. She appears well-developed and well-nourished. No distress.  HENT:  Head: Normocephalic.  Eyes: Conjunctivae are normal.  Neck: Neck supple. No thyromegaly present.  Cardiovascular: Normal rate and regular rhythm.   Pulmonary/Chest: Effort normal and breath sounds normal.  Musculoskeletal: Normal range of motion.  Trace pedal edema bilaterally  Neurological: She is alert and oriented to person, place, and time. No cranial nerve deficit.  Skin: Skin is dry. She is not diaphoretic.  Psychiatric: She has a normal mood and affect. Her behavior is normal. Judgment and thought content normal.  Nursing note and vitals reviewed.         Assessment & Plan:   Other fatigue  Hypothyroidism, unspecified hypothyroidism type  Vitamin D deficiency  Edema, unspecified type No Follow-up on file.

## 2016-03-04 NOTE — Progress Notes (Signed)
Pre visit review using our clinic review tool, if applicable. No additional management support is needed unless otherwise documented below in the visit note. 

## 2016-03-04 NOTE — Patient Instructions (Signed)
Great to see you, Dominique Nunez. I will call you with your results from today.

## 2016-03-04 NOTE — Assessment & Plan Note (Signed)
New- only trace edema on exam. She has gained a few pounds as well. Will check labs. The patient indicates understanding of these issues and agrees with the plan.

## 2016-03-04 NOTE — Assessment & Plan Note (Signed)
Deteriorated and likely multifactorial. Due for thyroid and other labs today. Check labs as part of initial work up. The patient indicates understanding of these issues and agrees with the plan.  Orders Placed This Encounter  Procedures  . TSH  . T4, Free  . T3, Free  . Brain natriuretic peptide  . Vitamin D, 25-hydroxy  . Vitamin B12

## 2016-03-05 ENCOUNTER — Other Ambulatory Visit: Payer: Self-pay | Admitting: Family Medicine

## 2016-03-05 LAB — T4, FREE: Free T4: 1.25 ng/dL (ref 0.82–1.77)

## 2016-03-05 LAB — VITAMIN B12: Vitamin B-12: 565 pg/mL (ref 211–946)

## 2016-03-05 LAB — T3, FREE: T3, Free: 2.7 pg/mL (ref 2.0–4.4)

## 2016-03-05 LAB — VITAMIN D 25 HYDROXY (VIT D DEFICIENCY, FRACTURES): Vit D, 25-Hydroxy: 29 ng/mL — ABNORMAL LOW (ref 30.0–100.0)

## 2016-03-05 LAB — TSH: TSH: 5.08 u[IU]/mL — AB (ref 0.450–4.500)

## 2016-03-05 LAB — BRAIN NATRIURETIC PEPTIDE: BNP: 16.3 pg/mL (ref 0.0–100.0)

## 2016-03-05 MED ORDER — LEVOTHYROXINE SODIUM 88 MCG PO TABS: 88.0000 ug | ORAL_TABLET | Freq: Every day | ORAL | 3 refills | Status: DC

## 2016-05-22 ENCOUNTER — Ambulatory Visit (INDEPENDENT_AMBULATORY_CARE_PROVIDER_SITE_OTHER): Payer: 59 | Admitting: Family Medicine

## 2016-05-22 ENCOUNTER — Encounter: Payer: Self-pay | Admitting: Family Medicine

## 2016-05-22 DIAGNOSIS — G8929 Other chronic pain: Secondary | ICD-10-CM | POA: Diagnosis not present

## 2016-05-22 DIAGNOSIS — M25552 Pain in left hip: Secondary | ICD-10-CM | POA: Diagnosis not present

## 2016-05-22 DIAGNOSIS — M25551 Pain in right hip: Secondary | ICD-10-CM | POA: Diagnosis not present

## 2016-05-22 DIAGNOSIS — M25561 Pain in right knee: Secondary | ICD-10-CM | POA: Diagnosis not present

## 2016-05-22 DIAGNOSIS — M25562 Pain in left knee: Secondary | ICD-10-CM

## 2016-05-22 NOTE — Assessment & Plan Note (Signed)
Deteriorated. Will check rheum labs today, likely OA. The patient indicates understanding of these issues and agrees with the plan.

## 2016-05-22 NOTE — Progress Notes (Signed)
Pre visit review using our clinic review tool, if applicable. No additional management support is needed unless otherwise documented below in the visit note. 

## 2016-05-22 NOTE — Addendum Note (Signed)
Addended by: Ellamae Sia on: 05/22/2016 09:18 AM   Modules accepted: Orders

## 2016-05-22 NOTE — Progress Notes (Signed)
Subjective:   Patient ID: Dominique Nunez, female    DOB: 04-18-1966, 50 y.o.   MRN: [id number]  Dominique Nunez is a pleasant 50 y.o. year old female who presents to clinic today with Arthritis (knees and elbows)  on 05/22/2016  HPI:  Has had arthritis in her hips and knees for years.  Now her hands and feet are starting to hurt.  She actually had to change what type of pen she uses.  Denies any UE weakness or numbness.    Tylenol and Ibuprofen help but only minimally.  Current Outpatient Prescriptions on File Prior to Visit  Medication Sig Dispense Refill  . buPROPion (WELLBUTRIN XL) 300 MG 24 hr tablet Take 1 tablet (300 mg total) by mouth daily. 90 tablet 3  . clonazePAM (KLONOPIN) 0.5 MG tablet TAKE 1 TABLET BY MOUTH DAILY AS NEEDED FOR ANXIETY 30 tablet 0  . levothyroxine (SYNTHROID, LEVOTHROID) 88 MCG tablet Take 1 tablet (88 mcg total) by mouth daily. 90 tablet 3  . rizatriptan (MAXALT) 10 MG tablet Take 1 tablet (10 mg total) by mouth as needed for migraine. May repeat in 2 hours if needed 10 tablet 0  . sertraline (ZOLOFT) 100 MG tablet Take 1 tablet by mouth  daily 90 tablet 2  . zolpidem (AMBIEN) 10 MG tablet Take 1 tablet (10 mg total) by mouth at bedtime as needed for sleep. 30 tablet 0   No current facility-administered medications on file prior to visit.     No Known Allergies  Past Medical History:  Diagnosis Date  . Depression   . GERD (gastroesophageal reflux disease)   . Headache   . Heart murmur   . History of fainting spells of unknown cause   . Kidney stones   . Migraine   . Thyroid disease     Past Surgical History:  Procedure Laterality Date  . ABDOMINAL HYSTERECTOMY  2000  . BREAST BIOPSY  1984  . CHOLECYSTECTOMY  2000  . TONSILLECTOMY AND ADENOIDECTOMY  1976    Family History  Problem Relation Age of Onset  . Cancer Mother   . Ovarian cancer Mother   . Asthma Mother   . COPD Mother   . Alcohol abuse Father   . Kidney disease  Father   . Diabetes Father   . Kidney disease Brother   . Hepatitis C Brother   . Stroke Paternal Aunt   . Cancer Paternal Aunt   . Alcohol abuse Maternal Grandfather   . Alcohol abuse Paternal Grandfather   . Arthritis Paternal Grandfather   . Cancer Paternal Grandfather   . Heart disease Paternal Grandfather   . Stroke Paternal Grandfather     Social History   Social History  . Marital status: Married    Spouse name: N/A  . Number of children: N/A  . Years of education: N/A   Occupational History  . Not on file.   Social History Main Topics  . Smoking status: Never Smoker  . Smokeless tobacco: Never Used  . Alcohol use Yes  . Drug use: No  . Sexual activity: Yes   Other Topics Concern  . Not on file   Social History Narrative  . No narrative on file   The PMH, PSH, Social History, Family History, Medications, and allergies have been reviewed in [facility], and have been updated if relevant.   Review of Systems  Musculoskeletal: Positive for arthralgias.  Neurological: Negative.   Hematological: Negative.   Psychiatric/Behavioral: Negative.  All other systems reviewed and are negative.      Objective:    BP 118/64   Pulse 78   Temp 98.1 F (36.7 C) (Oral)   Wt 238 lb (108 kg)   SpO2 98%   BMI 42.16 kg/m    Physical Exam  Constitutional: She is oriented to person, place, and time. She appears well-developed and well-nourished. No distress.  HENT:  Head: Normocephalic.  Eyes: Conjunctivae are normal.  Cardiovascular: Normal rate and regular rhythm.   Pulmonary/Chest: Effort normal.  Musculoskeletal: Normal range of motion. She exhibits no edema.  Neurological: She is alert and oriented to person, place, and time. No cranial nerve deficit.  Skin: Skin is warm and dry. She is not diaphoretic.  Psychiatric: She has a normal mood and affect. Her behavior is normal. Judgment and thought content normal.  Nursing note and vitals reviewed.           Assessment & Plan:   Chronic arthralgias of knees and hips - Plan: Rheumatoid factor, Sedimentation Rate, C-reactive protein, Comprehensive metabolic panel, Cyclic Citrul Peptide Antibody, IGG No Follow-up on file.

## 2016-05-22 NOTE — Patient Instructions (Signed)
Good to see you. I will call you with your results.

## 2016-05-24 LAB — COMPREHENSIVE METABOLIC PANEL
A/G RATIO: 1.6 (ref 1.2–2.2)
ALK PHOS: 88 IU/L (ref 39–117)
ALT: 21 IU/L (ref 0–32)
AST: 15 IU/L (ref 0–40)
Albumin: 3.9 g/dL (ref 3.5–5.5)
BILIRUBIN TOTAL: 0.4 mg/dL (ref 0.0–1.2)
BUN/Creatinine Ratio: 12 (ref 9–23)
BUN: 11 mg/dL (ref 6–24)
CHLORIDE: 99 mmol/L (ref 96–106)
CO2: 22 mmol/L (ref 18–29)
Calcium: 9.5 mg/dL (ref 8.7–10.2)
Creatinine, Ser: 0.9 mg/dL (ref 0.57–1.00)
GFR calc Af Amer: 87 mL/min/{1.73_m2} (ref >59)
GFR, EST NON AFRICAN AMERICAN: 76 mL/min/{1.73_m2} (ref >59)
GLOBULIN, TOTAL: 2.5 g/dL (ref 1.5–4.5)
Glucose: 119 mg/dL — ABNORMAL HIGH (ref 65–99)
POTASSIUM: 5 mmol/L (ref 3.5–5.2)
SODIUM: 138 mmol/L (ref 134–144)
Total Protein: 6.4 g/dL (ref 6.0–8.5)

## 2016-05-24 LAB — CYCLIC CITRUL PEPTIDE ANTIBODY, IGG/IGA: Cyclic Citrullin Peptide Ab: 4 units (ref 0–19)

## 2016-05-24 LAB — SEDIMENTATION RATE: Sed Rate: 24 mm/hr (ref 0–32)

## 2016-05-24 LAB — HIGH SENSITIVITY CRP: CRP, High Sensitivity: 18.42 mg/L — ABNORMAL HIGH (ref 0.00–3.00)

## 2016-05-24 LAB — RHEUMATOID FACTOR

## 2016-05-28 ENCOUNTER — Other Ambulatory Visit: Payer: Self-pay | Admitting: Family Medicine

## 2016-05-28 ENCOUNTER — Telehealth: Payer: Self-pay | Admitting: Family Medicine

## 2016-05-28 DIAGNOSIS — M2550 Pain in unspecified joint: Secondary | ICD-10-CM

## 2016-05-28 DIAGNOSIS — R7982 Elevated C-reactive protein (CRP): Secondary | ICD-10-CM

## 2016-05-28 NOTE — Telephone Encounter (Signed)
See results note. 

## 2016-05-28 NOTE — Telephone Encounter (Signed)
Patient called to get her lab results.  Patient said she can be reached at her work number after 11:10.

## 2016-06-04 NOTE — H&P (Signed)
Patient name  Dominique, Nunez DICTATION#  [id number] CSN# [id number]  [facility], MD 06/04/2016 7:38 AM

## 2016-06-04 NOTE — H&P (Unsigned)
NAMEMILLIAN, VESTAL NO.:  [id number]  MEDICAL RECORD NO.:  [id number]  LOCATION:                                 FACILITY:  PHYSICIAN:  Darlyn Chamber, M.D.   DATE OF BIRTH:  1966/04/19  DATE OF ADMISSION: DATE OF DISCHARGE:                             HISTORY & PHYSICAL   ESTIMATED DATE OF SURGERY:  06/26/2016.  HISTORY OF PRESENT ILLNESS:  The patient is a 50 year old gravida 2, para 2, married female who presents for laparoscopic bilateral salpingo- oophorectomy.  It is of note, in the year 2000, she had a total vaginal hysterectomy with A and P repair and a mid urethral sling.  Because of continued pain and discomfort with intercourse, in 2011 she underwent a laparoscopic lysis of adhesions and injection of the vaginal cuff.  She was doing well until relatively recently when she had increasing pain and discomfort particularly with intercourse.  She underwent an ultrasound that looked like ovaries were somewhat adherent to the vaginal cuff.  Now, she presents for laparoscopic attempt to removal of both tubes and ovaries and possible need for exploratory surgery. Cystoscopy will be performed also.  ALLERGIES:  She has no known drug allergies.  MEDICATIONS:  She is on levothyroxine.  She takes Zoloft as well as Klonopin.  Use Ambien as needed for sleep.  PAST MEDICAL HISTORY:  She has had usual childhood diseases without sequelae.  PAST SURGICAL HISTORY:  She had a cholecystectomy in the year 2000.  She has had a previous tonsillectomy.  She has had a myringotomy.  She has had breast biopsies, tooth extraction, previous total vaginal hysterectomy with A and P repair and mid urethral sling, and subsequent laparoscopic lysis of adhesions.  She has had 2 vaginal deliveries.  SOCIAL HISTORY:  No tobacco or alcohol use.  FAMILY HISTORY:  Mother with history of breast cancer.  Maternal grandmother with history of breast cancer.  The patient did  undergo __________ testing, which was negative.  REVIEW OF SYSTEMS:  Noncontributory.  PHYSICAL EXAMINATION:  VITAL SIGNS:  The patient is afebrile with stable vital signs. HEENT:  The patient is normocephalic.  Pupils equal, round, reactive to light and accommodation.  Extraocular movements were intact.  Sclerae and conjunctivae were clear. NECK:  Without thyromegaly. BREASTS:  Not examined. LUNGS:  Clear. CARDIOVASCULAR:  Regular rate.  No murmurs or gallops. ABDOMEN:  Benign.  No mass, organomegaly, or tenderness. PELVIC:  Normal external genitalia.  Vaginal mucosa is clear.  Cuff intact.  Does have tenderness on exam.  No masses appreciated. EXTREMITIES:  Trace edema. NEUROLOGIC:  __________ normal limits.  IMPRESSION:  Continued pelvic pain and dyspareunia possibly secondary to pelvic adhesions.  PLAN:  The patient will undergo laparoscopic attempt to removal of both tubes and ovaries.  She does understand the possible need for exploratory surgery.  We are going to perform cystoscopy to make sure ureters are not injured.  Risks of surgery have been explained including the risk of infection.  The risk of hemorrhage that could require transfusion with the risk of AIDS or hepatitis.  Risk of injury to adjacent organs including bladder, bowel, ureters that  could require further exploratory surgery.  Risk of deep venous thrombosis and pulmonary embolus.  The patient expressed understanding of the indications and risks.     Darlyn Chamber, M.D.   ______________________________ Darlyn Chamber, M.D.    JSM/MEDQ  D:  06/04/2016  T:  06/04/2016  Job:  [id number]

## 2016-06-16 DIAGNOSIS — R7982 Elevated C-reactive protein (CRP): Secondary | ICD-10-CM | POA: Insufficient documentation

## 2016-06-24 ENCOUNTER — Encounter (HOSPITAL_COMMUNITY)
Admission: RE | Admit: 2016-06-24 | Discharge: 2016-06-24 | Disposition: A | Discharge status: Home / Self Care | Payer: 59 | Source: Ambulatory Visit | Attending: Obstetrics and Gynecology | Admitting: Obstetrics and Gynecology

## 2016-06-24 ENCOUNTER — Encounter (HOSPITAL_COMMUNITY): Payer: Self-pay

## 2016-06-24 DIAGNOSIS — N8311 Corpus luteum cyst of right ovary: Secondary | ICD-10-CM | POA: Diagnosis not present

## 2016-06-24 DIAGNOSIS — Z6841 Body Mass Index (BMI) 40.0 and over, adult: Secondary | ICD-10-CM | POA: Diagnosis not present

## 2016-06-24 DIAGNOSIS — G8929 Other chronic pain: Secondary | ICD-10-CM | POA: Diagnosis not present

## 2016-06-24 DIAGNOSIS — N736 Female pelvic peritoneal adhesions (postinfective): Secondary | ICD-10-CM | POA: Diagnosis not present

## 2016-06-24 DIAGNOSIS — R102 Pelvic and perineal pain: Secondary | ICD-10-CM | POA: Diagnosis not present

## 2016-06-24 HISTORY — DX: Other specified postprocedural states: Z98.890

## 2016-06-24 HISTORY — DX: Nausea with vomiting, unspecified: R11.2

## 2016-06-24 HISTORY — DX: Hypothyroidism, unspecified: E03.9

## 2016-06-24 LAB — ABO/RH: ABO/RH(D): B NEG

## 2016-06-24 LAB — TYPE AND SCREEN
ABO/RH(D): B NEG
Antibody Screen: NEGATIVE

## 2016-06-24 LAB — CBC
HEMATOCRIT: 38.8 % (ref 36.0–46.0)
Hemoglobin: 12.9 g/dL (ref 12.0–15.0)
MCH: 28.5 pg (ref 26.0–34.0)
MCHC: 33.2 g/dL (ref 30.0–36.0)
MCV: 85.7 fL (ref 78.0–100.0)
Platelets: 404 10*3/uL — ABNORMAL HIGH (ref 150–400)
RBC: 4.53 MIL/uL (ref 3.87–5.11)
RDW: 14.2 % (ref 11.5–15.5)
WBC: 13.8 10*3/uL — AB (ref 4.0–10.5)

## 2016-06-24 NOTE — Patient Instructions (Addendum)
Your procedure is scheduled on:  Thursday, Dec. 14, 2017  Enter through the [employer] [facility] at:  6:00 AM  Pick up the phone at the desk and dial [phone].  Call this number if you have problems the morning of surgery: [phone].  Remember: Do NOT eat food or drink after:  Midnight Wednesday  Take these medicines the morning of surgery with a SIP OF WATER:  Wellbutrin, Levothyroxine, Clonazepam if needed  Stop ALL herbal medications at this time   Do NOT wear jewelry (body piercing), metal hair clips/bobby pins, make-up, or nail polish. Do NOT wear lotions, powders, or perfumes.  You may wear deodorant. Do NOT shave for 48 hours prior to surgery. Do NOT bring valuables to the hospital. Contacts, dentures, or bridgework may not be worn into surgery.  Have a responsible adult drive you home and stay with you for 24 hours after your procedure

## 2016-06-25 ENCOUNTER — Encounter (HOSPITAL_COMMUNITY): Payer: Self-pay | Admitting: Anesthesiology

## 2016-06-25 NOTE — Anesthesia Preprocedure Evaluation (Addendum)
Anesthesia Evaluation  Patient identified by MRN, date of birth, ID band Patient awake    Reviewed: Allergy & Precautions, NPO status , Patient's Chart, lab work & pertinent test results  Airway Mallampati: I       Dental no notable dental hx.    Pulmonary neg pulmonary ROS,    Pulmonary exam normal        Cardiovascular Normal cardiovascular exam     Neuro/Psych    GI/Hepatic Neg liver ROS,   Endo/Other  Morbid obesity  Renal/GU      Musculoskeletal negative musculoskeletal ROS (+)   Abdominal (+) + obese,   Peds  Hematology negative hematology ROS (+)   Anesthesia Other Findings   Reproductive/Obstetrics negative OB ROS                            Anesthesia Physical Anesthesia Plan  ASA: III  Anesthesia Plan: General   Post-op Pain Management:    Induction: Intravenous  Airway Management Planned: Oral ETT  Additional Equipment:   Intra-op Plan:   Post-operative Plan: Extubation in OR  Informed Consent: I have reviewed the patients History and Physical, chart, labs and discussed the procedure including the risks, benefits and alternatives for the proposed anesthesia with the patient or authorized representative who has indicated his/her understanding and acceptance.   Dental advisory given  Plan Discussed with:   Anesthesia Plan Comments:        Anesthesia Quick Evaluation

## 2016-06-26 ENCOUNTER — Ambulatory Visit (HOSPITAL_COMMUNITY)
Admission: RE | Admit: 2016-06-26 | Discharge: 2016-06-26 | Disposition: A | Discharge status: Home / Self Care | Payer: 59 | Source: Ambulatory Visit | Attending: Obstetrics and Gynecology | Admitting: Obstetrics and Gynecology

## 2016-06-26 ENCOUNTER — Encounter (HOSPITAL_COMMUNITY): Payer: Self-pay

## 2016-06-26 ENCOUNTER — Ambulatory Visit (HOSPITAL_COMMUNITY): Payer: 59 | Admitting: Anesthesiology

## 2016-06-26 ENCOUNTER — Encounter (HOSPITAL_COMMUNITY)
Admission: RE | Disposition: A | Discharge status: Home / Self Care | Payer: Self-pay | Source: Ambulatory Visit | Attending: Obstetrics and Gynecology

## 2016-06-26 DIAGNOSIS — R102 Pelvic and perineal pain: Secondary | ICD-10-CM | POA: Insufficient documentation

## 2016-06-26 DIAGNOSIS — Z6841 Body Mass Index (BMI) 40.0 and over, adult: Secondary | ICD-10-CM | POA: Insufficient documentation

## 2016-06-26 DIAGNOSIS — G8929 Other chronic pain: Secondary | ICD-10-CM | POA: Diagnosis not present

## 2016-06-26 DIAGNOSIS — N8311 Corpus luteum cyst of right ovary: Secondary | ICD-10-CM | POA: Insufficient documentation

## 2016-06-26 DIAGNOSIS — N736 Female pelvic peritoneal adhesions (postinfective): Secondary | ICD-10-CM | POA: Insufficient documentation

## 2016-06-26 HISTORY — PX: LAPAROSCOPIC SALPINGO OOPHERECTOMY: SHX5927

## 2016-06-26 HISTORY — PX: CYSTOSCOPY: SHX5120

## 2016-06-26 SURGERY — SALPINGO-OOPHORECTOMY, LAPAROSCOPIC
Anesthesia: General | Site: Abdomen

## 2016-06-26 MED ORDER — ONDANSETRON HCL 4 MG/2ML IJ SOLN
INTRAMUSCULAR | Status: AC
  Filled 2016-06-26: qty 2

## 2016-06-26 MED ORDER — SUGAMMADEX SODIUM 500 MG/5ML IV SOLN
INTRAVENOUS | Status: DC | PRN
  Administered 2016-06-26: 212 mg via INTRAVENOUS

## 2016-06-26 MED ORDER — GLYCOPYRROLATE 0.2 MG/ML IJ SOLN
INTRAMUSCULAR | Status: AC
  Filled 2016-06-26: qty 2

## 2016-06-26 MED ORDER — LACTATED RINGERS IV SOLN
INTRAVENOUS | Status: DC
  Administered 2016-06-26 (×2): via INTRAVENOUS
  Administered 2016-06-26: 125 mL/h via INTRAVENOUS

## 2016-06-26 MED ORDER — BUPIVACAINE HCL (PF) 0.25 % IJ SOLN
INTRAMUSCULAR | Status: DC | PRN
  Administered 2016-06-26: 11 mL

## 2016-06-26 MED ORDER — ROCURONIUM BROMIDE 100 MG/10ML IV SOLN
INTRAVENOUS | Status: AC
  Filled 2016-06-26: qty 1

## 2016-06-26 MED ORDER — SCOPOLAMINE 1 MG/3DAYS TD PT72
1.0000 | MEDICATED_PATCH | Freq: Once | TRANSDERMAL | Status: AC
  Administered 2016-06-26: 1.5 mg via TRANSDERMAL
  Administered 2016-06-26: 1 via TRANSDERMAL

## 2016-06-26 MED ORDER — OXYCODONE-ACETAMINOPHEN 5-325 MG PO TABS: 1.0000 | ORAL_TABLET | ORAL | Status: DC | PRN

## 2016-06-26 MED ORDER — FLUORESCEIN SODIUM 10 % IV SOLN
INTRAVENOUS | Status: AC
  Filled 2016-06-26: qty 5

## 2016-06-26 MED ORDER — DEXAMETHASONE SODIUM PHOSPHATE 10 MG/ML IJ SOLN
INTRAMUSCULAR | Status: AC
  Filled 2016-06-26: qty 1

## 2016-06-26 MED ORDER — FENTANYL CITRATE (PF) 100 MCG/2ML IJ SOLN
INTRAMUSCULAR | Status: DC | PRN
Start: 2016-06-26 — End: 2016-06-26
  Administered 2016-06-26 (×5): 50 ug via INTRAVENOUS

## 2016-06-26 MED ORDER — MIDAZOLAM HCL 5 MG/5ML IJ SOLN
INTRAMUSCULAR | Status: DC | PRN
  Administered 2016-06-26 (×2): 1 mg via INTRAVENOUS

## 2016-06-26 MED ORDER — DEXAMETHASONE SODIUM PHOSPHATE 4 MG/ML IJ SOLN
INTRAMUSCULAR | Status: DC | PRN
  Administered 2016-06-26: 10 mg via INTRAVENOUS

## 2016-06-26 MED ORDER — PROPOFOL 10 MG/ML IV BOLUS
INTRAVENOUS | Status: AC
  Filled 2016-06-26: qty 20

## 2016-06-26 MED ORDER — ONDANSETRON HCL 4 MG/2ML IJ SOLN: 4.0000 mg | Freq: Once | INTRAMUSCULAR | Status: DC | PRN

## 2016-06-26 MED ORDER — ROCURONIUM BROMIDE 100 MG/10ML IV SOLN
INTRAVENOUS | Status: DC | PRN
  Administered 2016-06-26: 40 mg via INTRAVENOUS
  Administered 2016-06-26: 10 mg via INTRAVENOUS

## 2016-06-26 MED ORDER — FENTANYL CITRATE (PF) 250 MCG/5ML IJ SOLN
INTRAMUSCULAR | Status: AC
  Filled 2016-06-26: qty 5

## 2016-06-26 MED ORDER — KETOROLAC TROMETHAMINE 30 MG/ML IJ SOLN
INTRAMUSCULAR | Status: DC | PRN
  Administered 2016-06-26: 30 mg via INTRAVENOUS

## 2016-06-26 MED ORDER — KETOROLAC TROMETHAMINE 30 MG/ML IJ SOLN
INTRAMUSCULAR | Status: AC
  Filled 2016-06-26: qty 1

## 2016-06-26 MED ORDER — OXYCODONE-ACETAMINOPHEN 7.5-325 MG PO TABS: 1.0000 | ORAL_TABLET | ORAL | 0 refills | Status: DC | PRN

## 2016-06-26 MED ORDER — LIDOCAINE HCL (CARDIAC) 20 MG/ML IV SOLN
INTRAVENOUS | Status: AC
  Filled 2016-06-26: qty 5

## 2016-06-26 MED ORDER — PROPOFOL 10 MG/ML IV BOLUS
INTRAVENOUS | Status: DC | PRN
  Administered 2016-06-26: 150 mg via INTRAVENOUS
  Administered 2016-06-26: 50 mg via INTRAVENOUS

## 2016-06-26 MED ORDER — LACTATED RINGERS IR SOLN
Status: DC | PRN
  Administered 2016-06-26: 3000 mL

## 2016-06-26 MED ORDER — KETOROLAC TROMETHAMINE 30 MG/ML IJ SOLN: 30.0000 mg | Freq: Once | INTRAMUSCULAR | Status: DC

## 2016-06-26 MED ORDER — OXYCODONE HCL 5 MG/5ML PO SOLN: 5.0000 mg | Freq: Once | ORAL | Status: AC | PRN

## 2016-06-26 MED ORDER — CEFAZOLIN SODIUM-DEXTROSE 2-4 GM/100ML-% IV SOLN
2.0000 g | INTRAVENOUS | Status: AC
  Administered 2016-06-26: 2 g via INTRAVENOUS

## 2016-06-26 MED ORDER — SCOPOLAMINE 1 MG/3DAYS TD PT72
MEDICATED_PATCH | TRANSDERMAL | Status: AC
  Administered 2016-06-26: 1.5 mg via TRANSDERMAL
  Filled 2016-06-26: qty 1

## 2016-06-26 MED ORDER — ACETAMINOPHEN 325 MG PO TABS: 325.0000 mg | ORAL_TABLET | ORAL | Status: DC | PRN

## 2016-06-26 MED ORDER — LIDOCAINE 2% (20 MG/ML) 5 ML SYRINGE
INTRAMUSCULAR | Status: DC | PRN
  Administered 2016-06-26: 60 mg via INTRAVENOUS

## 2016-06-26 MED ORDER — MIDAZOLAM HCL 2 MG/2ML IJ SOLN
INTRAMUSCULAR | Status: AC
  Filled 2016-06-26: qty 2

## 2016-06-26 MED ORDER — BUPIVACAINE HCL (PF) 0.25 % IJ SOLN
INTRAMUSCULAR | Status: AC
  Filled 2016-06-26: qty 30

## 2016-06-26 MED ORDER — OXYCODONE HCL 5 MG PO TABS
5.0000 mg | ORAL_TABLET | Freq: Once | ORAL | Status: AC | PRN
  Administered 2016-06-26: 5 mg via ORAL

## 2016-06-26 MED ORDER — OXYCODONE HCL 5 MG PO TABS
ORAL_TABLET | ORAL | Status: AC
  Filled 2016-06-26: qty 1

## 2016-06-26 MED ORDER — MEPERIDINE HCL 25 MG/ML IJ SOLN: 6.2500 mg | INTRAMUSCULAR | Status: DC | PRN

## 2016-06-26 MED ORDER — FENTANYL CITRATE (PF) 100 MCG/2ML IJ SOLN
INTRAMUSCULAR | Status: AC
  Administered 2016-06-26: 25 ug via INTRAVENOUS
  Filled 2016-06-26: qty 2

## 2016-06-26 MED ORDER — ACETAMINOPHEN 160 MG/5ML PO SOLN: 325.0000 mg | ORAL | Status: DC | PRN

## 2016-06-26 MED ORDER — FENTANYL CITRATE (PF) 100 MCG/2ML IJ SOLN
25.0000 ug | INTRAMUSCULAR | Status: DC | PRN
  Administered 2016-06-26 (×2): 25 ug via INTRAVENOUS

## 2016-06-26 SURGICAL SUPPLY — 68 items
ADH SKN CLS APL DERMABOND .7 (GAUZE/BANDAGES/DRESSINGS) ×2
APL SRG 38 LTWT LNG FL B (MISCELLANEOUS) ×2
APPLICATOR ARISTA FLEXITIP XL (MISCELLANEOUS) ×1 IMPLANT
BAG SPEC RTRVL LRG 6X4 10 (ENDOMECHANICALS) ×4
BLADE SURG 10 STRL SS (BLADE) ×6 IMPLANT
CABLE HIGH FREQUENCY MONO STRZ (ELECTRODE) IMPLANT
CANISTER SUCT 3000ML (MISCELLANEOUS) ×3 IMPLANT
CATH ROBINSON RED A/P 16FR (CATHETERS) IMPLANT
CLOTH BEACON ORANGE TIMEOUT ST (SAFETY) ×3 IMPLANT
DECANTER SPIKE VIAL GLASS SM (MISCELLANEOUS) ×1 IMPLANT
DERMABOND ADVANCED (GAUZE/BANDAGES/DRESSINGS) ×1
DERMABOND ADVANCED .7 DNX12 (GAUZE/BANDAGES/DRESSINGS) ×2 IMPLANT
DRAPE WARM FLUID 44X44 (DRAPE) IMPLANT
DURAPREP 26ML APPLICATOR (WOUND CARE) ×3 IMPLANT
ELECT BLADE 6.5 EXT (BLADE) IMPLANT
ELECT REM PT RETURN 9FT ADLT (ELECTROSURGICAL) ×3
ELECTRODE REM PT RTRN 9FT ADLT (ELECTROSURGICAL) ×2 IMPLANT
GAUZE SPONGE 4X4 16PLY XRAY LF (GAUZE/BANDAGES/DRESSINGS) IMPLANT
GLOVE BIO SURGEON STRL SZ7 (GLOVE) ×3 IMPLANT
GLOVE BIOGEL PI IND STRL 7.0 (GLOVE) ×2 IMPLANT
GLOVE BIOGEL PI INDICATOR 7.0 (GLOVE) ×1
GOWN STRL REUS W/TWL LRG LVL3 (GOWN DISPOSABLE) ×6 IMPLANT
HEMOSTAT ARISTA ABSORB 3G PWDR (MISCELLANEOUS) ×1 IMPLANT
IV STOPCOCK 4 WAY 40  W/Y SET (IV SOLUTION)
IV STOPCOCK 4 WAY 40 W/Y SET (IV SOLUTION) IMPLANT
NEEDLE HYPO 22GX1.5 SAFETY (NEEDLE) IMPLANT
NEEDLE INSUFFLATION 120MM (ENDOMECHANICALS) IMPLANT
NS IRRIG 1000ML POUR BTL (IV SOLUTION) ×3 IMPLANT
PACK ABDOMINAL GYN (CUSTOM PROCEDURE TRAY) ×2 IMPLANT
PACK LAPAROSCOPY BASIN (CUSTOM PROCEDURE TRAY) ×3 IMPLANT
PACK TRENDGUARD 450 HYBRID PRO (MISCELLANEOUS) IMPLANT
PACK TRENDGUARD 600 HYBRD PROC (MISCELLANEOUS) IMPLANT
PAD OB MATERNITY 4.3X12.25 (PERSONAL CARE ITEMS) ×3 IMPLANT
PENCIL SMOKE EVAC W/HOLSTER (ELECTROSURGICAL) ×3 IMPLANT
POUCH SPECIMEN RETRIEVAL 10MM (ENDOMECHANICALS) ×2 IMPLANT
PROTECTOR NERVE ULNAR (MISCELLANEOUS) ×5 IMPLANT
SCISSORS LAP 5X35 DISP (ENDOMECHANICALS) IMPLANT
SCISSORS LAP 5X45 EPIX DISP (ENDOMECHANICALS) IMPLANT
SEALER TISSUE G2 CVD JAW 45CM (ENDOMECHANICALS) ×1 IMPLANT
SET IRRIG TUBING LAPAROSCOPIC (IRRIGATION / IRRIGATOR) IMPLANT
SLEEVE XCEL OPT CAN 5 100 (ENDOMECHANICALS) ×3 IMPLANT
SPONGE LAP 18X18 X RAY DECT (DISPOSABLE) ×4 IMPLANT
STAPLER VISISTAT 35W (STAPLE) ×2 IMPLANT
SUT CHROMIC 0 CT 1 (SUTURE) IMPLANT
SUT PDS AB 1 CT  36 (SUTURE)
SUT PDS AB 1 CT 36 (SUTURE) ×4 IMPLANT
SUT PDS AB 5-0 P3 18 (SUTURE) IMPLANT
SUT PLAIN 2 0 XLH (SUTURE) IMPLANT
SUT VIC AB 3-0 CT1 27 (SUTURE) ×3
SUT VIC AB 3-0 CT1 TAPERPNT 27 (SUTURE) ×2 IMPLANT
SUT VIC AB 3-0 PS1 18 (SUTURE)
SUT VIC AB 3-0 PS1 18X BRD (SUTURE) IMPLANT
SUT VIC AB 3-0 PS2 18 (SUTURE) ×3
SUT VIC AB 3-0 PS2 18XBRD (SUTURE) ×2 IMPLANT
SUT VICRYL 0 ENDOLOOP (SUTURE) IMPLANT
SUT VICRYL 0 TIES 12 18 (SUTURE) IMPLANT
SUT VICRYL 0 UR6 27IN ABS (SUTURE) ×2 IMPLANT
SYR 50ML LL SCALE MARK (SYRINGE) IMPLANT
SYR CONTROL 10ML LL (SYRINGE) IMPLANT
TOWEL OR 17X24 6PK STRL BLUE (TOWEL DISPOSABLE) ×6 IMPLANT
TRAY FOLEY CATH SILVER 14FR (SET/KITS/TRAYS/PACK) ×3 IMPLANT
TRENDGUARD 450 HYBRID PRO PACK (MISCELLANEOUS)
TRENDGUARD 600 HYBRID PROC PK (MISCELLANEOUS) ×3
TROCAR BALLN 12MMX100 BLUNT (TROCAR) ×1 IMPLANT
TROCAR OPTI TIP 5M 100M (ENDOMECHANICALS) ×1 IMPLANT
TROCAR XCEL DIL TIP R 11M (ENDOMECHANICALS) ×1 IMPLANT
WARMER LAPAROSCOPE (MISCELLANEOUS) ×3 IMPLANT
WATER STERILE IRR 1000ML POUR (IV SOLUTION) ×3 IMPLANT

## 2016-06-26 NOTE — Discharge Instructions (Signed)
DISCHARGE INSTRUCTIONS: Laparoscopy  The following instructions have been prepared to help you care for yourself upon your return home today.  Wound care:  Do not get the incision wet for the first 24 hours. The incision should be kept clean and dry.  The Band-Aids or dressings may be removed the day after surgery.  Should the incision become sore, red, and swollen after the first week, check with your doctor.  Personal hygiene:  Shower the day after your procedure.  Activity and limitations:  Do NOT drive or operate any equipment today.  Do NOT lift anything more than 15 pounds for 2-3 weeks after surgery.  Do NOT rest in bed all day.  Walking is encouraged. Walk each day, starting slowly with 5-minute walks 3 or 4 times a day. Slowly increase the length of your walks.  Walk up and down stairs slowly.  Do NOT do strenuous activities, such as golfing, playing tennis, bowling, running, biking, weight lifting, gardening, mowing, or vacuuming for 2-4 weeks. Ask your doctor when it is okay to start.  Diet: Eat a light meal as desired this evening. You may resume your usual diet tomorrow.  Return to work: This is dependent on the type of work you do. For the most part you can return to a desk job within a week of surgery. If you are more active at work, please discuss this with your doctor.  What to expect after your surgery: You may have a slight burning sensation when you urinate on the first day. You may have a very small amount of blood in the urine. Expect to have a small amount of vaginal discharge/light bleeding for 1-2 weeks. It is not unusual to have abdominal soreness and bruising for up to 2 weeks. You may be tired and need more rest for about 1 week. You may experience shoulder pain for 24-72 hours. Lying flat in bed may relieve it.  Call your doctor for any of the following:  Develop a fever of 100.4 or greater  Inability to urinate 6 hours after discharge from  hospital  Severe pain not relieved by pain medications  Persistent of heavy bleeding at incision site  Redness or swelling around incision site after a week  Increasing nausea or vomiting  Patient Signature________________________________________ Nurse Signature_________________________________________ Post Anesthesia Home Care Instructions  Activity: Get plenty of rest for the remainder of the day. A responsible adult should stay with you for 24 hours following the procedure.  For the next 24 hours, DO NOT: -Drive a car -Paediatric nurse -Drink alcoholic beverages -Take any medication unless instructed by your physician -Make any legal decisions or sign important papers.  Meals: Start with liquid foods such as gelatin or soup. Progress to regular foods as tolerated. Avoid greasy, spicy, heavy foods. If nausea and/or vomiting occur, drink only clear liquids until the nausea and/or vomiting subsides. Call your physician if vomiting continues.  Special Instructions/Symptoms: Your throat may feel dry or sore from the anesthesia or the breathing tube placed in your throat during surgery. If this causes discomfort, gargle with warm salt water. The discomfort should disappear within 24 hours.  If you had a scopolamine patch placed behind your ear for the management of post- operative nausea and/or vomiting:  1. The medication in the patch is effective for 72 hours, after which it should be removed.  Wrap patch in a tissue and discard in the trash. Wash hands thoroughly with soap and water. 2. You may remove the patch  earlier than 72 hours if you experience unpleasant side effects which may include dry mouth, dizziness or visual disturbances. 3. Avoid touching the patch. Wash your hands with soap and water after contact with the patch.   

## 2016-06-26 NOTE — Anesthesia Postprocedure Evaluation (Signed)
Anesthesia Post Note  Patient: Dominique Nunez  Procedure(s) Performed: Procedure(s) (LRB): LAPAROSCOPIC SALPINGO OOPHORECTOMY,BILATERAL (Bilateral) CYSTOSCOPY (N/A)  Patient location during evaluation: PACU Anesthesia Type: General Level of consciousness: awake and sedated Pain management: pain level controlled Vital Signs Assessment: post-procedure vital signs reviewed and stable Respiratory status: spontaneous breathing Cardiovascular status: stable Postop Assessment: no signs of nausea or vomiting Anesthetic complications: no     Last Vitals:  Vitals:   06/26/16 1041 06/26/16 1045  BP:  117/67  Pulse: 87 82  Resp: 12 14  Temp:  36.7 C    Last Pain:  Vitals:   06/26/16 1045  TempSrc:   PainSc: 0-No pain   Pain Goal: Patients Stated Pain Goal: 4 (06/26/16 1041)               Saxon

## 2016-06-26 NOTE — Transfer of Care (Signed)
Immediate Anesthesia Transfer of Care Note  Patient: Dominique Nunez  Procedure(s) Performed: Procedure(s): LAPAROSCOPIC SALPINGO OOPHORECTOMY,BILATERAL (Bilateral) CYSTOSCOPY (N/A)  Patient Location: PACU  Anesthesia Type:General  Level of Consciousness: awake and oriented  Airway & Oxygen Therapy: Patient Spontanous Breathing and Patient connected to nasal cannula oxygen  Post-op Assessment: Report given to RN and Post -op Vital signs reviewed and stable  Post vital signs: Reviewed and stable  Last Vitals:  Vitals:   06/26/16 0619  BP: 106/81  Pulse: 71  Resp: 20    Last Pain:  Vitals:   06/26/16 0619  TempSrc: Oral      Patients Stated Pain Goal: 4 ([id number])  Complications: No apparent anesthesia complications

## 2016-06-26 NOTE — H&P (Signed)
  History and physical exam unchanged 

## 2016-06-26 NOTE — Brief Op Note (Signed)
06/26/2016  8:44 AM  PATIENT:  Dominique Nunez  50 y.o. female  PRE-OPERATIVE DIAGNOSIS:  PELVIC PAIN  POST-OPERATIVE DIAGNOSIS:  PELVIC PAIN  PROCEDURE:  Procedure(s): LAPAROSCOPIC SALPINGO OOPHORECTOMY,BILATERAL (Bilateral) CYSTOSCOPY (N/A)  SURGEON:  Surgeon(s) and Role:    * Arvella Nigh, MD - Primary  PHYSICIAN ASSISTANT:   ASSISTANTS: none   ANESTHESIA:   general  EBL:  Total I/O In: 1000 [I.V.:1000] Out: 70 [Urine:50; Blood:20]  BLOOD ADMINISTERED:none  DRAINS: none   LOCAL MEDICATIONS USED:  MARCAINE     SPECIMEN:  Source of Specimen:  both tubes and ovaries  DISPOSITION OF SPECIMEN:  PATHOLOGY  COUNTS:  YES  TOURNIQUET:  * No tourniquets in log *  DICTATION: .Other Dictation: Dictation Number [id number]  PLAN OF CARE: Discharge to home after PACU  PATIENT DISPOSITION:  PACU - hemodynamically stable.   Delay start of Pharmacological VTE agent (>24hrs) due to surgical blood loss or risk of bleeding: not applicable

## 2016-06-26 NOTE — Anesthesia Procedure Notes (Signed)
Procedure Name: Intubation Date/Time: 06/26/2016 7:31 AM Performed by: Vernice Jefferson Pre-anesthesia Checklist: Patient identified, Emergency Drugs available, Suction available, Patient being monitored and Timeout performed Patient Re-evaluated:Patient Re-evaluated prior to inductionOxygen Delivery Method: Circle system utilized Preoxygenation: Pre-oxygenation with 100% oxygen Intubation Type: IV induction Ventilation: Mask ventilation without difficulty Laryngoscope Size: Mac and 3 Grade View: Grade I Tube type: Oral Tube size: 7.0 mm Number of attempts: 1 Airway Equipment and Method: Stylet Placement Confirmation: ETT inserted through vocal cords under direct vision,  positive ETCO2 and breath sounds checked- equal and bilateral Secured at: 20 cm Tube secured with: Tape Dental Injury: Teeth and Oropharynx as per pre-operative assessment

## 2016-06-27 ENCOUNTER — Encounter (HOSPITAL_COMMUNITY): Payer: Self-pay | Admitting: Obstetrics and Gynecology

## 2016-06-27 NOTE — Op Note (Signed)
NAME:  Dominique Nunez, Dominique Nunez NO.:  [id number]  MEDICAL RECORD NO.:  [id number]  LOCATION:                                 FACILITY:  PHYSICIAN:  Darlyn Chamber, M.D.        DATE OF BIRTH:  DATE OF PROCEDURE:  06/26/2016 DATE OF DISCHARGE:                              OPERATIVE REPORT   PREOPERATIVE DIAGNOSIS:  Chronic pelvic pain, history of known pelvic adhesions.  PROCEDURE:  Open laparoscopy with laparoscopic bilateral salpingo- oophorectomy, cystoscopy.  SURGEON:  Darlyn Chamber, M.D.  ANESTHESIA:  General.  ESTIMATED BLOOD LOSS:  Minimal.  PACKS:  None.  DRAINS:  None.  INTRAOPERATIVE BLOOD PLACED:  None.  COMPLICATIONS:  None.  INDICATION:  Dictated in history and physical.  PROCEDURE IN DETAIL:  The patient was taken to the OR, placed in supine position.  After satisfactory level of general endotracheal anesthesia obtained, the patient was placed in dorsal lithotomy position using the Allen stirrups.  The peritoneum and vagina were prepped out with Betadine.  A Foley was placed to straight drain.  A sponge on a sponge stick was placed in a vaginal vault.  The abdomen was prepped with DuraPrep.  After it had dried to the point, the patient was then draped in sterile field.  The subumbilical incision was then made with a knife and extended through the subcutaneous tissue.  Fascia was identified, elevated with Kochers and entered sharply, incision to fascia laterally. Peritoneum was entered with blunt finger pressure.  Open laparoscopic trocar was inserted and secured.  Abdomen was insufflated with carbon dioxide.  Laparoscope was introduced.  There was no evidence of injury to adjacent organs.  A 5-mm trocar was put in place in suprapubic area under direct visualization.  The right ovary had a hemorrhagic cyst, left ovary, as well as the pelvis.  There were really no adhesions to the vaginal cuff.  The upper abdomen including the liver, tip of  the gallbladder, and both lateral gutters were clear.  Appendix was visualized and noted to be normal.  Decision was to proceed with laparoscopic removal of both tubes and ovaries.  We removed the 5 mm trocar to extend the suprapubic incision and inserted a 10/11 trocar.  A 5 mm trocar was put in place to the left lower quadrant after visualization of the epigastric vessels.  We first elevated the right tube and ovary.  We could identify the ureter along the pelvic sidewall very well from the ovarian vasculature.  Using the EnSeal, the ovarian vasculature was cauterized and incised.  The mesenteric attachment to the ovary was cauterized and incised also.  Distal portion of the tube was still adherent to the peritoneum.  This was cauterized and incised and removed the distal part of the tube through the umbilicus.  Next, an endobag was put in place.  The remaining part of the tube and the right ovary was put in the endobag and removed through the suprapubic incision.  We then went back and looked to the area.  We used the EnSeal for further cautery, had fairly good hemostasis.  Attention was now turned to the left ovary.  It was well up and along the pelvic sidewall. We could not really identify the ureter, but felt it was way out of way. We elevated the tube and ovary using the EnSeal.  The ovarian vasculature was cauterized and incised.  Mesenteric attachments of the tube and ovary were cauterized and incised.  An endobag was inserted through the suprapubic port.  The tube and ovary were placed in a bag and removed through the suprapubic area.  Both tubes and ovaries were sent for pathological review.  We replaced the trocar in the suprapubic area.  We then visualized where the areas of the ovary removed.  We brought in the bipolar.  These areas were re-cauterized.  We de-inflated the abdomen and had fairly good hemostasis.  We then used Arista over the areas.  We had great hemostasis.   The abdomen was deflated with carbon dioxide.  All trocars were removed.  Subumbilical fascia was closed with 2 figure-of-eight of 0 Vicryl.  Skin was closed with interrupted subcuticulars of 4-0 Vicryl.  Suprapubic incision was closed with interrupted sutures of 3-0 Vicryl and Dermabond.  The left lower quadrant incision was closed with Dermabond.  Foley was then removed.  Urine output remained clear.  Cystoscopy was performed.  No evidence of injury to the bladder.  She had jets of urine coming from both ureters.  We did not use any type of dye, the jets were quite clear and abundant.  Next, the cystoscope was removed.  The patient was taken out of the dorsal lithotomy position.  Once alert and extubated, transferred to recovery room in good condition.  Sponge, instrument, and needle counts were correct by circulating nurse x2.     Darlyn Chamber, M.D.     JSM/MEDQ  D:  06/26/2016  T:  06/27/2016  Job:  [id number]

## 2016-08-09 ENCOUNTER — Other Ambulatory Visit: Payer: Self-pay | Admitting: Family Medicine

## 2016-08-12 ENCOUNTER — Encounter: Payer: Self-pay | Admitting: Family Medicine

## 2016-08-13 ENCOUNTER — Encounter: Payer: Self-pay | Admitting: Family Medicine

## 2016-09-17 ENCOUNTER — Other Ambulatory Visit: Payer: Self-pay | Admitting: Family Medicine

## 2016-09-18 ENCOUNTER — Encounter: Payer: Self-pay | Admitting: Family Medicine

## 2016-09-18 ENCOUNTER — Ambulatory Visit (INDEPENDENT_AMBULATORY_CARE_PROVIDER_SITE_OTHER): Payer: 59 | Admitting: Family Medicine

## 2016-09-18 VITALS — BP 110/70 | HR 66 | Temp 98.1°F | Ht 64.0 in | Wt 239.0 lb

## 2016-09-18 DIAGNOSIS — Z23 Encounter for immunization: Secondary | ICD-10-CM | POA: Diagnosis not present

## 2016-09-18 DIAGNOSIS — E039 Hypothyroidism, unspecified: Secondary | ICD-10-CM

## 2016-09-18 DIAGNOSIS — Z01419 Encounter for gynecological examination (general) (routine) without abnormal findings: Secondary | ICD-10-CM

## 2016-09-18 DIAGNOSIS — Z0001 Encounter for general adult medical examination with abnormal findings: Secondary | ICD-10-CM | POA: Diagnosis not present

## 2016-09-18 DIAGNOSIS — F329 Major depressive disorder, single episode, unspecified: Secondary | ICD-10-CM

## 2016-09-18 DIAGNOSIS — F32A Depression, unspecified: Secondary | ICD-10-CM

## 2016-09-18 NOTE — Progress Notes (Signed)
Subjective:   Patient ID: Dominique Nunez, female    DOB: Oct 03, 1965, 51 y.o.   MRN: [id number]  Dominique Nunez is a pleasant 51 y.o. year old female who presents to clinic today with Annual Exam (Discuss medication options.)  on 09/18/2016  HPI:  S/p laparoscopic salipingo ooprhectomy by Dr. Radene Knee in 06/2016 for pelvic pain. Remote h/o hysterectomy Mammogram was within the past month at Dr. Sherran Needs office per pt.  Hypothyroidism- has been clinically euthyroid on synthroid 88 mcg daily.  Due for labs. Lab Results  Component Value Date   TSH 5.080 (H) 03/04/2016   Depression does feel that zoloft and Wellbutrin are not controlling her depression well.  Still has tearfulness and anehedonia Has still been seeing Product/process development scientist.   In December, I did refer her to see a rheumatologist for poly arthralgias and elevated crp. Saw Dr. Lezlie LyeMeda Coffee on 06/16/16- note reviewed.  Lab Results  Component Value Date   WBC 13.8 (H) 06/24/2016   HGB 12.9 06/24/2016   HCT 38.8 06/24/2016   MCV 85.7 06/24/2016   PLT 404 (H) 06/24/2016   Lab Results  Component Value Date   NA 138 05/22/2016   K 5.0 05/22/2016   CL 99 05/22/2016   CO2 22 05/22/2016   No results found for: CHOL, HDL, LDLCALC, LDLDIRECT, TRIG, CHOLHDL Current Outpatient Prescriptions on File Prior to Visit  Medication Sig Dispense Refill  . acetaminophen (TYLENOL) 650 MG CR tablet Take 1,300 mg by mouth 2 (two) times daily as needed for pain.    Marland Kitchen buPROPion (WELLBUTRIN XL) 300 MG 24 hr tablet Take 1 tablet (300 mg total) by mouth daily. 90 tablet 3  . cetirizine (ZYRTEC) 10 MG tablet Take 10 mg by mouth daily.    . clonazePAM (KLONOPIN) 0.5 MG tablet TAKE 1 TABLET BY MOUTH DAILY AS NEEDED FOR ANXIETY 30 tablet 0  . ibuprofen (ADVIL,MOTRIN) 200 MG tablet Take 800 mg by mouth daily as needed for moderate pain.    Marland Kitchen levothyroxine (SYNTHROID, LEVOTHROID) 88 MCG tablet Take 1 tablet (88 mcg total) by mouth daily. 90 tablet 3  .  rizatriptan (MAXALT) 10 MG tablet Take 1 tablet (10 mg total) by mouth as needed for migraine. May repeat in 2 hours if needed 10 tablet 0  . sertraline (ZOLOFT) 100 MG tablet TAKE 1 TABLET BY MOUTH  DAILY 90 tablet 0  . traMADol (ULTRAM) 50 MG tablet Take 1 tablet by mouth daily as needed for moderate pain.     Marland Kitchen zolpidem (AMBIEN) 10 MG tablet Take 1 tablet (10 mg total) by mouth at bedtime as needed for sleep. 30 tablet 0   No current facility-administered medications on file prior to visit.     No Known Allergies  Past Medical History:  Diagnosis Date  . Depression   . GERD (gastroesophageal reflux disease)   . Headache   . Heart murmur   . History of fainting spells of unknown cause   . Hypothyroidism   . Kidney stones   . Migraine   . PONV (postoperative nausea and vomiting)   . Thyroid disease     Past Surgical History:  Procedure Laterality Date  . ABDOMINAL HYSTERECTOMY  2000  . BREAST BIOPSY  1984  . CHOLECYSTECTOMY  2000  . CYSTOSCOPY N/A 06/26/2016   Procedure: CYSTOSCOPY;  Surgeon: Arvella Nigh, MD;  Location: [facility];  Service: Gynecology;  Laterality: N/A;  . INNER EAR SURGERY    . LAPAROSCOPIC SALPINGO OOPHERECTOMY  Bilateral 06/26/2016   Procedure: LAPAROSCOPIC SALPINGO OOPHORECTOMY,BILATERAL;  Surgeon: Arvella Nigh, MD;  Location: [facility];  Service: Gynecology;  Laterality: Bilateral;  . TONSILLECTOMY AND ADENOIDECTOMY  1976  . WISDOM TOOTH EXTRACTION      Family History  Problem Relation Age of Onset  . Cancer Mother   . Ovarian cancer Mother   . Asthma Mother   . COPD Mother   . Alcohol abuse Father   . Kidney disease Father   . Diabetes Father   . Kidney disease Brother   . Hepatitis C Brother   . Stroke Paternal Aunt   . Cancer Paternal Aunt   . Alcohol abuse Maternal Grandfather   . Alcohol abuse Paternal Grandfather   . Arthritis Paternal Grandfather   . Cancer Paternal Grandfather   . Heart disease Paternal Grandfather   . Stroke Paternal  Grandfather     Social History   Social History  . Marital status: Married    Spouse name: N/A  . Number of children: N/A  . Years of education: N/A   Occupational History  . Not on file.   Social History Main Topics  . Smoking status: Never Smoker  . Smokeless tobacco: Never Used  . Alcohol use No  . Drug use: No  . Sexual activity: Yes    Birth control/ protection: Surgical   Other Topics Concern  . Not on file   Social History Narrative  . No narrative on file   The PMH, PSH, Social History, Family History, Medications, and allergies have been reviewed in [facility], and have been updated if relevant.    Review of Systems  Constitutional: Positive for fatigue.  HENT: Negative.   Respiratory: Negative.   Cardiovascular: Negative.   Gastrointestinal: Negative.   Endocrine: Negative.   Genitourinary: Negative.   Musculoskeletal: Positive for back pain.  Allergic/Immunologic: Negative.   Neurological: Negative.   Hematological: Negative.   Psychiatric/Behavioral: Positive for decreased concentration and sleep disturbance. Negative for self-injury and suicidal ideas.  All other systems reviewed and are negative.      Objective:    BP 110/70 (BP Location: Left Arm, Patient Position: Sitting, Cuff Size: Large)   Pulse 66   Temp 98.1 F (36.7 C) (Oral)   Ht 5\' 4"  (1.626 m)   Wt 239 lb (108.4 kg)   SpO2 98%   BMI 41.02 kg/m    Physical Exam    General:  Well-developed,well-nourished,in no acute distress; alert,appropriate and cooperative throughout examination Head:  normocephalic and atraumatic.   Eyes:  vision grossly intact, PERRL Ears:  R ear normal and L ear normal externally, TMs clear bilaterally Nose:  no external deformity.   Mouth:  good dentition.   Neck:  No deformities, masses, or tenderness noted. Lungs:  Normal respiratory effort, chest expands symmetrically. Lungs are clear to auscultation, no crackles or wheezes. Heart:  Normal rate and  regular rhythm. S1 and S2 normal without gallop, murmur, click, rub or other extra sounds. Abdomen:  Bowel sounds positive,abdomen soft and non-tender without masses, organomegaly or hernias noted. Msk:  No deformity or scoliosis noted of thoracic or lumbar spine.   Extremities:  No clubbing, cyanosis, edema, or deformity noted with normal full range of motion of all joints.   Neurologic:  alert & oriented X3 and gait normal.   Skin:  Intact without suspicious lesions or rashes Cervical Nodes:  No lymphadenopathy noted Axillary Nodes:  No palpable lymphadenopathy Psych:  Cognition and judgment appear intact. Alert and cooperative  with normal attention span and concentration. No apparent delusions, illusions, hallucinations      Assessment & Plan:   Hypothyroidism, unspecified type - Plan: TSH, T4, Free  Well woman exam - Plan: CBC with Differential/Platelet, Comprehensive metabolic panel, Lipid panel, Vitamin D, 25-hydroxy  Depression, unspecified depression type No Follow-up on file.

## 2016-09-18 NOTE — Progress Notes (Signed)
Pre visit review using our clinic review tool, if applicable. No additional management support is needed unless otherwise documented below in the visit note. 

## 2016-09-18 NOTE — Assessment & Plan Note (Signed)
Continue current dose of synthroid.  Check labs today. 

## 2016-09-18 NOTE — Assessment & Plan Note (Signed)
Deteriorated. Will refer to psychiatry for assessment and medication management. She is contracted for safety. The patient indicates understanding of these issues and agrees with the plan.

## 2016-09-18 NOTE — Patient Instructions (Signed)
Great to see you. We are referring to a psychiatrist. We will call you to help set up this appointment.   I will call you with your lab results.

## 2016-09-18 NOTE — Assessment & Plan Note (Signed)
Reviewed preventive care protocols, scheduled due services, and updated immunizations Discussed nutrition, exercise, diet, and healthy lifestyle.  Orders Placed This Encounter  Procedures  . Tdap vaccine greater than or equal to 49yo IM  . CBC with Differential/Platelet  . Comprehensive metabolic panel  . Lipid panel  . TSH  . T4, Free  . Vitamin D, 25-hydroxy  . Ambulatory referral to Psychiatry

## 2016-09-19 LAB — LIPID PANEL
CHOL/HDL RATIO: 3.4 ratio (ref 0.0–4.4)
Cholesterol, Total: 208 mg/dL — ABNORMAL HIGH (ref 100–199)
HDL: 61 mg/dL (ref >39)
LDL Calculated: 114 mg/dL — ABNORMAL HIGH (ref 0–99)
TRIGLYCERIDES: 163 mg/dL — AB (ref 0–149)
VLDL CHOLESTEROL CAL: 33 mg/dL (ref 5–40)

## 2016-09-19 LAB — CBC WITH DIFFERENTIAL/PLATELET
BASOS ABS: 0 10*3/uL (ref 0.0–0.2)
Basos: 0 %
EOS (ABSOLUTE): 0.3 10*3/uL (ref 0.0–0.4)
Eos: 3 %
Hematocrit: 38 % (ref 34.0–46.6)
Hemoglobin: 12.5 g/dL (ref 11.1–15.9)
IMMATURE GRANS (ABS): 0 10*3/uL (ref 0.0–0.1)
IMMATURE GRANULOCYTES: 0 %
LYMPHS: 30 %
Lymphocytes Absolute: 3 10*3/uL (ref 0.7–3.1)
MCH: 28.9 pg (ref 26.6–33.0)
MCHC: 32.9 g/dL (ref 31.5–35.7)
MCV: 88 fL (ref 79–97)
MONOS ABS: 0.5 10*3/uL (ref 0.1–0.9)
Monocytes: 5 %
NEUTROS PCT: 62 %
Neutrophils Absolute: 6.1 10*3/uL (ref 1.4–7.0)
PLATELETS: 358 10*3/uL (ref 150–379)
RBC: 4.33 x10E6/uL (ref 3.77–5.28)
RDW: 14.3 % (ref 12.3–15.4)
WBC: 10 10*3/uL (ref 3.4–10.8)

## 2016-09-19 LAB — COMPREHENSIVE METABOLIC PANEL
A/G RATIO: 1.7 (ref 1.2–2.2)
ALT: 18 IU/L (ref 0–32)
AST: 14 IU/L (ref 0–40)
Albumin: 3.8 g/dL (ref 3.5–5.5)
Alkaline Phosphatase: 86 IU/L (ref 39–117)
BUN/Creatinine Ratio: 21 (ref 9–23)
BUN: 15 mg/dL (ref 6–24)
Bilirubin Total: 0.4 mg/dL (ref 0.0–1.2)
CALCIUM: 9.6 mg/dL (ref 8.7–10.2)
CHLORIDE: 102 mmol/L (ref 96–106)
CO2: 24 mmol/L (ref 18–29)
Creatinine, Ser: 0.73 mg/dL (ref 0.57–1.00)
GFR, EST AFRICAN AMERICAN: 112 mL/min/{1.73_m2} (ref >59)
GFR, EST NON AFRICAN AMERICAN: 97 mL/min/{1.73_m2} (ref >59)
GLUCOSE: 108 mg/dL — AB (ref 65–99)
Globulin, Total: 2.3 g/dL (ref 1.5–4.5)
POTASSIUM: 5 mmol/L (ref 3.5–5.2)
Sodium: 140 mmol/L (ref 134–144)
TOTAL PROTEIN: 6.1 g/dL (ref 6.0–8.5)

## 2016-09-19 LAB — TSH: TSH: 3.43 u[IU]/mL (ref 0.450–4.500)

## 2016-09-19 LAB — VITAMIN D 25 HYDROXY (VIT D DEFICIENCY, FRACTURES): Vit D, 25-Hydroxy: 29 ng/mL — ABNORMAL LOW (ref 30.0–100.0)

## 2016-09-19 LAB — T4, FREE: Free T4: 1.27 ng/dL (ref 0.82–1.77)

## 2016-09-20 ENCOUNTER — Other Ambulatory Visit: Payer: Self-pay | Admitting: Family Medicine

## 2016-10-07 ENCOUNTER — Ambulatory Visit (INDEPENDENT_AMBULATORY_CARE_PROVIDER_SITE_OTHER): Payer: 59 | Admitting: Licensed Clinical Social Worker

## 2016-10-07 DIAGNOSIS — F329 Major depressive disorder, single episode, unspecified: Secondary | ICD-10-CM
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
## 2016-10-17 ENCOUNTER — Ambulatory Visit: Payer: 59 | Attending: Gynecology | Admitting: Gynecology

## 2016-10-17 ENCOUNTER — Encounter: Payer: Self-pay | Admitting: Gynecology

## 2016-10-17 VITALS — BP 124/61 | HR 80 | Temp 98.5°F | Resp 20 | Ht 62.91 in | Wt 241.9 lb

## 2016-10-17 DIAGNOSIS — Z90722 Acquired absence of ovaries, bilateral: Secondary | ICD-10-CM | POA: Insufficient documentation

## 2016-10-17 DIAGNOSIS — Z9049 Acquired absence of other specified parts of digestive tract: Secondary | ICD-10-CM | POA: Diagnosis not present

## 2016-10-17 DIAGNOSIS — K219 Gastro-esophageal reflux disease without esophagitis: Secondary | ICD-10-CM | POA: Diagnosis not present

## 2016-10-17 DIAGNOSIS — N891 Moderate vaginal dysplasia: Secondary | ICD-10-CM | POA: Diagnosis not present

## 2016-10-17 DIAGNOSIS — Z90710 Acquired absence of both cervix and uterus: Secondary | ICD-10-CM | POA: Insufficient documentation

## 2016-10-17 DIAGNOSIS — G43909 Migraine, unspecified, not intractable, without status migrainosus: Secondary | ICD-10-CM | POA: Diagnosis not present

## 2016-10-17 DIAGNOSIS — Z9079 Acquired absence of other genital organ(s): Secondary | ICD-10-CM | POA: Diagnosis not present

## 2016-10-17 DIAGNOSIS — F329 Major depressive disorder, single episode, unspecified: Secondary | ICD-10-CM | POA: Diagnosis not present

## 2016-10-17 DIAGNOSIS — N893 Dysplasia of vagina, unspecified: Secondary | ICD-10-CM | POA: Insufficient documentation

## 2016-10-17 DIAGNOSIS — E039 Hypothyroidism, unspecified: Secondary | ICD-10-CM | POA: Diagnosis not present

## 2016-10-17 DIAGNOSIS — N879 Dysplasia of cervix uteri, unspecified: Secondary | ICD-10-CM | POA: Insufficient documentation

## 2016-10-17 DIAGNOSIS — Z87442 Personal history of urinary calculi: Secondary | ICD-10-CM | POA: Diagnosis not present

## 2016-10-17 NOTE — Progress Notes (Signed)
Consult Note: Gyn-Onc   Dominique Nunez 51 y.o. female  Chief Complaint  Patient presents with  . CIN ll    Assessment :Moderate dysplasia of the vagina (multifocal)  Plan: Natural history of vaginal dysplasia was reviewed with the patient and the etiology (HPV). Given that this is a multifocal problem, think she will be best treated with intravaginal Efudex. She's given instruction sheet and a prescription for Efudex 5% to be applied at bedtime for consecutive nights and then repeated 1 month later. She will then return To Dr. Radene Knee for a repeat Pap smear approximately 4-6 weeks later.    HPI:51 year old white married female seen in consultation request of Dr. Arvella Nigh regarding management of a newly diagnosed V AIN 2 (biopsy-proven). The patient gives a history of having abnormal Pap smears for approximately 20 years. These are very intermittent and evaluation has not revealed any severe problems until recently. Recent Pap smear showed high-grade dysplasia and Dr. Radene Knee performed colposcopy and biopsies revealing moderate dysplasia. The patient is entirely asymptomatic.  Patient has a past history of a vaginal hysterectomy and subsequent laparoscopic bilateral salpingo-oophorectomy. She uses an estradiol patch changed weekly.   Review of Systems:10 point review of systems is negative except as noted in interval history.   Vitals: Blood pressure 124/61, pulse 80, temperature 98.5 F (36.9 C), temperature source Oral, resp. rate 20, height 5' 2.91" (1.598 m), weight 241 lb 14.4 oz (109.7 kg).  Physical Exam: General : The patient is a healthy woman in no acute distress.  HEENT: normocephalic, extraoccular movements normal; neck is supple without thyromegally  Lynphnodes: Supraclavicular and inguinal nodes not enlarged  Abdomen: Soft, non-tender, no ascites, no organomegally, no masses, no hernias  Pelvic:  EGBUS: Normal female  Vagina: Colposcopic examination of the entire vagina  shows multifocal lesions throughout consistent with vaginal dysplasia. Urethra and Bladder: Normal, non-tender  Cervix: Surgically absent  Uterus: Surgically absent  Bi-manual examination: Non-tender; no adenxal masses or nodularity  Rectal: normal sphincter tone, no masses, no blood  Lower extremities: No edema or varicosities. Normal range of motion    Procedure performed: Colposcopy of the vagina    No Known Allergies  Past Medical History:  Diagnosis Date  . Depression   . GERD (gastroesophageal reflux disease)   . Headache   . Heart murmur   . History of fainting spells of unknown cause   . Hypothyroidism   . Kidney stones   . Migraine   . PONV (postoperative nausea and vomiting)   . Thyroid disease     Past Surgical History:  Procedure Laterality Date  . ABDOMINAL HYSTERECTOMY  2000  . BREAST BIOPSY  1984  . CHOLECYSTECTOMY  2000  . CYSTOSCOPY N/A 06/26/2016   Procedure: CYSTOSCOPY;  Surgeon: Arvella Nigh, MD;  Location: [facility];  Service: Gynecology;  Laterality: N/A;  . INNER EAR SURGERY    . LAPAROSCOPIC SALPINGO OOPHERECTOMY Bilateral 06/26/2016   Procedure: LAPAROSCOPIC SALPINGO OOPHORECTOMY,BILATERAL;  Surgeon: Arvella Nigh, MD;  Location: [facility];  Service: Gynecology;  Laterality: Bilateral;  . TONSILLECTOMY AND ADENOIDECTOMY  1976  . WISDOM TOOTH EXTRACTION      Current Outpatient Prescriptions  Medication Sig Dispense Refill  . acetaminophen (TYLENOL) 650 MG CR tablet Take 1,300 mg by mouth 2 (two) times daily as needed for pain.    Marland Kitchen buPROPion (WELLBUTRIN XL) 300 MG 24 hr tablet TAKE 1 TABLET BY MOUTH  DAILY 90 tablet 3  . cetirizine (ZYRTEC) 10 MG tablet Take 10  mg by mouth daily.    . clonazePAM (KLONOPIN) 0.5 MG tablet TAKE 1 TABLET BY MOUTH DAILY AS NEEDED FOR ANXIETY 30 tablet 0  . estradiol (VIVELLE-DOT) 0.05 MG/24HR patch Place 1 patch onto the skin once a week.    Marland Kitchen ibuprofen (ADVIL,MOTRIN) 200 MG tablet Take 800 mg by mouth daily as needed for  moderate pain.    Marland Kitchen levothyroxine (SYNTHROID, LEVOTHROID) 88 MCG tablet Take 1 tablet (88 mcg total) by mouth daily. 90 tablet 3  . rizatriptan (MAXALT) 10 MG tablet Take 1 tablet (10 mg total) by mouth as needed for migraine. May repeat in 2 hours if needed 10 tablet 0  . sertraline (ZOLOFT) 100 MG tablet TAKE 1 TABLET BY MOUTH  DAILY 90 tablet 0  . traMADol (ULTRAM) 50 MG tablet Take 1 tablet by mouth daily as needed for moderate pain.     Marland Kitchen zolpidem (AMBIEN) 10 MG tablet Take 1 tablet (10 mg total) by mouth at bedtime as needed for sleep. 30 tablet 0   No current facility-administered medications for this visit.     Social History   Social History  . Marital status: Married    Spouse name: N/A  . Number of children: N/A  . Years of education: N/A   Occupational History  . Not on file.   Social History Main Topics  . Smoking status: Never Smoker  . Smokeless tobacco: Never Used  . Alcohol use No  . Drug use: No  . Sexual activity: Yes    Birth control/ protection: Surgical   Other Topics Concern  . Not on file   Social History Narrative  . No narrative on file    Family History  Problem Relation Age of Onset  . Cancer Mother   . Ovarian cancer Mother   . Asthma Mother   . COPD Mother   . Alcohol abuse Father   . Kidney disease Father   . Diabetes Father   . Kidney disease Brother   . Hepatitis C Brother   . Stroke Paternal Aunt   . Cancer Paternal Aunt   . Alcohol abuse Maternal Grandfather   . Alcohol abuse Paternal Grandfather   . Arthritis Paternal Grandfather   . Cancer Paternal Grandfather   . Heart disease Paternal Grandfather   . Stroke Paternal Grandfather       Marti Sleigh, MD 10/17/2016, 9:15 AM

## 2016-10-17 NOTE — Patient Instructions (Signed)
Please follow the instructions for the use of Efudex. You'll user for 4 nights in a row this month and then repeat again in 1 month. Then scheduled appointment to have a repeat Pap smear with Dr. Radene Knee approximate 4-6 weeks after completing the second cycle of Efudex.

## 2016-10-30 ENCOUNTER — Telehealth: Payer: Self-pay | Admitting: *Deleted

## 2016-10-30 NOTE — Telephone Encounter (Signed)
"  I'm having problems with my current treatment.  Efudex used for dysplasia.  Finished on Sunday.  Monday evening I began having burning, itching.  The opening is puffy, irritated and hurting down there.  It may not have all come out of me completely.  I have to repeat this again for four days the first week in May."  Call transferred to [place].  Nurse instructions: These symptoms are associated with Efudex.  Perhaps externally applying petroleum jelly may help this discomfort.

## 2016-10-30 NOTE — Telephone Encounter (Signed)
Called and discussed issues noted below by patient with Dr. Aldean Ast and his nurse Junius Roads. Left message in  Dominique Nunez's vm with the following recommendations to manage the skin irritation.  1. Take 600 mg of Aleve with food every 6 hours as needed for discomfort.   2. Use cool soaks 3-4 times a day. Take benadryl 25-50 mg at bedtime as needed. 3.  Use a squeeze bottle with lukewarm water to dilute urine and rinse peri area.  Pat dry. Use cotton underwear or no underwear to keep open to the air. Call if any difficulty wearing pants.  The area is firey red or has open sores.  She would need to be seen by physician to be  evaluated.

## 2016-10-31 NOTE — Telephone Encounter (Addendum)
Spoke with Dominique Nunez.  She is able to wear pants,however, urinating is difficult. Squeeze bottle not effective.  Suggested sitz bath and urinate into deeper water. She is noticing some blood on tissue after urination.  Dominique Nunez states it hurts just inside the vaginal opening.  She has not attempted  to visualize the area. Gave her an appointment with Dr. Denman George on Monday 11-03-16 to have her skin irritation evaluated. Dominique Nunez appreciative of f/u and appointment.

## 2016-11-02 NOTE — Progress Notes (Deleted)
Consult Note: Gyn-Onc   Dominique Nunez 51 y.o. female  No chief complaint on file.   Assessment :Moderate dysplasia of the vagina (multifocal)  Plan: Natural history of vaginal dysplasia was reviewed with the patient and the etiology (HPV). Given that this is a multifocal problem, think she will be best treated with intravaginal Efudex. She's given instruction sheet and a prescription for Efudex 5% to be applied at bedtime for consecutive nights and then repeated 1 month later. She will then return To Dr. Radene Knee for a repeat Pap smear approximately 4-6 weeks later.    HPI:51 year old white married female seen in consultation request of Dr. Arvella Nigh regarding management of a newly diagnosed V AIN 2 (biopsy-proven). The patient gives a history of having abnormal Pap smears for approximately 20 years. These are very intermittent and evaluation has not revealed any severe problems until recently. Recent Pap smear showed high-grade dysplasia and Dr. Radene Knee performed colposcopy and biopsies revealing moderate dysplasia. The patient is entirely asymptomatic.  Patient has a past history of a vaginal hysterectomy and subsequent laparoscopic bilateral salpingo-oophorectomy. She uses an estradiol patch changed weekly.   Review of Systems:10 point review of systems is negative except as noted in interval history.   Vitals: There were no vitals taken for this visit.  Physical Exam: General : The patient is a healthy woman in no acute distress.  HEENT: normocephalic, extraoccular movements normal; neck is supple without thyromegally  Lynphnodes: Supraclavicular and inguinal nodes not enlarged  Abdomen: Soft, non-tender, no ascites, no organomegally, no masses, no hernias  Pelvic:  EGBUS: Normal female  Vagina: Colposcopic examination of the entire vagina shows multifocal lesions throughout consistent with vaginal dysplasia. Urethra and Bladder: Normal, non-tender  Cervix: Surgically absent   Uterus: Surgically absent  Bi-manual examination: Non-tender; no adenxal masses or nodularity  Rectal: normal sphincter tone, no masses, no blood  Lower extremities: No edema or varicosities. Normal range of motion    Procedure performed: Colposcopy of the vagina    No Known Allergies  Past Medical History:  Diagnosis Date  . Depression   . GERD (gastroesophageal reflux disease)   . Headache   . Heart murmur   . History of fainting spells of unknown cause   . Hypothyroidism   . Kidney stones   . Migraine   . PONV (postoperative nausea and vomiting)   . Thyroid disease     Past Surgical History:  Procedure Laterality Date  . ABDOMINAL HYSTERECTOMY  2000  . BREAST BIOPSY  1984  . CHOLECYSTECTOMY  2000  . CYSTOSCOPY N/A 06/26/2016   Procedure: CYSTOSCOPY;  Surgeon: Arvella Nigh, MD;  Location: [facility];  Service: Gynecology;  Laterality: N/A;  . INNER EAR SURGERY    . LAPAROSCOPIC SALPINGO OOPHERECTOMY Bilateral 06/26/2016   Procedure: LAPAROSCOPIC SALPINGO OOPHORECTOMY,BILATERAL;  Surgeon: Arvella Nigh, MD;  Location: [facility];  Service: Gynecology;  Laterality: Bilateral;  . TONSILLECTOMY AND ADENOIDECTOMY  1976  . WISDOM TOOTH EXTRACTION      Current Outpatient Prescriptions  Medication Sig Dispense Refill  . acetaminophen (TYLENOL) 650 MG CR tablet Take 1,300 mg by mouth 2 (two) times daily as needed for pain.    Marland Kitchen buPROPion (WELLBUTRIN XL) 300 MG 24 hr tablet TAKE 1 TABLET BY MOUTH  DAILY 90 tablet 3  . cetirizine (ZYRTEC) 10 MG tablet Take 10 mg by mouth daily.    . clonazePAM (KLONOPIN) 0.5 MG tablet TAKE 1 TABLET BY MOUTH DAILY AS NEEDED FOR ANXIETY 30 tablet 0  .  estradiol (VIVELLE-DOT) 0.05 MG/24HR patch Place 1 patch onto the skin once a week.    Marland Kitchen ibuprofen (ADVIL,MOTRIN) 200 MG tablet Take 800 mg by mouth daily as needed for moderate pain.    Marland Kitchen levothyroxine (SYNTHROID, LEVOTHROID) 88 MCG tablet Take 1 tablet (88 mcg total) by mouth daily. 90 tablet 3  .  rizatriptan (MAXALT) 10 MG tablet Take 1 tablet (10 mg total) by mouth as needed for migraine. May repeat in 2 hours if needed 10 tablet 0  . sertraline (ZOLOFT) 100 MG tablet TAKE 1 TABLET BY MOUTH  DAILY 90 tablet 0  . traMADol (ULTRAM) 50 MG tablet Take 1 tablet by mouth daily as needed for moderate pain.     Marland Kitchen zolpidem (AMBIEN) 10 MG tablet Take 1 tablet (10 mg total) by mouth at bedtime as needed for sleep. 30 tablet 0   No current facility-administered medications for this visit.     Social History   Social History  . Marital status: Married    Spouse name: N/A  . Number of children: N/A  . Years of education: N/A   Occupational History  . Not on file.   Social History Main Topics  . Smoking status: Never Smoker  . Smokeless tobacco: Never Used  . Alcohol use No  . Drug use: No  . Sexual activity: Yes    Birth control/ protection: Surgical   Other Topics Concern  . Not on file   Social History Narrative  . No narrative on file    Family History  Problem Relation Age of Onset  . Cancer Mother   . Ovarian cancer Mother   . Asthma Mother   . COPD Mother   . Alcohol abuse Father   . Kidney disease Father   . Diabetes Father   . Kidney disease Brother   . Hepatitis C Brother   . Stroke Paternal Aunt   . Cancer Paternal Aunt   . Alcohol abuse Maternal Grandfather   . Alcohol abuse Paternal Grandfather   . Arthritis Paternal Grandfather   . Cancer Paternal Grandfather   . Heart disease Paternal Grandfather   . Stroke Paternal Rayann Heman, MD 11/02/2016, 10:31 PM

## 2016-11-03 ENCOUNTER — Ambulatory Visit: Payer: Self-pay | Admitting: Gynecologic Oncology

## 2016-11-03 ENCOUNTER — Telehealth: Payer: Self-pay | Admitting: *Deleted

## 2016-11-03 NOTE — Telephone Encounter (Signed)
I have called and left a message for the patient to call the office back to reschedule her appt. Patient cancelled appt for today, patient has the flu.

## 2016-11-18 ENCOUNTER — Ambulatory Visit (INDEPENDENT_AMBULATORY_CARE_PROVIDER_SITE_OTHER): Payer: 59 | Admitting: Psychiatry

## 2016-11-18 ENCOUNTER — Encounter: Payer: Self-pay | Admitting: Psychiatry

## 2016-11-18 VITALS — BP 125/77 | HR 84 | Temp 98.5°F | Wt 242.4 lb

## 2016-11-18 DIAGNOSIS — F332 Major depressive disorder, recurrent severe without psychotic features: Secondary | ICD-10-CM

## 2016-11-18 MED ORDER — BUPROPION HCL ER (XL) 150 MG PO TB24: 150.0000 mg | ORAL_TABLET | ORAL | 1 refills | Status: DC

## 2016-11-18 MED ORDER — SERTRALINE HCL 100 MG PO TABS: 150.0000 mg | ORAL_TABLET | Freq: Every day | ORAL | 1 refills | Status: DC

## 2016-11-18 NOTE — Progress Notes (Signed)
Psychiatric Initial Adult Assessment   Patient Identification: Dominique Nunez MRN:  [id number] Date of Evaluation:  11/18/2016 Referral Source:  Chief Complaint:   Chief Complaint    Establish Care; Depression; Anxiety     Visit Diagnosis:    ICD-9-CM ICD-10-CM   1. Severe episode of recurrent major depressive disorder, without psychotic features ([place]) 296.33 F33.2     History of Present Illness:  Patient is a 51 year old Caucasian woman with a long history of depression and anxiety who presents today for an evaluation after being referred by her primary care physician. She reports that the on her current regimen of antidepressants which are Wellbutrin and Zoloft she has not been doing well. States that she seemed to have some benefit initially but she has been quite depressed for a while now. She works at [employer] and has been there for 19 years and reports that she has a very stressful job. She also works part-time at [employer] and enjoys that. Reports that she does have trouble sleeping though she takes Ambien. Denies any suicidal thoughts. She denies problems with substance abuse. Denies any psychotic symptoms. She has 2 children and her older daughter is in college and her younger daughter is in high school. States that they're doing well academically. She has an okay relationship with her husband. Patient also reports having trouble concentrating and wanted to see if this physician would prescribe something for concentration. We discussed that the concentration problems can be due to untreated anxiety and depression.  Associated Signs/Symptoms: Depression Symptoms:  depressed mood, anhedonia, psychomotor agitation, fatigue, feelings of worthlessness/guilt, difficulty concentrating, hopelessness, anxiety, loss of energy/fatigue, disturbed sleep, (Hypo) Manic Symptoms:  Irritable Mood, Anxiety Symptoms:  Excessive Worry, Psychotic Symptoms:   PTSD Symptoms: denies  Past  Psychiatric History: No psychiatric hospitalizations or suicide attempts.  Previous Psychotropic Medications: Yes   Substance Abuse History in the last 12 months:  No.  Consequences of Substance Abuse: Negative  Past Medical History:  Past Medical History:  Diagnosis Date  . Anxiety   . Bipolar disorder ([place])   . Depression   . GERD (gastroesophageal reflux disease)   . Headache   . Heart murmur   . History of fainting spells of unknown cause   . Hypothyroidism   . Kidney stones   . Migraine   . PONV (postoperative nausea and vomiting)   . Thyroid disease     Past Surgical History:  Procedure Laterality Date  . ABDOMINAL HYSTERECTOMY  2000  . BREAST BIOPSY  1984  . CHOLECYSTECTOMY  2000  . CYSTOSCOPY N/A 06/26/2016   Procedure: CYSTOSCOPY;  Surgeon: Arvella Nigh, MD;  Location: [facility];  Service: Gynecology;  Laterality: N/A;  . INNER EAR SURGERY    . LAPAROSCOPIC SALPINGO OOPHERECTOMY Bilateral 06/26/2016   Procedure: LAPAROSCOPIC SALPINGO OOPHORECTOMY,BILATERAL;  Surgeon: Arvella Nigh, MD;  Location: [facility];  Service: Gynecology;  Laterality: Bilateral;  . TONSILLECTOMY AND ADENOIDECTOMY  1976  . WISDOM TOOTH EXTRACTION      Family Psychiatric History: Father and paternal grandfather had alcoholism problems. Her father shot himself in their basement in 1998. Mother overdosed on medications and called 911.  Family History:  Family History  Problem Relation Age of Onset  . Cancer Mother   . Ovarian cancer Mother   . Asthma Mother   . COPD Mother   . Alcohol abuse Father   . Kidney disease Father   . Diabetes Father   . Bipolar disorder Father   .  Anxiety disorder Father   . Depression Father   . Kidney disease Brother   . Hepatitis C Brother   . Alcohol abuse Brother   . Bipolar disorder Brother   . Anxiety disorder Brother   . Depression Brother   . Stroke Paternal Aunt   . Cancer Paternal Aunt   . Alcohol abuse Maternal Grandfather   . Alcohol abuse  Paternal Grandfather   . Arthritis Paternal Grandfather   . Cancer Paternal Grandfather   . Heart disease Paternal Grandfather   . Stroke Paternal Grandfather     Social History:   Social History   Social History  . Marital status: Married    Spouse name: N/A  . Number of children: N/A  . Years of education: N/A   Social History Main Topics  . Smoking status: Never Smoker  . Smokeless tobacco: Never Used  . Alcohol use No  . Drug use: No  . Sexual activity: Yes    Birth control/ protection: Surgical   Other Topics Concern  . None   Social History Narrative  . None    Additional Social History: Lives with her husband and daughter.  Allergies:  No Known Allergies  Metabolic Disorder Labs: No results found for: HGBA1C, MPG No results found for: PROLACTIN Lab Results  Component Value Date   CHOL 208 (H) 09/18/2016   TRIG 163 (H) 09/18/2016   HDL 61 09/18/2016   CHOLHDL 3.4 09/18/2016   LDLCALC 114 (H) 09/18/2016     Current Medications: Current Outpatient Prescriptions  Medication Sig Dispense Refill  . acetaminophen (TYLENOL) 650 MG CR tablet Take 1,300 mg by mouth 2 (two) times daily as needed for pain.    . cetirizine (ZYRTEC) 10 MG tablet Take 10 mg by mouth daily.    . clonazePAM (KLONOPIN) 0.5 MG tablet TAKE 1 TABLET BY MOUTH DAILY AS NEEDED FOR ANXIETY 30 tablet 0  . estradiol (VIVELLE-DOT) 0.05 MG/24HR patch Place 1 patch onto the skin once a week.    Marland Kitchen ibuprofen (ADVIL,MOTRIN) 200 MG tablet Take 800 mg by mouth daily as needed for moderate pain.    Marland Kitchen levothyroxine (SYNTHROID, LEVOTHROID) 88 MCG tablet Take 1 tablet (88 mcg total) by mouth daily. 90 tablet 3  . rizatriptan (MAXALT) 10 MG tablet Take 1 tablet (10 mg total) by mouth as needed for migraine. May repeat in 2 hours if needed 10 tablet 0  . sertraline (ZOLOFT) 100 MG tablet TAKE 1 TABLET BY MOUTH  DAILY 90 tablet 0  . traMADol (ULTRAM) 50 MG tablet Take 1 tablet by mouth daily as needed for  moderate pain.     Marland Kitchen zolpidem (AMBIEN) 10 MG tablet Take 1 tablet (10 mg total) by mouth at bedtime as needed for sleep. 30 tablet 0  . buPROPion (WELLBUTRIN XL) 300 MG 24 hr tablet TAKE 1 TABLET BY MOUTH  DAILY (Patient not taking: Reported on 11/18/2016) 90 tablet 3   No current facility-administered medications for this visit.     Neurologic: Headache: No Seizure: No Paresthesias:No  Musculoskeletal: Strength & Muscle Tone: within normal limits Gait & Station: normal Patient leans: N/A  Psychiatric Specialty Exam: ROS  Blood pressure 125/77, pulse 84, temperature 98.5 F (36.9 C), temperature source Oral, weight 242 lb 6.4 oz (110 kg).Body mass index is 43.06 kg/m.  General Appearance: Casual  Eye Contact:  Fair  Speech:  Clear and Coherent  Volume:  Normal  Mood:  Anxious, Depressed and Dysphoric  Affect:  Congruent  Thought Process:  Coherent  Orientation:  Full (Time, Place, and Person)  Thought Content:  Logical  Suicidal Thoughts:  No  Homicidal Thoughts:  No  Memory:  Immediate;   Fair Recent;   Fair Remote;   Fair  Judgement:  Fair  Insight:  Fair  Psychomotor Activity:  Normal  Concentration:  Concentration: Fair and Attention Span: Fair  Recall:  [employer] of Knowledge:Fair  Language: Fair  Akathisia:  No  Handed:  Right  AIMS (if indicated):  na  Assets:  Communication Skills Desire for Improvement Social Support Vocational/Educational  ADL's:  Intact  Cognition: WNL  Sleep:  Okay with the help of Ambien     Treatment Plan Summary:  Major depressive disorder severe and recurrent Increase Zoloft to 150 mg daily Decrease Wellbutrin to 150 mg daily Recommend that patient start to see a therapist but she reports that she does not do well with therapists  Patient given a referral to obtain testing for ADHD  Return to clinic in 4 weeks time or call before if needed    Elvin So, MD 5/8/20181:39 PM

## 2016-11-26 ENCOUNTER — Other Ambulatory Visit: Payer: Self-pay | Admitting: Family Medicine

## 2016-12-06 ENCOUNTER — Other Ambulatory Visit: Payer: Self-pay | Admitting: Family Medicine

## 2016-12-16 ENCOUNTER — Ambulatory Visit: Payer: 59 | Admitting: Psychiatry

## 2017-01-05 ENCOUNTER — Other Ambulatory Visit: Payer: Self-pay | Admitting: Family Medicine

## 2017-04-30 ENCOUNTER — Ambulatory Visit (INDEPENDENT_AMBULATORY_CARE_PROVIDER_SITE_OTHER): Payer: 59

## 2017-04-30 ENCOUNTER — Telehealth: Payer: Self-pay | Admitting: Family Medicine

## 2017-04-30 DIAGNOSIS — Z23 Encounter for immunization: Secondary | ICD-10-CM | POA: Diagnosis not present

## 2017-04-30 NOTE — Telephone Encounter (Signed)
Pt came in dropped off paperwork that needs completed. Please call when ready for pickup.   Placing in rx tower  Thank you

## 2017-06-02 ENCOUNTER — Other Ambulatory Visit: Payer: Self-pay | Admitting: Family Medicine

## 2017-07-16 DIAGNOSIS — R87610 Atypical squamous cells of undetermined significance on cytologic smear of cervix (ASC-US): Secondary | ICD-10-CM | POA: Diagnosis not present

## 2017-08-11 ENCOUNTER — Ambulatory Visit: Payer: BLUE CROSS/BLUE SHIELD | Admitting: Psychiatry

## 2017-08-11 ENCOUNTER — Encounter: Payer: Self-pay | Admitting: Psychiatry

## 2017-08-11 ENCOUNTER — Other Ambulatory Visit: Payer: Self-pay

## 2017-08-11 VITALS — BP 133/82 | HR 87 | Temp 97.3°F | Wt 246.4 lb

## 2017-08-11 DIAGNOSIS — F331 Major depressive disorder, recurrent, moderate: Secondary | ICD-10-CM

## 2017-08-11 DIAGNOSIS — F411 Generalized anxiety disorder: Secondary | ICD-10-CM

## 2017-08-11 MED ORDER — ARIPIPRAZOLE 5 MG PO TABS: 5.0000 mg | ORAL_TABLET | Freq: Every day | ORAL | 1 refills | Status: DC

## 2017-08-11 NOTE — Progress Notes (Signed)
Psychiatric Initial Adult Assessment   Patient Identification: Dominique Nunez MRN:  [id number] Date of Evaluation:  08/11/2017 Referral Source: Dominique Norris MD Chief Complaint:  ' I am depressed.'  Chief Complaint    Establish Care     Visit Diagnosis:    ICD-10-CM   1. MDD (major depressive disorder), recurrent episode, moderate (HCC) F33.1 ARIPiprazole (ABILIFY) 5 MG tablet    History of Present Illness: Dominique Nunez is a 52 year old Caucasian woman with a long term history of depression and anxiety, married, employed, lives in [place], presented to the clinic to reestablish care.  She  was seen by Dr. Einar Nunez for an initial assessment in May 2018 but she never followed up for further management of her medications after that.  Dominique Nunez today reports that she continues to struggle with depressive symptoms on a regular basis.  She reports she feels sad all the time.  She reports lack of motivation, feeling tired, low energy, poor appetite, trouble concentrating, anhedonia and so on.  She also reports sleep problems.  She struggles with sleep on a regular basis.  She reports her symptoms has been worsening since the past few months.  She also struggles with anxiety symptoms.  She reports she has a lot of racing thoughts about everything that can go wrong at her work on a regular basis.  She reports she is unable to shut down her brain when she tries to sleep at night.  She also reports she is kind of restless and impulsive during the day when she tries to function.  She reports she feels like she is all over the place when she tries to complete her work at home.  She reports she has been struggling with this kind of symptoms since the past several years.  She reports she had ADHD testing done by a provider in [place] a year ago.  She reports at that time they ruled out ADHD and did not feel like she had it.  She however reports she is unable to accept the fact that she does not have ADHD because she  continues to struggle with her symptoms on a regular basis.  She reports she takes medications like Wellbutrin, Zoloft as well as Ambien.  The Wellbutrin and Zoloft is for her mood symptoms.  She feels that Zoloft helps to some extent but does not know if the Wellbutrin is actually working or not.  She also reports she takes Ambien with good benefit for sleep.  She reports that as helpful.  She also reports taking Klonopin as needed prescribed by her OB/GYN.  She reports she takes it 3 times a week as needed at bedtime.  She reports that helps her to wind down and keeps her calm.  She reports several psychosocial stressors.  She reports several deaths in her family as a current stressor for her.  Her father shot himself in 1998 in the basement of her childhood home.  She reports her father struggled with mental illness as well as drug abuse.  Her father had a diagnosis of bipolar disorder.  She became very tearful when she discussed her father's death.  Her brother was an alcoholic and passed away from liver disease in 2015.  She only had 1 brother.  Her mother also passed away in the past due to medical causes-2011.  She reports she struggles with the death of her parents and her brother on a regular basis.  She lives with her husband with whom she has been married  since the past 26 years.  She reports they have an okay relationship.  She does struggle with some relational stressors but she does not think that is is due to him but rather blames her depression for the same.  She has 2 adult daughters who are in college and they are doing well.  She works at [employer]. has been doing so since the past 19 years or so.  She reports her work is very stressful.  Associated Signs/Symptoms: Depression Symptoms:  depressed mood, psychomotor retardation, fatigue, difficulty concentrating, anxiety, (Hypo) Manic Symptoms:  denies Anxiety Symptoms:  generalized anxiety Psychotic Symptoms:  denies PTSD  Symptoms: Had a traumatic exposure:  as noted above  Past Psychiatric History: Patient denies any history of inpatient mental health admissions or past suicide attempts.  Her OB/GYN has been prescribing her psychotropic medications including Wellbutrin, Zoloft, Ambien as well as Klonopin.  She does report a history of psychological testing done in the past to rule out ADHD.  She also reports a past diagnosis of bipolar disorder by a provider in [place], she does not remember the name.  She reports she was started on lithium Topamax at that time but she did not take the medication for long.  Previous Psychotropic Medications: Yes lithium, Topamax, Wellbutrin, Zoloft, Klonopin  Substance Abuse History in the last 12 months:  No.  Consequences of Substance Abuse: Negative  Past Medical History:  Past Medical History:  Diagnosis Date  . Anxiety   . Bipolar disorder ([place])   . Depression   . GERD (gastroesophageal reflux disease)   . Headache   . Heart murmur   . History of fainting spells of unknown cause   . Hypothyroidism   . Kidney stones   . Migraine   . PONV (postoperative nausea and vomiting)   . Thyroid disease     Past Surgical History:  Procedure Laterality Date  . ABDOMINAL HYSTERECTOMY  2000  . BREAST BIOPSY  1984  . CHOLECYSTECTOMY  2000  . CYSTOSCOPY N/A 06/26/2016   Procedure: CYSTOSCOPY;  Surgeon: Arvella Nigh, MD;  Location: [facility];  Service: Gynecology;  Laterality: N/A;  . INNER EAR SURGERY    . LAPAROSCOPIC SALPINGO OOPHERECTOMY Bilateral 06/26/2016   Procedure: LAPAROSCOPIC SALPINGO OOPHORECTOMY,BILATERAL;  Surgeon: Arvella Nigh, MD;  Location: [facility];  Service: Gynecology;  Laterality: Bilateral;  . TONSILLECTOMY AND ADENOIDECTOMY  1976  . WISDOM TOOTH EXTRACTION      Family Psychiatric History: Father-bipolar disorder, anxiety disorder, alcoholism, Brother-alcohol abuse.  Her father committed suicide by shooting himself in 73.  Family History:  Family  History  Problem Relation Age of Onset  . Cancer Mother   . Ovarian cancer Mother   . Asthma Mother   . COPD Mother   . Alcohol abuse Father   . Kidney disease Father   . Diabetes Father   . Bipolar disorder Father   . Anxiety disorder Father   . Depression Father   . Kidney disease Brother   . Hepatitis C Brother   . Alcohol abuse Brother   . Bipolar disorder Brother   . Anxiety disorder Brother   . Depression Brother   . Stroke Paternal Aunt   . Cancer Paternal Aunt   . Alcohol abuse Maternal Grandfather   . Alcohol abuse Paternal Grandfather   . Arthritis Paternal Grandfather   . Cancer Paternal Grandfather   . Heart disease Paternal Grandfather   . Stroke Paternal Grandfather     Social History:  Social History   Socioeconomic History  . Marital status: Married    Spouse name: Photographer  . Number of children: 2  . Years of education: None  . Highest education level: Bachelor's degree (e.g., BA, AB, BS)  Social Needs  . Financial resource strain: Not hard at all  . Food insecurity - worry: Never true  . Food insecurity - inability: Never true  . Transportation needs - medical: No  . Transportation needs - non-medical: No  Occupational History    Comment: full time  Tobacco Use  . Smoking status: Never Smoker  . Smokeless tobacco: Never Used  Substance and Sexual Activity  . Alcohol use: No  . Drug use: No  . Sexual activity: Yes    Birth control/protection: Surgical  Other Topics Concern  . None  Social History Narrative  . None    Additional Social History: She is married since the past 26 years.  She lives in [place] with her husband.  She has 2 adult daughters who are doing well in college.  She works at [employer]. since the past 19 years or so.  Both her parents passed away.  Her mother passed away due to COPD complications in [id number].  Her father who had bipolar disorder/alcohol use killed himself by a gunshot wound in 11/06/1996.  Her brother passed away  from complications of liver disease-alcohol abuse or in 2013-11-06.  Allergies:  No Known Allergies  Metabolic Disorder Labs: No results found for: HGBA1C, MPG No results found for: PROLACTIN Lab Results  Component Value Date   CHOL 208 (H) 09/18/2016   TRIG 163 (H) 09/18/2016   HDL 61 09/18/2016   CHOLHDL 3.4 09/18/2016   LDLCALC 114 (H) 09/18/2016     Current Medications: Current Outpatient Medications  Medication Sig Dispense Refill  . acetaminophen (TYLENOL) 650 MG CR tablet Take 1,300 mg by mouth 2 (two) times daily as needed for pain.    . ARIPiprazole (ABILIFY) 5 MG tablet Take 1 tablet (5 mg total) by mouth at bedtime. 30 tablet 1  . buPROPion (WELLBUTRIN XL) 300 MG 24 hr tablet Take by mouth.    . cetirizine (ZYRTEC) 10 MG tablet Take 10 mg by mouth daily.    . clonazePAM (KLONOPIN) 0.5 MG tablet TAKE 1 TABLET BY MOUTH DAILY AS NEEDED FOR ANXIETY 30 tablet 0  . estradiol (VIVELLE-DOT) 0.05 MG/24HR patch Place 1 patch onto the skin once a week.    Marland Kitchen ibuprofen (ADVIL,MOTRIN) 200 MG tablet Take 800 mg by mouth daily as needed for moderate pain.    Marland Kitchen levothyroxine (SYNTHROID, LEVOTHROID) 88 MCG tablet TAKE 1 TABLET BY MOUTH  DAILY 90 tablet 3  . PROCTOZONE-HC 2.5 % rectal cream U REC BID  1  . rizatriptan (MAXALT) 10 MG tablet Take 1 tablet (10 mg total) by mouth as needed for migraine. May repeat in 2 hours if needed 10 tablet 0  . sertraline (ZOLOFT) 100 MG tablet TAKE 1 TABLET BY MOUTH  DAILY 90 tablet 1  . zolpidem (AMBIEN) 10 MG tablet Take 1 tablet (10 mg total) by mouth at bedtime as needed for sleep. 30 tablet 0   No current facility-administered medications for this visit.     Neurologic: Headache: Yes Seizure: No Paresthesias:No  Musculoskeletal: Strength & Muscle Tone: within normal limits Gait & Station: normal Patient leans: N/A  Psychiatric Specialty Exam: Review of Systems  Psychiatric/Behavioral: Positive for depression. The patient is nervous/anxious.    All other systems reviewed and  are negative.   Blood pressure 133/82, pulse 87, temperature (!) 97.3 F (36.3 C), temperature source Oral, weight 246 lb 6.4 oz (111.8 kg).Body mass index is 43.77 kg/m.  General Appearance: Casual  Eye Contact:  Fair  Speech:  Clear and Coherent  Volume:  Normal  Mood:  Anxious, Depressed and Dysphoric  Affect:  Tearful  Thought Process:  Goal Directed and Descriptions of Associations: Intact  Orientation:  Full (Time, Place, and Person)  Thought Content:  Logical  Suicidal Thoughts:  No  Homicidal Thoughts:  No  Memory:  Immediate;   Fair Recent;   Fair Remote;   Fair  Judgement:  Fair  Insight:  Fair  Psychomotor Activity:  Normal  Concentration:  Concentration: Fair and Attention Span: Fair  Recall:  [employer] of Knowledge:Fair  Language: Fair  Akathisia:  No  Handed:  Right  AIMS (if indicated):  NA  Assets:  Communication Skills Desire for Improvement Financial Resources/Insurance Housing Social Support Talents/Skills Transportation  ADL's:  Intact  Cognition: WNL  Sleep:  Ok on medications    Treatment Plan Summary: Aubrielle is a 52 year old Caucasian female who has a history of depression, anxiety, sleep issues, presented to the clinic today to establish care.  Taffy continues to struggle with depressive symptoms, anxiety issues, inability to focus and so on.  She has a past diagnosis of bipolar disorder which she states she does not accept.  She continues to believe she has ADHD even though she had testing done in the past and it was ruled out.  She denies any suicidality.  She denies any substance abuse problems.  She does have a history of trauma, given the suicidal death of her father years ago.  She also had several other deaths in her family including her mother and her brother recently.  She does have good social support from her family, her husband and her daughters were doing well.  She is employed but she continues to struggle  at work due to her mental health issues.  She is a good candidate at this time for outpatient treatment plan as noted below. Medication management and Plan see below Plan  For MDD Continue Wellbutrin XL 300 mg daily.  Prescribed by her OB/GYN. Continue Zoloft 100 mg daily.  Prescribed by her OB/GYN. Add Abilify 5 mg p.o. nightly to augment her anti-depressants. Provided medication education, provided handouts. PHQ 9 equals 21  For generalized anxiety disorder Continue Zoloft 100 mg p.o. daily She is also on Klonopin 0.5 mg daily as needed.  She reports she takes it 3 times a week.  Prescribed by her OB/GYN.  Discussed with the patient the risk of being on benzodiazepine therapy for long-term.  Also discussed with her to gradually wean it off. GAD 7 = 16  Insomnia Continue Ambien 10 mg p.o. nightly as needed.  Prescribed by her OB/GYN.  She was also able to complete a mood disorder questionnaire, because of the concern of past diagnosis of bipolar disorder.  However she scored low on the same.  However will continue to monitor patient closely.  The patient reports she is interested in ADHD testing again.  We will give her the information for Dr. Lynann Beaver.  Also given information for Ms. Royal Piedra for psychotherapy.  Follow-up in clinic in 4 weeks or sooner if needed.  More than 50 % of the time was spent for psychoeducation and supportive psychotherapy and care coordination.  This note was generated in part or  whole with voice recognition software. Voice recognition is usually quite accurate but there are transcription errors that can and very often do occur. I apologize for any typographical errors that were not detected and corrected.       Ursula Alert, MD 1/29/20194:17 PM

## 2017-08-11 NOTE — Patient Instructions (Signed)
Aripiprazole tablets What is this medicine? ARIPIPRAZOLE (ay ri PIP ray zole) is an atypical antipsychotic. It is used to treat schizophrenia and bipolar disorder, also known as manic-depression. It is also used to treat Tourette's disorder and some symptoms of autism. This medicine may also be used in combination with antidepressants to treat major depressive disorder. This medicine may be used for other purposes; ask your health care provider or pharmacist if you have questions. COMMON BRAND NAME(S): Abilify What should I tell my health care provider before I take this medicine? They need to know if you have any of these conditions: -dehydration -dementia -diabetes -heart disease -history of stroke -low blood counts, like low white cell, platelet, or red cell counts -Parkinson's disease -seizures -suicidal thoughts, plans, or attempt; a previous suicide attempt by you or a family member -an unusual or allergic reaction to aripiprazole, other medicines, foods, dyes, or preservatives -pregnant or trying to get pregnant -breast-feeding How should I use this medicine? Take this medicine by mouth with a glass of water. Follow the directions on the prescription label. You can take this medicine with or without food. Take your doses at regular intervals. Do not take your medicine more often than directed. Do not stop taking except on the advice of your doctor or health care professional. A special MedGuide will be given to you by the pharmacist with each prescription and refill. Be sure to read this information carefully each time. Talk to your pediatrician regarding the use of this medicine in children. While this drug may be prescribed for children as young as 6 years of age for selected conditions, precautions do apply. Overdosage: If you think you have taken too much of this medicine contact a poison control center or emergency room at once. NOTE: This medicine is only for you. Do not share  this medicine with others. What if I miss a dose? If you miss a dose, take it as soon as you can. If it is almost time for your next dose, take only that dose. Do not take double or extra doses. What may interact with this medicine? Do not take this medicine with any of the following medications: -brexpiprazole -cisapride -dofetilide -dronedarone -metoclopramide -pimozide -thioridazine This medicine may also interact with the following medications: -alcohol -carbamazepine -certain medicines for anxiety or sleep -certain medicines for blood pressure -certain medicines for fungal infections like ketoconazole, fluconazole, posaconazole, and itraconazole -clarithromycin -fluoxetine -other medicines that prolong the QT interval (cause an abnormal heart rhythm) -paroxetine -quinidine -rifampin This list may not describe all possible interactions. Give your health care provider a list of all the medicines, herbs, non-prescription drugs, or dietary supplements you use. Also tell them if you smoke, drink alcohol, or use illegal drugs. Some items may interact with your medicine. What should I watch for while using this medicine? Visit your doctor or health care professional for regular checks on your progress. It may be several weeks before you see the full effects of this medicine. Do not suddenly stop taking this medicine. You may need to gradually reduce the dose. Patients and their families should watch out for worsening depression or thoughts of suicide. Also watch out for sudden changes in feelings such as feeling anxious, agitated, panicky, irritable, hostile, aggressive, impulsive, severely restless, overly excited and hyperactive, or not being able to sleep. If this happens, especially at the beginning of antidepressant treatment or after a change in dose, call your health care professional. You may get dizzy or drowsy. Do   not drive, use machinery, or do anything that needs mental  alertness until you know how this medicine affects you. Do not stand or sit up quickly, especially if you are an older patient. This reduces the risk of dizzy or fainting spells. Alcohol can increase dizziness and drowsiness. Avoid alcoholic drinks. This medicine can reduce the response of your body to heat or cold. Dress warm in cold weather and stay hydrated in hot weather. If possible, avoid extreme temperatures like saunas, hot tubs, very hot or cold showers, or activities that can cause dehydration such as vigorous exercise. This medicine may cause dry eyes and blurred vision. If you wear contact lenses you may feel some discomfort. Lubricating drops may help. See your eye doctor if the problem does not go away or is severe. If you notice an increased hunger or thirst, different from your normal hunger or thirst, or if you find that you have to urinate more frequently, you should contact your health care provider as soon as possible. You may need to have your blood sugar monitored. This medicine may cause changes in your blood sugar levels. You should monitor you blood sugar frequently if you have diabetes. There have been reports of uncontrollable and strong urges to gamble, binge eat, shop, and have sex while taking this medicine. If you experience any of these or other uncontrollable and strong urges while taking this medicine, you should report it to your health care provider as soon as possible. What side effects may I notice from receiving this medicine? Side effects that you should report to your doctor or health care professional as soon as possible: -allergic reactions like skin rash, itching or hives, swelling of the face, lips, or tongue -breathing problems -confusion -feeling faint or lightheaded, falls -fever or chills, sore throat -increased hunger or thirst -increased urination -joint pain -muscles pain, spasms -problems with balance, talking, walking -restlessness or need to  keep moving -seizures -suicidal thoughts or other mood changes -trouble swallowing -uncontrollable and excessive urges (examples: gambling, binge eating, shopping, having sex) -uncontrollable head, mouth, neck, arm, or leg movements -unusually weak or tired Side effects that usually do not require medical attention (report to your doctor or health care professional if they continue or are bothersome): -blurred vision -constipation -headache -nausea, vomiting -trouble sleeping -weight gain This list may not describe all possible side effects. Call your doctor for medical advice about side effects. You may report side effects to FDA at 1-800-FDA-1088. Where should I keep my medicine? Keep out of the reach of children. Store at room temperature between 15 and 30 degrees C (59 and 86 degrees F). Throw away any unused medicine after the expiration date. NOTE: This sheet is a summary. It may not cover all possible information. If you have questions about this medicine, talk to your doctor, pharmacist, or health care provider.  2018 Elsevier/Gold Standard (2016-06-15 11:45:05)  

## 2017-08-12 ENCOUNTER — Encounter: Payer: Self-pay | Admitting: Psychiatry

## 2017-08-19 DIAGNOSIS — Z85828 Personal history of other malignant neoplasm of skin: Secondary | ICD-10-CM | POA: Diagnosis not present

## 2017-08-27 ENCOUNTER — Encounter: Payer: Self-pay | Admitting: Family Medicine

## 2017-09-09 DIAGNOSIS — F900 Attention-deficit hyperactivity disorder, predominantly inattentive type: Secondary | ICD-10-CM | POA: Diagnosis not present

## 2017-09-23 DIAGNOSIS — F900 Attention-deficit hyperactivity disorder, predominantly inattentive type: Secondary | ICD-10-CM | POA: Diagnosis not present

## 2017-09-30 ENCOUNTER — Other Ambulatory Visit: Payer: Self-pay | Admitting: Family Medicine

## 2017-09-30 DIAGNOSIS — F900 Attention-deficit hyperactivity disorder, predominantly inattentive type: Secondary | ICD-10-CM | POA: Diagnosis not present

## 2017-10-03 ENCOUNTER — Other Ambulatory Visit: Payer: Self-pay | Admitting: Psychiatry

## 2017-10-03 DIAGNOSIS — F331 Major depressive disorder, recurrent, moderate: Secondary | ICD-10-CM

## 2017-10-07 DIAGNOSIS — F900 Attention-deficit hyperactivity disorder, predominantly inattentive type: Secondary | ICD-10-CM | POA: Diagnosis not present

## 2017-10-15 ENCOUNTER — Other Ambulatory Visit: Payer: Self-pay

## 2017-10-15 ENCOUNTER — Ambulatory Visit: Payer: BLUE CROSS/BLUE SHIELD | Admitting: Psychiatry

## 2017-10-15 ENCOUNTER — Encounter: Payer: Self-pay | Admitting: Psychiatry

## 2017-10-15 VITALS — BP 124/85 | HR 83 | Temp 98.7°F | Wt 248.8 lb

## 2017-10-15 DIAGNOSIS — F902 Attention-deficit hyperactivity disorder, combined type: Secondary | ICD-10-CM | POA: Diagnosis not present

## 2017-10-15 DIAGNOSIS — F331 Major depressive disorder, recurrent, moderate: Secondary | ICD-10-CM | POA: Diagnosis not present

## 2017-10-15 DIAGNOSIS — F411 Generalized anxiety disorder: Secondary | ICD-10-CM | POA: Diagnosis not present

## 2017-10-15 MED ORDER — HYDROXYZINE PAMOATE 25 MG PO CAPS: 25.0000 mg | ORAL_CAPSULE | Freq: Every day | ORAL | 1 refills | Status: DC | PRN

## 2017-10-15 NOTE — Progress Notes (Signed)
[facility] MD OP Progress Note  10/15/2017 12:39 PM Dominique Nunez  MRN:  [id number]  Chief Complaint: ' I am doing well." Chief Complaint    Follow-up; Medication Refill     HPI: Dominique Nunez is a 52 year old Caucasian female  with a long time history of depression, anxiety, married, employed, lives in [place], presented to the clinic today for a follow-up visit.  Used to see Dr. Einar Nunez in the past in May 2018.  Patient recently was seen by Probation officer for an initial evaluation in January 2019.  Patient today reports her depressive symptoms have improved.  She reports adding the Abilify changed everything.  She reports she has more energy and motivation.  She reports she is not as depressed as she used to feel before.  She also reports sleep is good.  She continues to need the Ambien which was prescribed by her OB/GYN.  Patient reports she also has a history of generalized anxiety disorder.  She is a Research officer, trade union and she worries about everything to the extreme.  She however reports she feels better now with regards to her anxiety also.  She has not been using the Klonopin as she used to before.  She reports she was taking Klonopin every day in the past.  However she has been weaning herself off , the last use of Klonopin was 2 weeks ago.  Patient had ADHD testing with Dr. Lynann Nunez.  Patient as per testing that was done has a diagnosis of ADHD combined type.  The testing report was discussed with patient.  Patient was provided with treatment options for ADHD.  Patient reports she would like to continue on the Wellbutrin at this time.  Patient denies any other concerns today.     Visit Diagnosis:    ICD-10-CM   1. MDD (major depressive disorder), recurrent episode, moderate (HCC) F33.1   2. Attention deficit hyperactivity disorder (ADHD), combined type F90.2   3. GAD (generalized anxiety disorder) F41.1 hydrOXYzine (VISTARIL) 25 MG capsule    Past Psychiatric History: Patient denies any history of inpatient  mental health admissions or past suicide attempts.  Her OB/GYN has been prescribing her psychotropic medications in the past including Wellbutrin, Zoloft, Ambien as well as Klonopin.  She does report a history of psychological testing done in the past to rule out ADHD.  She also reports a past diagnosis of bipolar disorder by a provider in [place].  She was on medications in the past like lithium, Topamax, Wellbutrin, Zoloft, Klonopin. Patient had recent ADHD testing done by Dr. Lynann Nunez march 23 rd , 2019.  Past Medical History:  Past Medical History:  Diagnosis Date  . Anxiety   . Bipolar disorder ([place])   . Depression   . GERD (gastroesophageal reflux disease)   . Headache   . Heart murmur   . History of fainting spells of unknown cause   . Hypothyroidism   . Kidney stones   . Migraine   . PONV (postoperative nausea and vomiting)   . Thyroid disease     Past Surgical History:  Procedure Laterality Date  . ABDOMINAL HYSTERECTOMY  2000  . BREAST BIOPSY  1984  . CHOLECYSTECTOMY  2000  . CYSTOSCOPY N/A 06/26/2016   Procedure: CYSTOSCOPY;  Surgeon: Dominique Nigh, MD;  Location: [facility];  Service: Gynecology;  Laterality: N/A;  . INNER EAR SURGERY    . LAPAROSCOPIC SALPINGO OOPHERECTOMY Bilateral 06/26/2016   Procedure: LAPAROSCOPIC SALPINGO OOPHORECTOMY,BILATERAL;  Surgeon: Dominique Nigh, MD;  Location: [facility];  Service: Gynecology;  Laterality: Bilateral;  . TONSILLECTOMY AND ADENOIDECTOMY  1976  . WISDOM TOOTH EXTRACTION      Family Psychiatric History: Father-bipolar disorder, anxiety disorder, alcoholism.  Brother-alcohol abuse.  Her father committed suicide by shooting himself in 90.  Family History:  Family History  Problem Relation Age of Onset  . Cancer Mother   . Ovarian cancer Mother   . Asthma Mother   . COPD Mother   . Alcohol abuse Father   . Kidney disease Father   . Diabetes Father   . Bipolar disorder Father   . Anxiety disorder Father   . Depression  Father   . Kidney disease Brother   . Hepatitis C Brother   . Alcohol abuse Brother   . Bipolar disorder Brother   . Anxiety disorder Brother   . Depression Brother   . Stroke Paternal Aunt   . Cancer Paternal Aunt   . Alcohol abuse Maternal Grandfather   . Alcohol abuse Paternal Grandfather   . Arthritis Paternal Grandfather   . Cancer Paternal Grandfather   . Heart disease Paternal Grandfather   . Stroke Paternal Grandfather    Substance abuse history: Denies  Social History: She is married since the past 57 years.  She lives in [place] with her husband.  She has 2 daughters who are doing well in college.  She works at [employer]. since the past 19 years or so.  Both her parents passed away.  Her mother passed away due to COPD complications [id number].  Father who had bipolar disorder-alcohol use, killed himself by gunshot wound in 11/08/1996.  Her brother passed away from complications of liver disease-alcohol abuse in 11/08/13. Social History   Socioeconomic History  . Marital status: Married    Spouse name: Dominique Nunez  . Number of children: 2  . Years of education: Not on file  . Highest education level: Bachelor's degree (e.g., BA, AB, BS)  Occupational History    Comment: full time  Social Needs  . Financial resource strain: Not hard at all  . Food insecurity:    Worry: Never true    Inability: Never true  . Transportation needs:    Medical: No    Non-medical: No  Tobacco Use  . Smoking status: Never Smoker  . Smokeless tobacco: Never Used  Substance and Sexual Activity  . Alcohol use: No  . Drug use: No  . Sexual activity: Yes    Birth control/protection: Surgical  Lifestyle  . Physical activity:    Days per week: 0 days    Minutes per session: 0 min  . Stress: Rather much  Relationships  . Social connections:    Talks on phone: Once a week    Gets together: Never    Attends religious service: Never    Active member of club or organization: No    Attends meetings of  clubs or organizations: Never    Relationship status: Married  Other Topics Concern  . Not on file  Social History Narrative  . Not on file    Allergies: No Known Allergies  Metabolic Disorder Labs: No results found for: HGBA1C, MPG No results found for: PROLACTIN Lab Results  Component Value Date   CHOL 208 (H) 09/18/2016   TRIG 163 (H) 09/18/2016   HDL 61 09/18/2016   CHOLHDL 3.4 09/18/2016   LDLCALC 114 (H) 09/18/2016   Lab Results  Component Value Date   TSH 3.430 09/18/2016   TSH 5.080 (H) 03/04/2016  Therapeutic Level Labs: No results found for: LITHIUM No results found for: VALPROATE No components found for:  CBMZ  Current Medications: Current Outpatient Medications  Medication Sig Dispense Refill  . acetaminophen (TYLENOL) 650 MG CR tablet Take 1,300 mg by mouth 2 (two) times daily as needed for pain.    . ARIPiprazole (ABILIFY) 5 MG tablet TAKE 1 TABLET(5 MG) BY MOUTH AT BEDTIME 90 tablet 0  . buPROPion (WELLBUTRIN XL) 300 MG 24 hr tablet Take 1 tablet (300 mg total) by mouth daily. Needs appt for future fills 90 tablet 0  . cetirizine (ZYRTEC) 10 MG tablet Take 10 mg by mouth daily.    . clonazePAM (KLONOPIN) 0.5 MG tablet TAKE 1 TABLET BY MOUTH DAILY AS NEEDED FOR ANXIETY 30 tablet 0  . estradiol (VIVELLE-DOT) 0.05 MG/24HR patch Place 1 patch onto the skin once a week.    . hydrOXYzine (VISTARIL) 25 MG capsule Take 1 capsule (25 mg total) by mouth daily as needed (severe anxiety). 30 capsule 1  . ibuprofen (ADVIL,MOTRIN) 200 MG tablet Take 800 mg by mouth daily as needed for moderate pain.    Marland Kitchen levothyroxine (SYNTHROID, LEVOTHROID) 88 MCG tablet TAKE 1 TABLET BY MOUTH  DAILY 90 tablet 3  . PROCTOZONE-HC 2.5 % rectal cream U REC BID  1  . rizatriptan (MAXALT) 10 MG tablet Take 1 tablet (10 mg total) by mouth as needed for migraine. May repeat in 2 hours if needed 10 tablet 0  . sertraline (ZOLOFT) 100 MG tablet TAKE 1 TABLET BY MOUTH  DAILY 90 tablet 1  .  zolpidem (AMBIEN) 10 MG tablet Take 1 tablet (10 mg total) by mouth at bedtime as needed for sleep. 30 tablet 0   No current facility-administered medications for this visit.      Musculoskeletal: Strength & Muscle Tone: within normal limits Gait & Station: normal Patient leans: N/A  Psychiatric Specialty Exam: Review of Systems  Psychiatric/Behavioral: Positive for depression. The patient is nervous/anxious.   All other systems reviewed and are negative.   Blood pressure 124/85, pulse 83, temperature 98.7 F (37.1 C), temperature source Oral, weight 248 lb 12.8 oz (112.9 kg).Body mass index is 44.19 kg/m.  General Appearance: Casual  Eye Contact:  Fair  Speech:  Clear and Coherent  Volume:  Normal  Mood:  Anxious  Affect:  Congruent  Thought Process:  Goal Directed and Descriptions of Associations: Intact  Orientation:  Full (Time, Place, and Person)  Thought Content: Logical   Suicidal Thoughts:  No  Homicidal Thoughts:  No  Memory:  Immediate;   Fair Recent;   Fair Remote;   Fair  Judgement:  Fair  Insight:  Fair  Psychomotor Activity:  Normal  Concentration:  Concentration: Fair and Attention Span: Fair  Recall:  [employer] of Knowledge: Fair  Language: Fair  Akathisia:  No  Handed:  Right  AIMS (if indicated):   Assets:  Communication Skills Desire for [place] Talents/Skills Transportation  ADL's:  Intact  Cognition: WNL  Sleep:  Fair   Screenings: PHQ2-9     Office Visit from 09/18/2016 in [place] at [facility]  PHQ-2 Total Score  2  PHQ-9 Total Score  6       Assessment and Plan: Deema is a 52 year old Caucasian female who has a history of depression, anxiety, sleep issues, presented to the clinic today for a follow-up visit.  Patient today reports her depressive symptoms as well as anxiety symptoms have improved  on the current medications.  She is tolerating the Abilify well.  She also had ADHD testing done  and meets criteria for ADHD.  This was discussed with patient.  Continues to have good social support from her family and is currently doing well.  Will continue plan as noted below.  Plan  MDD Continue Wellbutrin XL 300 mg p.o. daily.  Prescribed by OB/GYN. Zoloft 100 mg p.o. daily.  Prescribed by OB/GYN Continue Abilify 5 mg p.o. nightly PHQ 9 equals 11  For GAD Continue Zoloft 100 mg p.o. daily She continues to wean off Klonopin, last use was 2 weeks ago. We will start Vistaril 25 mg p.o. daily as needed for severe anxiety symptoms GAD 7 equals 8  For insomnia Continue Ambien 10 mg p.o. nightly as needed, prescribed by OB/GYN  For ADHD combined type We will continue Wellbutrin XL 300 mg p.o. daily for now. Discussed other treatment options with patient Discussed her ADHD testing report by Dr. Lynann Nunez with patient.  Follow up in clinic in a month from now.  More than 50 % of the time was spent for psychoeducation and supportive psychotherapy and care coordination.  This note was generated in part or whole with voice recognition software. Voice recognition is usually quite accurate but there are transcription errors that can and very often do occur. I apologize for any typographical errors that were not detected and corrected.       Ursula Alert, MD 10/15/2017, 12:39 PM

## 2017-10-15 NOTE — Patient Instructions (Signed)
Hydroxyzine capsules or tablets What is this medicine? HYDROXYZINE (hye [place] i zeen) is an antihistamine. This medicine is used to treat allergy symptoms. It is also used to treat anxiety and tension. This medicine can be used with other medicines to induce sleep before surgery. This medicine may be used for other purposes; ask your health care provider or pharmacist if you have questions. COMMON BRAND NAME(S): ANX, Atarax, Rezine, Vistaril What should I tell my health care provider before I take this medicine? They need to know if you have any of these conditions: -any chronic illness -difficulty passing urine -glaucoma -heart disease -kidney disease -liver disease -lung disease -an unusual or allergic reaction to hydroxyzine, cetirizine, other medicines, foods, dyes, or preservatives -pregnant or trying to get pregnant -breast-feeding How should I use this medicine? Take this medicine by mouth with a full glass of water. Follow the directions on the prescription label. You may take this medicine with food or on an empty stomach. Take your medicine at regular intervals. Do not take your medicine more often than directed. Talk to your pediatrician regarding the use of this medicine in children. Special care may be needed. While this drug may be prescribed for children as young as 75 years of age for selected conditions, precautions do apply. Patients over 62 years old may have a stronger reaction and need a smaller dose. Overdosage: If you think you have taken too much of this medicine contact a poison control center or emergency room at once. NOTE: This medicine is only for you. Do not share this medicine with others. What if I miss a dose? If you miss a dose, take it as soon as you can. If it is almost time for your next dose, take only that dose. Do not take double or extra doses. What may interact with this medicine? -alcohol -barbiturate medicines for sleep or seizures -medicines for  colds, allergies -medicines for depression, anxiety, or emotional disturbances -medicines for pain -medicines for sleep -muscle relaxants This list may not describe all possible interactions. Give your health care provider a list of all the medicines, herbs, non-prescription drugs, or dietary supplements you use. Also tell them if you smoke, drink alcohol, or use illegal drugs. Some items may interact with your medicine. What should I watch for while using this medicine? Tell your doctor or health care professional if your symptoms do not improve. You may get drowsy or dizzy. Do not drive, use machinery, or do anything that needs mental alertness until you know how this medicine affects you. Do not stand or sit up quickly, especially if you are an older patient. This reduces the risk of dizzy or fainting spells. Alcohol may interfere with the effect of this medicine. Avoid alcoholic drinks. Your mouth may get dry. Chewing sugarless gum or sucking hard candy, and drinking plenty of water may help. Contact your doctor if the problem does not go away or is severe. This medicine may cause dry eyes and blurred vision. If you wear contact lenses you may feel some discomfort. Lubricating drops may help. See your eye doctor if the problem does not go away or is severe. If you are receiving skin tests for allergies, tell your doctor you are using this medicine. What side effects may I notice from receiving this medicine? Side effects that you should report to your doctor or health care professional as soon as possible: -fast or irregular heartbeat -difficulty passing urine -seizures -slurred speech or confusion -tremor Side effects that  usually do not require medical attention (report to your doctor or health care professional if they continue or are bothersome): -constipation -drowsiness -fatigue -headache -stomach upset This list may not describe all possible side effects. Call your doctor for  medical advice about side effects. You may report side effects to FDA at 1-800-FDA-1088. Where should I keep my medicine? Keep out of the reach of children. Store at room temperature between 15 and 30 degrees C (59 and 86 degrees F). Keep container tightly closed. Throw away any unused medicine after the expiration date. NOTE: This sheet is a summary. It may not cover all possible information. If you have questions about this medicine, talk to your doctor, pharmacist, or health care provider.  2018 Elsevier/Gold Standard (2007-11-12 14:50:59)  

## 2017-11-16 ENCOUNTER — Ambulatory Visit: Payer: BLUE CROSS/BLUE SHIELD | Admitting: Psychiatry

## 2017-12-01 ENCOUNTER — Telehealth: Payer: Self-pay

## 2017-12-01 NOTE — Telephone Encounter (Signed)
pt states he is using [facility] now and needs rx [facility] for 90 day supply

## 2017-12-01 NOTE — Telephone Encounter (Signed)
Called let message to call our office back

## 2017-12-08 ENCOUNTER — Ambulatory Visit: Payer: BLUE CROSS/BLUE SHIELD | Admitting: Psychiatry

## 2017-12-10 ENCOUNTER — Ambulatory Visit: Payer: BLUE CROSS/BLUE SHIELD | Admitting: Psychiatry

## 2017-12-21 ENCOUNTER — Other Ambulatory Visit: Payer: Self-pay | Admitting: Family Medicine

## 2017-12-21 ENCOUNTER — Encounter: Payer: Self-pay | Admitting: Family Medicine

## 2017-12-21 ENCOUNTER — Ambulatory Visit (INDEPENDENT_AMBULATORY_CARE_PROVIDER_SITE_OTHER): Payer: BLUE CROSS/BLUE SHIELD | Admitting: Family Medicine

## 2017-12-21 VITALS — BP 136/88 | HR 71 | Temp 98.6°F | Ht 63.0 in | Wt 249.6 lb

## 2017-12-21 DIAGNOSIS — E559 Vitamin D deficiency, unspecified: Secondary | ICD-10-CM | POA: Diagnosis not present

## 2017-12-21 DIAGNOSIS — E039 Hypothyroidism, unspecified: Secondary | ICD-10-CM

## 2017-12-21 DIAGNOSIS — F329 Major depressive disorder, single episode, unspecified: Secondary | ICD-10-CM

## 2017-12-21 DIAGNOSIS — F32A Depression, unspecified: Secondary | ICD-10-CM

## 2017-12-21 DIAGNOSIS — Z0000 Encounter for general adult medical examination without abnormal findings: Secondary | ICD-10-CM | POA: Diagnosis not present

## 2017-12-21 DIAGNOSIS — F988 Other specified behavioral and emotional disorders with onset usually occurring in childhood and adolescence: Secondary | ICD-10-CM | POA: Insufficient documentation

## 2017-12-21 DIAGNOSIS — F909 Attention-deficit hyperactivity disorder, unspecified type: Secondary | ICD-10-CM | POA: Diagnosis not present

## 2017-12-21 MED ORDER — ARIPIPRAZOLE 10 MG PO TABS: 10.0000 mg | ORAL_TABLET | Freq: Every day | ORAL | 1 refills | Status: DC

## 2017-12-21 NOTE — Patient Instructions (Addendum)
Great to see you. I will call you with your lab results from today and you can view them online.   We are referring you to a new psychiatrist and to a therapist (Bambi)- stop by on your way out to discuss.  We are increasing your Abilify to 10 mg daily.

## 2017-12-21 NOTE — Assessment & Plan Note (Signed)
Continue current dose of synthroid.  Check labs today. 

## 2017-12-21 NOTE — Assessment & Plan Note (Signed)
Reviewed preventive care protocols, scheduled due services, and updated immunizations Discussed nutrition, exercise, diet, and healthy lifestyle.  Labs today. Cologuard ordered.  Orders Placed This Encounter  Procedures  . CBC with Differential/Platelet  . Comprehensive metabolic panel  . Lipid panel  . TSH  . Vitamin D (25 hydroxy)  . T4, free

## 2017-12-21 NOTE — Assessment & Plan Note (Addendum)
Deteriorated. PHQ of 23 today. Increase Abilify to 10 mg daily. Was Followed by psychiatry.  Needs referral to a new psychiatrist-- referral placed.

## 2017-12-21 NOTE — Progress Notes (Signed)
Subjective:   Patient ID: Dominique Nunez, female    DOB: [birth year], 52 y.o.   MRN: [id number]  Dominique Nunez is a pleasant 52 y.o. year old female who presents to clinic today with Annual Exam (Patient is here today for a CPE without PAP which she gets done a [facility].  She is scheduled for PAP and Mammogram in July.  She agrees to [employer] screening.) and Depression (Patient is also stating that she was told not to come back to see Dr. Shea Evans because she couldn't come for an appt due to vomiting with Flu.  She would like a referral. She is feeling that the Abilify worked well at first but needs to be increased to the next level but now she does not have that prescriber.  PSQ-9 given to pt.)  on 12/21/2017  HPI:  S/p laparoscopic salipingo ooprhectomy by Dr. Radene Knee in 06/2016 for pelvic pain. Remote h/o hysterectomy.  Health Maintenance  Topic Date Due  . PAP SMEAR  05/30/2017  . MAMMOGRAM  09/18/2017  . COLONOSCOPY  09/18/2017  . INFLUENZA VACCINE  02/11/2018  . TETANUS/TDAP  09/19/2026  . HIV Screening  Completed     Hypothyroidism- has been clinically euthyroid on synthroid 88 mcg daily.  Due for labs. Lab Results  Component Value Date   TSH 3.430 09/18/2016   Depression- was followed by [facility] health/psychiatry now, Dr. Shea Evans.  Last seen on 10/15/17.  Note reviewed. At that time, advised to continue Abilify, Wellbutrin and zoloft.  Added Vistaril as she was weaning down Klonopin.  Per pt, cannot follow up with her- was told she couldn't follow up for her appointment because she was vomiting.  She does feel Abilify worked well at first but needs to be increased.   Lab Results  Component Value Date   WBC 10.0 09/18/2016   HGB 12.5 09/18/2016   HCT 38.0 09/18/2016   MCV 88 09/18/2016   PLT 358 09/18/2016   Lab Results  Component Value Date   NA 140 09/18/2016   K 5.0 09/18/2016   CL 102 09/18/2016   CO2 24 09/18/2016   Lab Results  Component Value Date    CHOL 208 (H) 09/18/2016   HDL 61 09/18/2016   LDLCALC 114 (H) 09/18/2016   TRIG 163 (H) 09/18/2016   CHOLHDL 3.4 09/18/2016   Current Outpatient Medications on File Prior to Visit  Medication Sig Dispense Refill  . acetaminophen (TYLENOL) 650 MG CR tablet Take 1,300 mg by mouth 2 (two) times daily as needed for pain.    . ARIPiprazole (ABILIFY) 5 MG tablet TAKE 1 TABLET(5 MG) BY MOUTH AT BEDTIME 90 tablet 0  . buPROPion (WELLBUTRIN XL) 300 MG 24 hr tablet Take 1 tablet (300 mg total) by mouth daily. Needs appt for future fills 90 tablet 0  . cetirizine (ZYRTEC) 10 MG tablet Take 10 mg by mouth daily.    . clonazePAM (KLONOPIN) 0.5 MG tablet TAKE 1 TABLET BY MOUTH DAILY AS NEEDED FOR ANXIETY 30 tablet 0  . estradiol (VIVELLE-DOT) 0.05 MG/24HR patch Place 1 patch onto the skin once a week.    . hydrOXYzine (VISTARIL) 25 MG capsule Take 1 capsule (25 mg total) by mouth daily as needed (severe anxiety). 30 capsule 1  . ibuprofen (ADVIL,MOTRIN) 200 MG tablet Take 800 mg by mouth daily as needed for moderate pain.    Marland Kitchen levothyroxine (SYNTHROID, LEVOTHROID) 88 MCG tablet TAKE 1 TABLET BY MOUTH  DAILY 90 tablet 3  .  PROCTOZONE-HC 2.5 % rectal cream U REC BID  1  . rizatriptan (MAXALT) 10 MG tablet Take 1 tablet (10 mg total) by mouth as needed for migraine. May repeat in 2 hours if needed 10 tablet 0  . sertraline (ZOLOFT) 100 MG tablet TAKE 1 TABLET BY MOUTH  DAILY 90 tablet 1  . zolpidem (AMBIEN) 10 MG tablet Take 1 tablet (10 mg total) by mouth at bedtime as needed for sleep. 30 tablet 0   No current facility-administered medications on file prior to visit.     No Known Allergies  Past Medical History:  Diagnosis Date  . Anxiety   . Bipolar disorder ([place])   . Depression   . GERD (gastroesophageal reflux disease)   . Headache   . Heart murmur   . History of fainting spells of unknown cause   . Hypothyroidism   . Kidney stones   . Migraine   . PONV (postoperative nausea and  vomiting)   . Thyroid disease     Past Surgical History:  Procedure Laterality Date  . ABDOMINAL HYSTERECTOMY  2000  . BREAST BIOPSY  1984  . CHOLECYSTECTOMY  2000  . CYSTOSCOPY N/A 06/26/2016   Procedure: CYSTOSCOPY;  Surgeon: Arvella Nigh, MD;  Location: [facility];  Service: Gynecology;  Laterality: N/A;  . INNER EAR SURGERY    . LAPAROSCOPIC SALPINGO OOPHERECTOMY Bilateral 06/26/2016   Procedure: LAPAROSCOPIC SALPINGO OOPHORECTOMY,BILATERAL;  Surgeon: Arvella Nigh, MD;  Location: [facility];  Service: Gynecology;  Laterality: Bilateral;  . TONSILLECTOMY AND ADENOIDECTOMY  1976  . WISDOM TOOTH EXTRACTION      Family History  Problem Relation Age of Onset  . Cancer Mother   . Ovarian cancer Mother   . Asthma Mother   . COPD Mother   . Alcohol abuse Father   . Kidney disease Father   . Diabetes Father   . Bipolar disorder Father   . Anxiety disorder Father   . Depression Father   . Kidney disease Brother   . Hepatitis C Brother   . Alcohol abuse Brother   . Bipolar disorder Brother   . Anxiety disorder Brother   . Depression Brother   . Stroke Paternal Aunt   . Cancer Paternal Aunt   . Alcohol abuse Maternal Grandfather   . Alcohol abuse Paternal Grandfather   . Arthritis Paternal Grandfather   . Cancer Paternal Grandfather   . Heart disease Paternal Grandfather   . Stroke Paternal Grandfather     Social History   Socioeconomic History  . Marital status: Married    Spouse name: Photographer  . Number of children: 2  . Years of education: Not on file  . Highest education level: Bachelor's degree (e.g., BA, AB, BS)  Occupational History    Comment: full time  Social Needs  . Financial resource strain: Not hard at all  . Food insecurity:    Worry: Never true    Inability: Never true  . Transportation needs:    Medical: No    Non-medical: No  Tobacco Use  . Smoking status: Never Smoker  . Smokeless tobacco: Never Used  Substance and Sexual Activity  . Alcohol use: No    . Drug use: No  . Sexual activity: Yes    Birth control/protection: Surgical  Lifestyle  . Physical activity:    Days per week: 0 days    Minutes per session: 0 min  . Stress: Rather much  Relationships  . Social connections:    Talks  on phone: Once a week    Gets together: Never    Attends religious service: Never    Active member of club or organization: No    Attends meetings of clubs or organizations: Never    Relationship status: Married  . Intimate partner violence:    Fear of current or ex partner: No    Emotionally abused: No    Physically abused: No    Forced sexual activity: No  Other Topics Concern  . Not on file  Social History Narrative  . Not on file   The PMH, PSH, Social History, Family History, Medications, and allergies have been reviewed in [facility], and have been updated if relevant.    Review of Systems  Constitutional: Positive for fatigue.  HENT: Negative.   Respiratory: Negative.   Cardiovascular: Negative.   Gastrointestinal: Negative.   Endocrine: Negative.   Genitourinary: Negative.   Musculoskeletal: Negative for back pain.  Allergic/Immunologic: Negative.   Neurological: Negative.   Hematological: Negative.   Psychiatric/Behavioral: Negative for decreased concentration, self-injury, sleep disturbance and suicidal ideas.  All other systems reviewed and are negative.      Objective:    BP 136/88 (BP Location: Left Arm, Patient Position: Sitting, Cuff Size: Normal)   Pulse 71   Temp 98.6 F (37 C) (Oral)   Ht 5\' 3"  (1.6 m)   Wt 249 lb 9.6 oz (113.2 kg)   SpO2 98%   BMI 44.21 kg/m    Physical Exam   General:  Well-developed,well-nourished,in no acute distress; alert,appropriate and cooperative throughout examination Head:  normocephalic and atraumatic.   Eyes:  vision grossly intact, PERRL Ears:  R ear normal and L ear normal externally, TMs clear bilaterally Nose:  no external deformity.   Mouth:  good dentition.   Neck:  No  deformities, masses, or tenderness noted. Breasts:  No mass, nodules, thickening, tenderness, bulging, retraction, inflamation, nipple discharge or skin changes noted.   Lungs:  Normal respiratory effort, chest expands symmetrically. Lungs are clear to auscultation, no crackles or wheezes. Heart:  Normal rate and regular rhythm. S1 and S2 normal without gallop, murmur, click, rub or other extra sounds. Abdomen:  Bowel sounds positive,abdomen soft and non-tender without masses, organomegaly or hernias noted. Msk:  No deformity or scoliosis noted of thoracic or lumbar spine.   Extremities:  No clubbing, cyanosis, edema, or deformity noted with normal full range of motion of all joints.   Neurologic:  alert & oriented X3 and gait normal.   Skin:  Intact without suspicious lesions or rashes Cervical Nodes:  No lymphadenopathy noted Axillary Nodes:  No palpable lymphadenopathy Psych:  Cognition and judgment appear intact. Alert and cooperative with normal attention span and concentration. No apparent delusions, illusions, hallucinations      Assessment & Plan:   Well woman exam without gynecological exam  Depression, unspecified depression type  Hypothyroidism, unspecified type - Plan: CBC with Differential/Platelet, Comprehensive metabolic panel, Lipid panel, TSH, T4, free  Vitamin D deficiency - Plan: Vitamin D (25 hydroxy) No follow-ups on file.

## 2017-12-21 NOTE — Addendum Note (Signed)
Addended by: Diona Foley on: 12/21/2017 12:43 PM   Modules accepted: Orders

## 2017-12-21 NOTE — Assessment & Plan Note (Signed)
Check Vit D today. 

## 2017-12-22 ENCOUNTER — Ambulatory Visit: Payer: Self-pay | Admitting: *Deleted

## 2017-12-22 ENCOUNTER — Other Ambulatory Visit: Payer: Self-pay | Admitting: Family Medicine

## 2017-12-22 DIAGNOSIS — E559 Vitamin D deficiency, unspecified: Secondary | ICD-10-CM

## 2017-12-22 LAB — CBC WITH DIFFERENTIAL/PLATELET
BASOS ABS: 0.1 10*3/uL (ref 0.0–0.2)
Basos: 0 %
EOS (ABSOLUTE): 0.3 10*3/uL (ref 0.0–0.4)
Eos: 3 %
Hematocrit: 37 % (ref 34.0–46.6)
Hemoglobin: 12.6 g/dL (ref 11.1–15.9)
Immature Grans (Abs): 0 10*3/uL (ref 0.0–0.1)
Immature Granulocytes: 0 %
LYMPHS: 28 %
Lymphocytes Absolute: 3.2 10*3/uL — ABNORMAL HIGH (ref 0.7–3.1)
MCH: 28.6 pg (ref 26.6–33.0)
MCHC: 34.1 g/dL (ref 31.5–35.7)
MCV: 84 fL (ref 79–97)
Monocytes Absolute: 0.7 10*3/uL (ref 0.1–0.9)
Monocytes: 6 %
NEUTROS ABS: 7 10*3/uL (ref 1.4–7.0)
NEUTROS PCT: 63 %
PLATELETS: 332 10*3/uL (ref 150–450)
RBC: 4.4 x10E6/uL (ref 3.77–5.28)
RDW: 14 % (ref 12.3–15.4)
WBC: 11.3 10*3/uL — AB (ref 3.4–10.8)

## 2017-12-22 LAB — COMPREHENSIVE METABOLIC PANEL
ALK PHOS: 95 IU/L (ref 39–117)
ALT: 16 IU/L (ref 0–32)
AST: 11 IU/L (ref 0–40)
Albumin/Globulin Ratio: 1.7 (ref 1.2–2.2)
Albumin: 4.1 g/dL (ref 3.5–5.5)
BILIRUBIN TOTAL: 0.2 mg/dL (ref 0.0–1.2)
BUN/Creatinine Ratio: 17 (ref 9–23)
BUN: 13 mg/dL (ref 6–24)
CO2: 21 mmol/L (ref 20–29)
Calcium: 9.6 mg/dL (ref 8.7–10.2)
Chloride: 102 mmol/L (ref 96–106)
Creatinine, Ser: 0.76 mg/dL (ref 0.57–1.00)
GFR calc Af Amer: 106 mL/min/{1.73_m2} (ref >59)
GFR calc non Af Amer: 92 mL/min/{1.73_m2} (ref >59)
GLUCOSE: 91 mg/dL (ref 65–99)
Globulin, Total: 2.4 g/dL (ref 1.5–4.5)
Potassium: 4.3 mmol/L (ref 3.5–5.2)
Sodium: 138 mmol/L (ref 134–144)
TOTAL PROTEIN: 6.5 g/dL (ref 6.0–8.5)

## 2017-12-22 LAB — T4, FREE: Free T4: 1.27 ng/dL (ref 0.82–1.77)

## 2017-12-22 LAB — TSH: TSH: 2.87 u[IU]/mL (ref 0.450–4.500)

## 2017-12-22 LAB — VITAMIN D 25 HYDROXY (VIT D DEFICIENCY, FRACTURES): Vit D, 25-Hydroxy: 23.4 ng/mL — ABNORMAL LOW (ref 30.0–100.0)

## 2017-12-22 LAB — LIPID PANEL
CHOLESTEROL TOTAL: 215 mg/dL — AB (ref 100–199)
Chol/HDL Ratio: 3.5 ratio (ref 0.0–4.4)
HDL: 61 mg/dL (ref >39)
LDL Calculated: 100 mg/dL — ABNORMAL HIGH (ref 0–99)
Triglycerides: 269 mg/dL — ABNORMAL HIGH (ref 0–149)
VLDL CHOLESTEROL CAL: 54 mg/dL — AB (ref 5–40)

## 2017-12-22 MED ORDER — VITAMIN D3 1.25 MG (50000 UT) PO TABS: 50000.0000 [IU] | ORAL_TABLET | ORAL | 0 refills | Status: DC

## 2017-12-22 NOTE — Telephone Encounter (Signed)
Charted in result notes. 

## 2017-12-23 ENCOUNTER — Other Ambulatory Visit: Payer: Self-pay | Admitting: Family Medicine

## 2017-12-24 ENCOUNTER — Other Ambulatory Visit: Payer: Self-pay | Admitting: Family Medicine

## 2017-12-30 DIAGNOSIS — Z01419 Encounter for gynecological examination (general) (routine) without abnormal findings: Secondary | ICD-10-CM | POA: Diagnosis not present

## 2017-12-30 DIAGNOSIS — Z6841 Body Mass Index (BMI) 40.0 and over, adult: Secondary | ICD-10-CM | POA: Diagnosis not present

## 2017-12-30 DIAGNOSIS — Z1231 Encounter for screening mammogram for malignant neoplasm of breast: Secondary | ICD-10-CM | POA: Diagnosis not present

## 2018-01-05 ENCOUNTER — Other Ambulatory Visit: Payer: Self-pay | Admitting: Obstetrics and Gynecology

## 2018-01-05 DIAGNOSIS — M170 Bilateral primary osteoarthritis of knee: Secondary | ICD-10-CM | POA: Diagnosis not present

## 2018-01-05 DIAGNOSIS — Z803 Family history of malignant neoplasm of breast: Secondary | ICD-10-CM

## 2018-01-08 ENCOUNTER — Ambulatory Visit: Payer: BLUE CROSS/BLUE SHIELD | Admitting: Psychology

## 2018-01-13 ENCOUNTER — Other Ambulatory Visit: Payer: Self-pay | Admitting: Psychiatry

## 2018-01-13 DIAGNOSIS — F331 Major depressive disorder, recurrent, moderate: Secondary | ICD-10-CM

## 2018-01-21 ENCOUNTER — Encounter: Payer: Self-pay | Admitting: Family Medicine

## 2018-01-24 ENCOUNTER — Other Ambulatory Visit: Payer: Self-pay

## 2018-02-21 ENCOUNTER — Other Ambulatory Visit: Payer: Self-pay | Admitting: Family Medicine

## 2018-02-22 ENCOUNTER — Telehealth: Payer: Self-pay

## 2018-02-22 NOTE — Telephone Encounter (Signed)
PEC-I called and LMOVM asking if she was scheduling an appointment with the therapist Bambi Cottle/If she needs help finding a therapist then we need to know that as well/thx dmf

## 2018-02-22 NOTE — Telephone Encounter (Signed)
PEC-I LMOVM asking pt to RTC to advise if they completed a Cologuard kit from [employer] since I sent the information to them on 6.10.19/thx dmf

## 2018-02-23 NOTE — Telephone Encounter (Signed)
Patient called back and stated that she did receive the Cologuard kit but have not ha a chance to complete it as of yet.  Daughter has been sick and she has not had time.  Patient states she will get to it as soon as possible.

## 2018-02-24 NOTE — Telephone Encounter (Signed)
Thank you/dmf 

## 2018-03-05 ENCOUNTER — Other Ambulatory Visit: Payer: Self-pay

## 2018-03-05 ENCOUNTER — Telehealth: Payer: Self-pay

## 2018-03-05 NOTE — Telephone Encounter (Signed)
TA-It looks like the appointment on 4.4.90 with Ursula Alert, MD she was Dx with MDD; ADHD, GAD/plz advise/thx dmf

## 2018-03-05 NOTE — Telephone Encounter (Signed)
TA-She states that she discussed this with you about Dx of ADD in March 2019 and if she felt the need to try something if things got any worse to let you know/she feels that she is needing to start something and would try whatever you suggest/she states that she was just seen/she has never tried anything for this Dx but would like to try something and then possibly follow-up after taking for a bit to discuss efficacy/plz advise/thx dmf

## 2018-03-05 NOTE — Telephone Encounter (Signed)
Has she followed up with psychiatry since our last appointment?  Given the medications she is currently taking, I would like their input in terms of which stimulant to start.

## 2018-03-05 NOTE — Telephone Encounter (Signed)
Copied from [place] [phone]. Topic: General - Other >> Mar 05, 2018 11:22 AM Yvette Rack wrote: Reason for CRM: Pt states she needs to speak with Dr Deborra Medina or her nurse in regards to some ADD medications. Pt requests call back. Cb# [phone] or  [phone]

## 2018-03-05 NOTE — Telephone Encounter (Signed)
We referred her to another psychiatrist.  Has she seen he or she yet? Given the other medications she is taking, we would need her psychiatrist to help manage this.  Has she ever been formally diagnosed for ADD by a psychiatrist or a psychologist?

## 2018-03-09 NOTE — Telephone Encounter (Signed)
PEC-LMOVM for pt to RTC to advise if she has seen the Psychiatrist since 4.4.19/and if not to advise as to why or what so that we can get that information to Dr. Aron/thx dmf

## 2018-03-18 DIAGNOSIS — Z13820 Encounter for screening for osteoporosis: Secondary | ICD-10-CM | POA: Diagnosis not present

## 2018-03-18 DIAGNOSIS — Z809 Family history of malignant neoplasm, unspecified: Secondary | ICD-10-CM | POA: Diagnosis not present

## 2018-03-24 ENCOUNTER — Other Ambulatory Visit: Payer: Self-pay | Admitting: Family Medicine

## 2018-03-26 ENCOUNTER — Encounter: Payer: Self-pay | Admitting: Licensed Clinical Social Worker

## 2018-04-14 ENCOUNTER — Telehealth: Payer: Self-pay

## 2018-04-14 NOTE — Telephone Encounter (Signed)
Copied from [place] [phone]. Topic: General - Other >> Apr 14, 2018 10:44 AM Leward Quan A wrote: Reason for CRM:  Patient called to say that she faxed over a BMI paper work to be signed by Dr Deborra Medina would like a call back upon receipt so she can know if it should be faxed again. Ph# [phone]

## 2018-04-14 NOTE — Telephone Encounter (Signed)
LMOVM that form is awaiting TA's signature and I will fax in the am/thx dmf

## 2018-04-25 ENCOUNTER — Other Ambulatory Visit: Payer: Self-pay | Admitting: Family Medicine

## 2018-04-27 ENCOUNTER — Other Ambulatory Visit: Payer: Self-pay | Admitting: Family Medicine

## 2018-05-07 ENCOUNTER — Other Ambulatory Visit: Payer: Self-pay | Admitting: Family Medicine

## 2018-05-29 ENCOUNTER — Other Ambulatory Visit: Payer: Self-pay | Admitting: Family Medicine

## 2018-07-19 ENCOUNTER — Other Ambulatory Visit: Payer: Self-pay | Admitting: Family Medicine

## 2018-07-20 DIAGNOSIS — M170 Bilateral primary osteoarthritis of knee: Secondary | ICD-10-CM | POA: Diagnosis not present

## 2018-07-20 DIAGNOSIS — S83241A Other tear of medial meniscus, current injury, right knee, initial encounter: Secondary | ICD-10-CM | POA: Diagnosis not present

## 2018-07-24 DIAGNOSIS — M25561 Pain in right knee: Secondary | ICD-10-CM | POA: Diagnosis not present

## 2018-08-04 DIAGNOSIS — M170 Bilateral primary osteoarthritis of knee: Secondary | ICD-10-CM | POA: Diagnosis not present

## 2018-08-05 DIAGNOSIS — H168 Other keratitis: Secondary | ICD-10-CM | POA: Diagnosis not present

## 2018-08-12 DIAGNOSIS — M170 Bilateral primary osteoarthritis of knee: Secondary | ICD-10-CM | POA: Diagnosis not present

## 2018-08-19 DIAGNOSIS — M170 Bilateral primary osteoarthritis of knee: Secondary | ICD-10-CM | POA: Diagnosis not present

## 2018-08-19 DIAGNOSIS — Q825 Congenital non-neoplastic nevus: Secondary | ICD-10-CM | POA: Diagnosis not present

## 2018-08-19 DIAGNOSIS — Z08 Encounter for follow-up examination after completed treatment for malignant neoplasm: Secondary | ICD-10-CM | POA: Diagnosis not present

## 2018-08-19 DIAGNOSIS — Z85828 Personal history of other malignant neoplasm of skin: Secondary | ICD-10-CM | POA: Diagnosis not present

## 2018-08-19 DIAGNOSIS — D2372 Other benign neoplasm of skin of left lower limb, including hip: Secondary | ICD-10-CM | POA: Diagnosis not present

## 2018-08-20 ENCOUNTER — Other Ambulatory Visit: Payer: Self-pay | Admitting: Family Medicine

## 2018-08-24 DIAGNOSIS — J209 Acute bronchitis, unspecified: Secondary | ICD-10-CM | POA: Diagnosis not present

## 2018-08-31 ENCOUNTER — Ambulatory Visit: Payer: Self-pay | Admitting: Family Medicine

## 2018-08-31 DIAGNOSIS — R05 Cough: Secondary | ICD-10-CM | POA: Diagnosis not present

## 2018-08-31 DIAGNOSIS — R509 Fever, unspecified: Secondary | ICD-10-CM | POA: Diagnosis not present

## 2018-08-31 DIAGNOSIS — J069 Acute upper respiratory infection, unspecified: Secondary | ICD-10-CM | POA: Diagnosis not present

## 2018-10-12 ENCOUNTER — Other Ambulatory Visit: Payer: Self-pay | Admitting: Family Medicine

## 2018-10-20 ENCOUNTER — Ambulatory Visit: Payer: BLUE CROSS/BLUE SHIELD | Admitting: Psychology

## 2018-10-20 ENCOUNTER — Ambulatory Visit: Payer: Self-pay | Admitting: Family Medicine

## 2018-10-30 ENCOUNTER — Other Ambulatory Visit: Payer: Self-pay | Admitting: Family Medicine

## 2018-11-02 ENCOUNTER — Telehealth: Payer: Self-pay

## 2018-11-02 NOTE — Telephone Encounter (Signed)
LMOVM for pt to call back to get on the schedule with Dr. Deborra Medina for virtual visit/refill request received for Levothyroxine but it has been almost a year since seen and pt never did have Vit-Jamiaya Bina rechecked/thx dmf

## 2018-11-02 NOTE — Telephone Encounter (Signed)
LMOVM for pt to RTC and get on schedule for virtual visit with Dr. Iver Nestle await RTC before will send in Rx/last filled 90d on 2.23.20 so she still has plenty/thx dmf

## 2018-11-04 ENCOUNTER — Ambulatory Visit (INDEPENDENT_AMBULATORY_CARE_PROVIDER_SITE_OTHER): Payer: BLUE CROSS/BLUE SHIELD | Admitting: Family Medicine

## 2018-11-04 DIAGNOSIS — E039 Hypothyroidism, unspecified: Secondary | ICD-10-CM

## 2018-11-04 DIAGNOSIS — E559 Vitamin D deficiency, unspecified: Secondary | ICD-10-CM

## 2018-11-04 DIAGNOSIS — F329 Major depressive disorder, single episode, unspecified: Secondary | ICD-10-CM

## 2018-11-04 DIAGNOSIS — F32A Depression, unspecified: Secondary | ICD-10-CM

## 2018-11-04 MED ORDER — LEVOTHYROXINE SODIUM 88 MCG PO TABS: 88.0000 ug | ORAL_TABLET | Freq: Every day | ORAL | 3 refills | Status: DC

## 2018-11-04 NOTE — Assessment & Plan Note (Signed)
Needs refills- clinically euthyroid.  She will call to schedule an in office follow up/CPX with labs in 12/2018. eRx refills sent. The patient indicates understanding of these issues and agrees with the plan.

## 2018-11-04 NOTE — Progress Notes (Signed)
TELEPHONE ENCOUNTER   Patient verbally agreed to telephone visit and is aware that copayment and coinsurance may apply. Patient was treated using telemedicine according to accepted telemedicine protocols.  Location of the patient: home Location of provider: physician's home Names of all persons participating in the telemedicine service and role in the encounter: Arnette Norris, MD Velora Heckler  Subjective:   Chief Complaint  Patient presents with  . Follow-up     HPI  Hypothyroidism- has been clinically euthyroid on synthroid 88 mcg daily.  Denies any symptoms of hypo or hyperthyroidism.  Lab Results  Component Value Date   TSH 2.870 12/21/2017    Depression- was followed by [facility] health/psychiatry now, Dr. Shea Evans.  Last seen on 10/15/17.  Note reviewed.  At that time, advised to continue Abilify, Wellbutrin and zoloft.  Added Vistaril as she was weaning down Klonopin.  Per pt, cannot follow up with her- was told she couldn't follow up for her appointment because she was vomiting.  Has follow up scheduled next month with Dr. Clovis Pu. Patient Active Problem List   Diagnosis Date Noted  . Attention deficit disorder (ADD) 12/21/2017  . CRP elevated 06/16/2016  . 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 Edema 03/04/2016  . Vitamin D deficiency 02/14/2015  . Hypothyroidism 12/26/2014  . Depression    Social History   Tobacco Use  . Smoking status: Never Smoker  . Smokeless tobacco: Never Used  Substance Use Topics  . Alcohol use: No    Current Outpatient Medications:  .  acetaminophen (TYLENOL) 650 MG CR tablet, Take 1,300 mg by mouth 2 (two) times daily as needed for pain., Disp: , Rfl:  .  ARIPiprazole (ABILIFY) 10 MG tablet, TAKE 1 TABLET BY MOUTH DAILY, Disp: 90 tablet, Rfl: 0 .  buPROPion (WELLBUTRIN XL) 300 MG 24 hr tablet, TAKE 1 TABLET BY MOUTH  DAILY, Disp: 90 tablet, Rfl: 0 .  cetirizine (ZYRTEC) 10 MG tablet, Take 10 mg by mouth daily., Disp: , Rfl:   .  Cholecalciferol (VITAMIN D3) 50000 units TABS, Take 50,000 Units by mouth once a week. 1 tab po weekly for 6 weeks. Then continue Vit D 1600 IU daily, Disp: 6 tablet, Rfl: 0 .  clonazePAM (KLONOPIN) 0.5 MG tablet, TAKE 1 TABLET BY MOUTH DAILY AS NEEDED FOR ANXIETY, Disp: 30 tablet, Rfl: 0 .  estradiol (VIVELLE-DOT) 0.05 MG/24HR patch, Place 1 patch onto the skin once a week., Disp: , Rfl:  .  hydrOXYzine (VISTARIL) 25 MG capsule, Take 1 capsule (25 mg total) by mouth daily as needed (severe anxiety)., Disp: 30 capsule, Rfl: 1 .  ibuprofen (ADVIL,MOTRIN) 200 MG tablet, Take 800 mg by mouth daily as needed for moderate pain., Disp: , Rfl:  .  levothyroxine (SYNTHROID, LEVOTHROID) 88 MCG tablet, TAKE 1 TABLET BY MOUTH  DAILY, Disp: 90 tablet, Rfl: 3 .  PROCTOZONE-HC 2.5 % rectal cream, U REC BID, Disp: , Rfl: 1 .  rizatriptan (MAXALT) 10 MG tablet, Take 1 tablet (10 mg total) by mouth as needed for migraine. May repeat in 2 hours if needed, Disp: 10 tablet, Rfl: 0 .  sertraline (ZOLOFT) 100 MG tablet, TAKE 1 TABLET BY MOUTH  DAILY, Disp: 90 tablet, Rfl: 1 .  zolpidem (AMBIEN) 10 MG tablet, Take 1 tablet (10 mg total) by mouth at bedtime as needed for sleep., Disp: 30 tablet, Rfl: 0 No Known Allergies  Assessment & Plan:   1. Hypothyroidism, unspecified type   2. Vitamin D deficiency  3. Depression, unspecified depression type     No orders of the defined types were placed in this encounter.  No orders of the defined types were placed in this encounter.   Arnette Norris, MD 11/04/2018  Time spent with the patient:  5 minutes, spent in obtaining information about her symptoms, reviewing her previous labs, evaluations, and treatments, counseling her about her condition (please see the discussed topics above), and developing a plan to further investigate it; she had a number of questions which I addressed.   [id number] physician/qualified health professional telephone evaluation 5 to 10 minutes  [id number] physician/qualified help functional [place] evaluation for 11 to 20 minutes [id number] physician/qualify he will professional telephone evaluation for 21 to 30 minutes

## 2018-11-04 NOTE — Telephone Encounter (Signed)
Pt needs to schedule OV w/Labs for TSH levels. Rx refill for 30 day supply.

## 2018-11-04 NOTE — Assessment & Plan Note (Signed)
Followed by Dr. Clovis Pu.  Has appt coming up and she feels she is doing well.

## 2018-11-15 ENCOUNTER — Other Ambulatory Visit: Payer: Self-pay | Admitting: Family Medicine

## 2018-11-23 ENCOUNTER — Telehealth: Payer: Self-pay

## 2018-11-23 ENCOUNTER — Other Ambulatory Visit: Payer: Self-pay

## 2018-11-23 MED ORDER — AMPHETAMINE-DEXTROAMPHET ER 10 MG PO CP24: 10.0000 mg | ORAL_CAPSULE | Freq: Every day | ORAL | 0 refills | Status: DC

## 2018-11-23 NOTE — Telephone Encounter (Signed)
Noted.  Will send rx to pharmacy on file.

## 2018-11-23 NOTE — Telephone Encounter (Signed)
TA-Pt states that she has talked to you at length about the benefits of adding a low-dosage of Adderall/She would like to give that a try/she would like to have this sent to [facility] on file/she had a recent phone visit/she would like to try this and then schedule a follow-up to discuss how she is benefiting from this and if adjustments should be made/thx dmf

## 2018-11-23 NOTE — Addendum Note (Signed)
Addended by: Lucille Passy on: 11/23/2018 02:44 PM   Modules accepted: Orders

## 2018-11-23 NOTE — Telephone Encounter (Signed)
LMOVM for pt to either call the office back or to send a MyChart message with details about the medication she would like to discuss (name of med, what questions she has in regards to that medication)thx dmf

## 2018-11-23 NOTE — Telephone Encounter (Signed)
Copied from [place] [phone]. Topic: General - Inquiry >> Nov 23, 2018 10:15 AM Richardo Priest, NT wrote: Patient is calling in stating she would like a call back from [place] in regards to a medication she would like to discuss with her. Patient's call back is [phone].

## 2018-11-29 ENCOUNTER — Other Ambulatory Visit: Payer: Self-pay | Admitting: Family Medicine

## 2019-01-05 ENCOUNTER — Other Ambulatory Visit: Payer: Self-pay | Admitting: Family Medicine

## 2019-01-13 ENCOUNTER — Telehealth: Payer: Self-pay | Admitting: Family Medicine

## 2019-01-13 MED ORDER — AMPHETAMINE-DEXTROAMPHET ER 10 MG PO CP24: 10.0000 mg | ORAL_CAPSULE | Freq: Every day | ORAL | 0 refills | Status: DC

## 2019-01-13 NOTE — Telephone Encounter (Signed)
Medication Refill - Medication: amphetamine-dextroamphetamine (ADDERALL XR) 10 MG 24 hr capsule   Has the patient contacted their pharmacy? No. (Agent: If no, request that the patient contact the pharmacy for the refill.) (Agent: If yes, when and what did the pharmacy advise?)  Preferred Pharmacy (with phone number or street name):  [id number], [place] [phone] (Phone) [phone] (Fax)    Agent: Please be advised that RX refills may take up to 3 business days. We ask that you follow-up with your pharmacy.

## 2019-02-01 DIAGNOSIS — Z6841 Body Mass Index (BMI) 40.0 and over, adult: Secondary | ICD-10-CM | POA: Diagnosis not present

## 2019-02-01 DIAGNOSIS — Z01419 Encounter for gynecological examination (general) (routine) without abnormal findings: Secondary | ICD-10-CM | POA: Diagnosis not present

## 2019-02-01 DIAGNOSIS — Z1231 Encounter for screening mammogram for malignant neoplasm of breast: Secondary | ICD-10-CM | POA: Diagnosis not present

## 2019-02-02 ENCOUNTER — Other Ambulatory Visit: Payer: Self-pay | Admitting: Obstetrics and Gynecology

## 2019-02-02 DIAGNOSIS — N6310 Unspecified lump in the right breast, unspecified quadrant: Secondary | ICD-10-CM

## 2019-02-08 ENCOUNTER — Telehealth: Payer: Self-pay | Admitting: Family Medicine

## 2019-02-08 ENCOUNTER — Other Ambulatory Visit: Payer: Self-pay

## 2019-02-08 DIAGNOSIS — E785 Hyperlipidemia, unspecified: Secondary | ICD-10-CM

## 2019-02-08 DIAGNOSIS — R7982 Elevated C-reactive protein (CRP): Secondary | ICD-10-CM

## 2019-02-08 DIAGNOSIS — E559 Vitamin D deficiency, unspecified: Secondary | ICD-10-CM

## 2019-02-08 DIAGNOSIS — Z0000 Encounter for general adult medical examination without abnormal findings: Secondary | ICD-10-CM

## 2019-02-08 DIAGNOSIS — E039 Hypothyroidism, unspecified: Secondary | ICD-10-CM

## 2019-02-08 DIAGNOSIS — F329 Major depressive disorder, single episode, unspecified: Secondary | ICD-10-CM

## 2019-02-08 DIAGNOSIS — F32A Depression, unspecified: Secondary | ICD-10-CM

## 2019-02-08 NOTE — Telephone Encounter (Signed)
Plz see encounter with future orders/scheduled lab visit and CPE/LMOVM stating this and that if these dates and times don't work then we would be happy to change them to suit her/thx dmf

## 2019-02-08 NOTE — Progress Notes (Signed)
Per TA future orders entered for lab visit on 8.10.20 (to No Name) and CPE without PAP (uses [facility]) on 8.17.20/I LMOVM for pt advising that I took the liberty of scheduling her labs and CPE for her and advised of the dates and times and that if any of these need to be changed then we would be happy to/thx dmf

## 2019-02-08 NOTE — Telephone Encounter (Signed)
Patient calling to find out if she needs to have labwork done. Patient had telephone visit on 10/2018 with  Dr. Deborra Medina, however there is no orders for labwork. Please advise if patient needs to schedule an appointment or just come in for labwork. Please call patient at [phone].

## 2019-02-09 ENCOUNTER — Ambulatory Visit
Admission: RE | Admit: 2019-02-09 | Discharge: 2019-02-09 | Disposition: A | Discharge status: Home / Self Care | Payer: BLUE CROSS/BLUE SHIELD | Source: Ambulatory Visit | Attending: Obstetrics and Gynecology | Admitting: Obstetrics and Gynecology

## 2019-02-09 ENCOUNTER — Other Ambulatory Visit: Payer: Self-pay

## 2019-02-09 DIAGNOSIS — N6489 Other specified disorders of breast: Secondary | ICD-10-CM | POA: Diagnosis not present

## 2019-02-09 DIAGNOSIS — R922 Inconclusive mammogram: Secondary | ICD-10-CM | POA: Diagnosis not present

## 2019-02-09 DIAGNOSIS — N6310 Unspecified lump in the right breast, unspecified quadrant: Secondary | ICD-10-CM

## 2019-02-21 ENCOUNTER — Other Ambulatory Visit (INDEPENDENT_AMBULATORY_CARE_PROVIDER_SITE_OTHER): Payer: BC Managed Care – PPO

## 2019-02-21 ENCOUNTER — Other Ambulatory Visit: Payer: Self-pay | Admitting: Obstetrics and Gynecology

## 2019-02-21 ENCOUNTER — Other Ambulatory Visit: Payer: Self-pay

## 2019-02-21 DIAGNOSIS — F329 Major depressive disorder, single episode, unspecified: Secondary | ICD-10-CM

## 2019-02-21 DIAGNOSIS — E559 Vitamin D deficiency, unspecified: Secondary | ICD-10-CM | POA: Diagnosis not present

## 2019-02-21 DIAGNOSIS — Z9189 Other specified personal risk factors, not elsewhere classified: Secondary | ICD-10-CM

## 2019-02-21 DIAGNOSIS — E785 Hyperlipidemia, unspecified: Secondary | ICD-10-CM

## 2019-02-21 DIAGNOSIS — Z0000 Encounter for general adult medical examination without abnormal findings: Secondary | ICD-10-CM

## 2019-02-21 DIAGNOSIS — E039 Hypothyroidism, unspecified: Secondary | ICD-10-CM

## 2019-02-21 DIAGNOSIS — R739 Hyperglycemia, unspecified: Secondary | ICD-10-CM | POA: Diagnosis not present

## 2019-02-21 DIAGNOSIS — R7982 Elevated C-reactive protein (CRP): Secondary | ICD-10-CM | POA: Diagnosis not present

## 2019-02-21 DIAGNOSIS — F32A Depression, unspecified: Secondary | ICD-10-CM

## 2019-02-22 LAB — COMPREHENSIVE METABOLIC PANEL
ALT: 15 IU/L (ref 0–32)
AST: 14 IU/L (ref 0–40)
Albumin/Globulin Ratio: 1.7 (ref 1.2–2.2)
Albumin: 4.1 g/dL (ref 3.8–4.9)
Alkaline Phosphatase: 92 IU/L (ref 39–117)
BUN/Creatinine Ratio: 19 (ref 9–23)
BUN: 15 mg/dL (ref 6–24)
Bilirubin Total: 0.3 mg/dL (ref 0.0–1.2)
CO2: 25 mmol/L (ref 20–29)
Calcium: 10 mg/dL (ref 8.7–10.2)
Chloride: 106 mmol/L (ref 96–106)
Creatinine, Ser: 0.77 mg/dL (ref 0.57–1.00)
GFR calc Af Amer: 103 mL/min/{1.73_m2} (ref >59)
GFR calc non Af Amer: 90 mL/min/{1.73_m2} (ref >59)
Globulin, Total: 2.4 g/dL (ref 1.5–4.5)
Glucose: 125 mg/dL — ABNORMAL HIGH (ref 65–99)
Potassium: 5 mmol/L (ref 3.5–5.2)
Sodium: 145 mmol/L — ABNORMAL HIGH (ref 134–144)
Total Protein: 6.5 g/dL (ref 6.0–8.5)

## 2019-02-22 LAB — CBC WITH DIFFERENTIAL/PLATELET
Basophils Absolute: 0.1 10*3/uL (ref 0.0–0.2)
Basos: 1 %
EOS (ABSOLUTE): 0.3 10*3/uL (ref 0.0–0.4)
Eos: 3 %
Hematocrit: 41.1 % (ref 34.0–46.6)
Hemoglobin: 13.6 g/dL (ref 11.1–15.9)
Immature Grans (Abs): 0 10*3/uL (ref 0.0–0.1)
Immature Granulocytes: 0 %
Lymphocytes Absolute: 3.2 10*3/uL — ABNORMAL HIGH (ref 0.7–3.1)
Lymphs: 33 %
MCH: 28.8 pg (ref 26.6–33.0)
MCHC: 33.1 g/dL (ref 31.5–35.7)
MCV: 87 fL (ref 79–97)
Monocytes Absolute: 0.5 10*3/uL (ref 0.1–0.9)
Monocytes: 5 %
Neutrophils Absolute: 5.7 10*3/uL (ref 1.4–7.0)
Neutrophils: 58 %
Platelets: 336 10*3/uL (ref 150–450)
RBC: 4.73 x10E6/uL (ref 3.77–5.28)
RDW: 13.4 % (ref 11.7–15.4)
WBC: 9.8 10*3/uL (ref 3.4–10.8)

## 2019-02-22 LAB — T3: T3, Total: 104 ng/dL (ref 71–180)

## 2019-02-22 LAB — LIPID PANEL
Chol/HDL Ratio: 3.6 ratio (ref 0.0–4.4)
Cholesterol, Total: 240 mg/dL — ABNORMAL HIGH (ref 100–199)
HDL: 66 mg/dL (ref >39)
LDL Calculated: 137 mg/dL — ABNORMAL HIGH (ref 0–99)
Triglycerides: 185 mg/dL — ABNORMAL HIGH (ref 0–149)
VLDL Cholesterol Cal: 37 mg/dL (ref 5–40)

## 2019-02-22 LAB — VITAMIN D 25 HYDROXY (VIT D DEFICIENCY, FRACTURES): Vit D, 25-Hydroxy: 31.3 ng/mL (ref 30.0–100.0)

## 2019-02-22 LAB — TSH: TSH: 3.01 u[IU]/mL (ref 0.450–4.500)

## 2019-02-22 LAB — HIGH SENSITIVITY CRP: CRP, High Sensitivity: 17.3 mg/L — ABNORMAL HIGH (ref 0.00–3.00)

## 2019-02-22 LAB — T4, FREE: Free T4: 1.38 ng/dL (ref 0.82–1.77)

## 2019-02-27 NOTE — Progress Notes (Signed)
Subjective:   Patient ID: Dominique Nunez, female    DOB: 1966-03-26, 53 y.o.   MRN: [id number]  Dominique Nunez is a pleasant 53 y.o. year old female who presents to clinic today with Annual Exam (Pt screened at vehicle. She is here today for a CPE without PAP. She had fasting labs on 8.10.20. She is going to be getting her Colonoscopy completed by Dr. Radene Knee.)  on 02/28/2019  HPI:  S/p laparoscopic salipingo ooprhectomy by Dr. Radene Knee in 06/2016 for pelvic pain. Remote h/o hysterectomy. cologuard through Dr. Radene Knee. Mammogram 02/09/19.  Health Maintenance  Topic Date Due  . COLONOSCOPY  09/18/2017  . INFLUENZA VACCINE  03/07/2019 (Originally 02/12/2019)  . PAP SMEAR-Modifier  09/03/2019  . MAMMOGRAM  02/08/2021  . TETANUS/TDAP  09/19/2026  . HIV Screening  Completed   Hyperglycemia- fasting glucose 125- checked a1c- Lab Results  Component Value Date   HGBA1C 5.8 (H) 02/21/2019     Hypothyroidism- has been clinically euthyroid on synthroid 88 mcg daily. Lab Results  Component Value Date   TSH 3.010 02/21/2019   Depression- was followed by [facility] health/psychiatry now, Dr. Shea Evans.  Last seen on 10/15/17.  Note reviewed. At that time, advised to continue Abilify, Wellbutrin and zoloft.  Vistaril was added to Klonipin. Her depression and anxiety have worsened- partially due to covid but she also thinks that adderall isn't working as well as it was.  ADD- formally diagnosed with psych evaluation.  Does take adderall 10 mg daily (initally felt it was very beneficial) but feels she may need to increase dose. Depression screen [facility] 2/9 02/28/2019 12/21/2017 09/18/2016  Decreased Interest 0 3 1  Down, Depressed, Hopeless 3 3 1   PHQ - 2 Score 3 6 2   Altered sleeping 0 3 1  Tired, decreased energy 3 3 1   Change in appetite 0 2 1  Feeling bad or failure about yourself  3 3 0  Trouble concentrating 3 3 -  Moving slowly or fidgety/restless 3 3 1   Suicidal thoughts 0 0 0  PHQ-9 Score 15 23  6   Difficult doing work/chores Extremely dIfficult Extremely dIfficult Very difficult   GAD 7 : Generalized Anxiety Score 12/21/2017  Nervous, Anxious, on Edge 1  Control/stop worrying 2  Worry too much - different things 2  Trouble relaxing 2  Restless 3  Easily annoyed or irritable 2  Afraid - awful might happen 3  Total GAD 7 Score 15  Anxiety Difficulty Very difficult     Insomnia- Does taking ambien (prescirbed by Dr. Radene Knee) as needed at bedtime. PDMP reviewed- last refilled on 01/14/19.  Lab Results  Component Value Date   WBC 9.8 02/21/2019   HGB 13.6 02/21/2019   HCT 41.1 02/21/2019   MCV 87 02/21/2019   PLT 336 02/21/2019   Lab Results  Component Value Date   NA 145 (H) 02/21/2019   K 5.0 02/21/2019   CL 106 02/21/2019   CO2 25 02/21/2019   HLD- Lab Results  Component Value Date   CHOL 240 (H) 02/21/2019   HDL 66 02/21/2019   LDLCALC 137 (H) 02/21/2019   TRIG 185 (H) 02/21/2019   CHOLHDL 3.6 02/21/2019   The 10-year ASCVD risk score Dominique Bussing DC Jr., et al., 2013) is: 1.6%   Values used to calculate the score:     Age: 53 years     Sex: Female     Is Non-Hispanic African American: No     Diabetic: No  Tobacco smoker: No     Systolic Blood Pressure: 557 mmHg     Is BP treated: No     HDL Cholesterol: 66 mg/dL     Total Cholesterol: 240 mg/dL    Current Outpatient Medications on File Prior to Visit  Medication Sig Dispense Refill  . acetaminophen (TYLENOL) 650 MG CR tablet Take 1,300 mg by mouth 2 (two) times daily as needed for pain.    Marland Kitchen amphetamine-dextroamphetamine (ADDERALL XR) 10 MG 24 hr capsule Take 1 capsule (10 mg total) by mouth daily. 30 capsule 0  . ARIPiprazole (ABILIFY) 10 MG tablet TAKE 1 TABLET BY MOUTH DAILY 90 tablet 1  . buPROPion (WELLBUTRIN XL) 300 MG 24 hr tablet TAKE 1 TABLET BY MOUTH  DAILY 90 tablet 1  . cetirizine (ZYRTEC) 10 MG tablet Take 10 mg by mouth daily.    . clonazePAM (KLONOPIN) 0.5 MG tablet TAKE 1 TABLET BY  MOUTH DAILY AS NEEDED FOR ANXIETY 30 tablet 0  . estradiol (VIVELLE-DOT) 0.05 MG/24HR patch Place 1 patch onto the skin once a week.    . hydrOXYzine (VISTARIL) 25 MG capsule Take 1 capsule (25 mg total) by mouth daily as needed (severe anxiety). 30 capsule 1  . ibuprofen (ADVIL,MOTRIN) 200 MG tablet Take 800 mg by mouth daily as needed for moderate pain.    Marland Kitchen levothyroxine (SYNTHROID) 88 MCG tablet Take 1 tablet (88 mcg total) by mouth daily. 90 tablet 3  . PROCTOZONE-HC 2.5 % rectal cream U REC BID  1  . rizatriptan (MAXALT) 10 MG tablet Take 1 tablet (10 mg total) by mouth as needed for migraine. May repeat in 2 hours if needed 10 tablet 0  . sertraline (ZOLOFT) 100 MG tablet TAKE 1 TABLET BY MOUTH  DAILY 90 tablet 1  . VITAMIN D PO Take 4,000 Units by mouth daily.    Marland Kitchen zolpidem (AMBIEN) 10 MG tablet Take 1 tablet (10 mg total) by mouth at bedtime as needed for sleep. 30 tablet 0   No current facility-administered medications on file prior to visit.     No Known Allergies  Past Medical History:  Diagnosis Date  . Anxiety   . Bipolar disorder ([place])   . Depression   . GERD (gastroesophageal reflux disease)   . Headache   . Heart murmur   . History of fainting spells of unknown cause   . Hypothyroidism   . Kidney stones   . Migraine   . PONV (postoperative nausea and vomiting)   . Thyroid disease     Past Surgical History:  Procedure Laterality Date  . ABDOMINAL HYSTERECTOMY  2000  . BREAST BIOPSY  1984  . BREAST EXCISIONAL BIOPSY Left   . CHOLECYSTECTOMY  2000  . CYSTOSCOPY N/A 06/26/2016   Procedure: CYSTOSCOPY;  Surgeon: Dominique Nigh, MD;  Location: [facility];  Service: Gynecology;  Laterality: N/A;  . INNER EAR SURGERY    . LAPAROSCOPIC SALPINGO OOPHERECTOMY Bilateral 06/26/2016   Procedure: LAPAROSCOPIC SALPINGO OOPHORECTOMY,BILATERAL;  Surgeon: Dominique Nigh, MD;  Location: [facility];  Service: Gynecology;  Laterality: Bilateral;  . TONSILLECTOMY AND ADENOIDECTOMY  1976  .  WISDOM TOOTH EXTRACTION      Family History  Problem Relation Age of Onset  . Cancer Mother   . Ovarian cancer Mother   . Asthma Mother   . COPD Mother   . Breast cancer Mother 13  . Alcohol abuse Father   . Kidney disease Father   . Diabetes Father   .  Bipolar disorder Father   . Anxiety disorder Father   . Depression Father   . Kidney disease Brother   . Hepatitis C Brother   . Alcohol abuse Brother   . Bipolar disorder Brother   . Anxiety disorder Brother   . Depression Brother   . Stroke Paternal Aunt   . Cancer Paternal Aunt   . Alcohol abuse Maternal Grandfather   . Alcohol abuse Paternal Grandfather   . Arthritis Paternal Grandfather   . Cancer Paternal Grandfather   . Heart disease Paternal Grandfather   . Stroke Paternal Grandfather     Social History   Socioeconomic History  . Marital status: Married    Spouse name: Photographer  . Number of children: 2  . Years of education: Not on file  . Highest education level: Bachelor's degree (e.g., BA, AB, BS)  Occupational History    Comment: full time  Social Needs  . Financial resource strain: Not hard at all  . Food insecurity    Worry: Never true    Inability: Never true  . Transportation needs    Medical: No    Non-medical: No  Tobacco Use  . Smoking status: Never Smoker  . Smokeless tobacco: Never Used  Substance and Sexual Activity  . Alcohol use: No  . Drug use: No  . Sexual activity: Yes    Birth control/protection: Surgical  Lifestyle  . Physical activity    Days per week: 0 days    Minutes per session: 0 min  . Stress: Rather much  Relationships  . Social Herbalist on phone: Once a week    Gets together: Never    Attends religious service: Never    Active member of club or organization: No    Attends meetings of clubs or organizations: Never    Relationship status: Married  . Intimate partner violence    Fear of current or ex partner: No    Emotionally abused: No     Physically abused: No    Forced sexual activity: No  Other Topics Concern  . Not on file  Social History Narrative  . Not on file   The PMH, PSH, Social History, Family History, Medications, and allergies have been reviewed in [facility], and have been updated if relevant.    Review of Systems  Constitutional: Negative for fatigue.  HENT: Negative.   Respiratory: Negative.   Cardiovascular: Negative.   Gastrointestinal: Negative.   Endocrine: Negative.   Genitourinary: Negative.   Musculoskeletal: Negative for back pain.  Allergic/Immunologic: Negative.   Neurological: Negative.   Hematological: Negative.   Psychiatric/Behavioral: Negative for decreased concentration, self-injury, sleep disturbance and suicidal ideas.  All other systems reviewed and are negative.      Objective:    BP 136/86 (BP Location: Left Arm, Patient Position: Sitting, Cuff Size: Normal)   Pulse 68   Temp 98.2 F (36.8 C) (Oral)   Ht 5\' 3"  (1.6 m)   Wt 241 lb 3.2 oz (109.4 kg)   SpO2 97%   BMI 42.73 kg/m   Wt Readings from Last 3 Encounters:  02/28/19 241 lb 3.2 oz (109.4 kg)  12/21/17 249 lb 9.6 oz (113.2 kg)  10/17/16 241 lb 14.4 oz (109.7 kg)     Physical Exam  Constitutional: She is oriented to person, place, and time. She appears well-developed and well-nourished. No distress.  HENT:  Head: Normocephalic and atraumatic.  Eyes: Conjunctivae are normal.  Neck: Normal range of motion. Neck  supple. No thyromegaly present.  Cardiovascular: Normal rate and regular rhythm.  No murmur heard. Pulmonary/Chest: Effort normal and breath sounds normal.  Abdominal: Soft. Bowel sounds are normal. She exhibits no distension. There is no abdominal tenderness.  Musculoskeletal: Normal range of motion.        General: No edema.  Neurological: She is alert and oriented to person, place, and time. No cranial nerve deficit.  Skin: Skin is warm and dry. She is not diaphoretic.  Psychiatric: She has a normal  mood and affect. Her behavior is normal. Judgment and thought content normal.  Nursing note and vitals reviewed.          Assessment & Plan:

## 2019-02-28 ENCOUNTER — Encounter: Payer: Self-pay | Admitting: Family Medicine

## 2019-02-28 ENCOUNTER — Ambulatory Visit (INDEPENDENT_AMBULATORY_CARE_PROVIDER_SITE_OTHER): Payer: BC Managed Care – PPO | Admitting: Family Medicine

## 2019-02-28 VITALS — BP 136/86 | HR 68 | Temp 98.2°F | Ht 63.0 in | Wt 241.2 lb

## 2019-02-28 DIAGNOSIS — E559 Vitamin D deficiency, unspecified: Secondary | ICD-10-CM | POA: Diagnosis not present

## 2019-02-28 DIAGNOSIS — Z0000 Encounter for general adult medical examination without abnormal findings: Secondary | ICD-10-CM | POA: Diagnosis not present

## 2019-02-28 DIAGNOSIS — R7303 Prediabetes: Secondary | ICD-10-CM | POA: Insufficient documentation

## 2019-02-28 DIAGNOSIS — F909 Attention-deficit hyperactivity disorder, unspecified type: Secondary | ICD-10-CM | POA: Diagnosis not present

## 2019-02-28 DIAGNOSIS — F32A Depression, unspecified: Secondary | ICD-10-CM

## 2019-02-28 DIAGNOSIS — E039 Hypothyroidism, unspecified: Secondary | ICD-10-CM | POA: Diagnosis not present

## 2019-02-28 DIAGNOSIS — F329 Major depressive disorder, single episode, unspecified: Secondary | ICD-10-CM | POA: Diagnosis not present

## 2019-02-28 MED ORDER — AMPHETAMINE-DEXTROAMPHET ER 20 MG PO CP24: 20.0000 mg | ORAL_CAPSULE | Freq: Every day | ORAL | 0 refills | Status: DC

## 2019-02-28 NOTE — Assessment & Plan Note (Signed)
Deteriorated. We agreed to a trial of increasing her adderall from 10 mg XL daily to 20 mg XL daily.  eRx sent.  She will keep me updated.

## 2019-02-28 NOTE — Assessment & Plan Note (Addendum)
Deteriorated. Partially situational but also feels like she isn't focusing as well.   See below- we are increasing adderall and keeping her other medications at the same dosage.  She will call to schedule an appointment with her therapist as well. She will update me in a few weeks. Denies SI or HI. The patient indicates understanding of these issues and agrees with the plan.

## 2019-02-28 NOTE — Assessment & Plan Note (Signed)
Improved

## 2019-02-28 NOTE — Assessment & Plan Note (Signed)
Euthyroid.  Continue current dose of synthroid. Lab Results  Component Value Date   TSH 3.010 02/21/2019   T3TOTAL 104 02/21/2019

## 2019-02-28 NOTE — Assessment & Plan Note (Signed)
Reviewed preventive care protocols, scheduled due services, and updated immunizations Discussed nutrition, exercise, diet, and healthy lifestyle.  

## 2019-02-28 NOTE — Patient Instructions (Addendum)
Great to see you!  We are increasing your Adderall to 20 mg daily.  Please keep me updated.

## 2019-02-28 NOTE — Assessment & Plan Note (Signed)
New- discussed cutting back on carbs and sugars- given copy of eat right diet and discussed with pt. Lab Results  Component Value Date   HGBA1C 5.8 (H) 02/21/2019

## 2019-03-04 LAB — HGB A1C W/O EAG: Hgb A1c MFr Bld: 5.8 % — ABNORMAL HIGH (ref 4.8–5.6)

## 2019-03-04 LAB — SPECIMEN STATUS REPORT

## 2019-03-15 DIAGNOSIS — N6459 Other signs and symptoms in breast: Secondary | ICD-10-CM | POA: Diagnosis not present

## 2019-04-01 ENCOUNTER — Other Ambulatory Visit: Payer: Self-pay | Admitting: Family Medicine

## 2019-04-01 MED ORDER — AMPHETAMINE-DEXTROAMPHET ER 20 MG PO CP24: 20.0000 mg | ORAL_CAPSULE | Freq: Every day | ORAL | 0 refills | Status: DC

## 2019-04-01 NOTE — Telephone Encounter (Signed)
Medication Refill - Medication: amphetamine-dextroamphetamine (ADDERALL XR) 20 MG 24 hr capsule  Has the patient contacted their pharmacy? No - needs new script (Agent: If no, request that the patient contact the pharmacy for the refill.) (Agent: If yes, when and what did the pharmacy advise?)  Preferred Pharmacy (with phone number or street name):  [facility] [id number], [place] [phone] (Phone) [phone] (Fax)   Agent: Please be advised that RX refills may take up to 3 business days. We ask that you follow-up with your pharmacy.

## 2019-04-01 NOTE — Telephone Encounter (Signed)
Requested medication (s) are due for refill today: yes  Requested medication (s) are on the active medication list: yes  Last refill:  02/28/2019  Future visit scheduled: no  Notes to clinic:  Refill cannot be delegated    Requested Prescriptions  Pending Prescriptions Disp Refills   amphetamine-dextroamphetamine (ADDERALL XR) 20 MG 24 hr capsule 30 capsule 0    Sig: Take 1 capsule (20 mg total) by mouth daily.     Not Delegated - Psychiatry:  Stimulants/ADHD Failed - 04/01/2019  9:21 AM      Failed - This refill cannot be delegated      Failed - Urine Drug Screen completed in last 360 days.      Passed - Valid encounter within last 3 months    Recent Outpatient Visits          1 month ago Well woman exam without gynecological exam   [facility], [place] M, MD   4 months ago Hypothyroidism, unspecified type   [facility], Marciano Sequin, MD   1 year ago Well woman exam without gynecological exam   [facility], Marciano Sequin, MD   3 years ago Left low back pain, with sciatica presence unspecified   [facility] [place], Angela Adam, MD

## 2019-04-01 NOTE — Telephone Encounter (Signed)
Last refill 8/17 #30 0 refills last OV 8/17 Please advise

## 2019-04-11 DIAGNOSIS — N941 Unspecified dyspareunia: Secondary | ICD-10-CM | POA: Diagnosis not present

## 2019-04-11 DIAGNOSIS — R3915 Urgency of urination: Secondary | ICD-10-CM | POA: Diagnosis not present

## 2019-04-11 DIAGNOSIS — R102 Pelvic and perineal pain: Secondary | ICD-10-CM | POA: Diagnosis not present

## 2019-05-04 ENCOUNTER — Other Ambulatory Visit: Payer: Self-pay | Admitting: Family Medicine

## 2019-05-05 ENCOUNTER — Other Ambulatory Visit: Payer: Self-pay | Admitting: Family Medicine

## 2019-05-05 MED ORDER — AMPHETAMINE-DEXTROAMPHET ER 20 MG PO CP24: 20.0000 mg | ORAL_CAPSULE | Freq: Every day | ORAL | 0 refills | Status: DC

## 2019-05-05 MED ORDER — AMPHETAMINE-DEXTROAMPHET ER 20 MG PO CP24: 20.0000 mg | ORAL_CAPSULE | ORAL | 0 refills | Status: DC

## 2019-05-05 NOTE — Telephone Encounter (Signed)
Requested medication (s) are due for refill today: yes  Requested medication (s) are on the active medication list: yes  Last refill:  04/01/2019  Future visit scheduled: no  Notes to clinic:  Refill cannot be delegated    Requested Prescriptions  Pending Prescriptions Disp Refills   amphetamine-dextroamphetamine (ADDERALL XR) 20 MG 24 hr capsule 30 capsule 0    Sig: Take 1 capsule (20 mg total) by mouth daily.     Not Delegated - Psychiatry:  Stimulants/ADHD Failed - 05/05/2019  9:08 AM      Failed - This refill cannot be delegated      Failed - Urine Drug Screen completed in last 360 days.      Passed - Valid encounter within last 3 months    Recent Outpatient Visits          2 months ago Well woman exam without gynecological exam   [facility], [place] M, MD   6 months ago Hypothyroidism, unspecified type   [facility], Marciano Sequin, MD   1 year ago Well woman exam without gynecological exam   [facility], Marciano Sequin, MD   3 years ago Left low back pain, with sciatica presence unspecified   [facility] [place], Angela Adam, MD

## 2019-05-05 NOTE — Telephone Encounter (Signed)
Last fill 11/17/18  #90/1 Last OV 02/28/19

## 2019-05-05 NOTE — Telephone Encounter (Signed)
TA-Plz see refill req/pt not due for visit till late December/Per [place] PMP pt is compliant without red flags/Rx's prepared and pended for your approval/thx dmf

## 2019-05-05 NOTE — Telephone Encounter (Signed)
Copied from [place] [phone]. Topic: Quick Communication - Rx Refill/Question >> May 05, 2019  8:59 AM Rainey Pines A wrote: Medication: amphetamine-dextroamphetamine (ADDERALL XR) 20 MG 24 hr capsule   Has the patient contacted their pharmacy? Yes (Agent: If no, request that the patient contact the pharmacy for the refill.) (Agent: If yes, when and what did the pharmacy advise?)Contact PCP  Preferred Pharmacy (with phone number or street name): [facility] [id number], [place] [phone] (Phone) [phone] (Fax)    Agent: Please be advised that RX refills may take up to 3 business days. We ask that you follow-up with your pharmacy.

## 2019-05-31 ENCOUNTER — Other Ambulatory Visit: Payer: Self-pay

## 2019-05-31 MED ORDER — ARIPIPRAZOLE 10 MG PO TABS: 10.0000 mg | ORAL_TABLET | Freq: Every day | ORAL | 1 refills | Status: DC

## 2019-05-31 NOTE — Telephone Encounter (Signed)
Last fill 11/29/18  #90/1 Last OV 02/28/19

## 2019-06-16 ENCOUNTER — Other Ambulatory Visit: Payer: Self-pay | Admitting: Family Medicine

## 2019-06-16 MED ORDER — AMPHETAMINE-DEXTROAMPHET ER 20 MG PO CP24: 20.0000 mg | ORAL_CAPSULE | Freq: Every day | ORAL | 0 refills | Status: DC

## 2019-06-16 NOTE — Telephone Encounter (Signed)
Medication Refill - Medication: amphetamine-dextroamphetamine (ADDERALL XR) 20 MG 24 hr capsule  Has the patient contacted their pharmacy? no (Agent: If no, request that the patient contact the pharmacy for the refill.) (Agent: If yes, when and what did the pharmacy advise?)  Preferred Pharmacy (with phone number or street name):  [facility] [id number], [place] [phone] (Phone) [phone] (Fax)   Agent: Please be advised that RX refills may take up to 3 business days. We ask that you follow-up with your pharmacy.

## 2019-06-16 NOTE — Telephone Encounter (Signed)
Requested medication (s) are due for refill today: yes  Requested medication (s) are on the active medication list: yes  Last refill:  05/05/2019  Future visit scheduled: no  Notes to clinic:  Refill cannot be delegated    Requested Prescriptions  Pending Prescriptions Disp Refills   amphetamine-dextroamphetamine (ADDERALL XR) 20 MG 24 hr capsule 30 capsule 0    Sig: Take 1 capsule (20 mg total) by mouth daily.     Not Delegated - Psychiatry:  Stimulants/ADHD Failed - 06/16/2019 11:03 AM      Failed - This refill cannot be delegated      Failed - Urine Drug Screen completed in last 360 days.      Failed - Valid encounter within last 3 months    Recent Outpatient Visits          3 months ago Well woman exam without gynecological exam   [facility], [place] M, MD   7 months ago Hypothyroidism, unspecified type   [facility], Marciano Sequin, MD   1 year ago Well woman exam without gynecological exam   [facility], Marciano Sequin, MD   4 years ago Left low back pain, with sciatica presence unspecified   [facility] [place], Angela Adam, MD

## 2019-06-20 ENCOUNTER — Other Ambulatory Visit: Payer: Self-pay | Admitting: Family Medicine

## 2019-06-27 ENCOUNTER — Ambulatory Visit: Payer: Self-pay | Admitting: *Deleted

## 2019-06-27 DIAGNOSIS — Z20822 Contact with and (suspected) exposure to covid-19: Secondary | ICD-10-CM

## 2019-06-27 NOTE — Telephone Encounter (Signed)
Pt called stating she was exposed to a person who tested positive for covid and is needing to know what protocol to follow. Please advise; she was around the person on 12/12/202; she was exposed 1-2, and the pt did have on a mask; the other person did not; recommendations made per nurse triage protocol; she verbalized understanding; also explained there are community sites that do not require MD order, but do require appt; the pt sees Dr Deborra Medina, [facility] and would like to discuss this with her PCP; the pt can be contacted at  [phone]; will route to office for final disposition. Reason for Disposition . [1] CLOSE CONTACT COVID-19 EXPOSURE within last 14 days AND [2] NO symptoms  Answer Assessment - Initial Assessment Questions 1. COVID-19 CLOSE CONTACT: "Who is the person with the confirmed or suspected COVID-19 infection that you were exposed to?"     Pt's family friend 2. PLACE of CONTACT: "Where were you when you were exposed to COVID-19?" (e.g., home, school, medical waiting room; which city?)     Same building in [place], [place] 3. TYPE of CONTACT: "How much contact was there?" (e.g., sitting next to, live in same house, work in same office, same building)     Same building 4. DURATION of CONTACT: "How long were you in contact with the COVID-19 patient?" (e.g., a few seconds, passed by person, a few minutes, 15 minutes or longer, live with the patient)      1 hour at inside event 5. MASK: "Were you wearing a mask?" "Was the other person wearing a mask?" Note: wearing a mask reduces the risk of an  otherwise close contact.     Yes, no 6. DATE of CONTACT: "When did you have contact with a COVID-19 patient?" (e.g., how many days ago)   212/06/2019* 7. COMMUNITY SPREAD: "Are there lots of cases of COVID-19 (community spread) where you live?" (See public health department website, if unsure)       Community spread 8. SYMPTOMS: "Do you have any symptoms?" (e.g., fever, cough, breathing difficulty,  loss of taste or smell)     no 9. PREGNANCY OR POSTPARTUM: "Is there any chance you are pregnant?" "When was your last menstrual period?" "Did you deliver in the last 2 weeks?"     No hysterectomy 10. HIGH RISK: "Do you have any heart or lung problems? Do you have a weak immune system?" (e.g., heart failure, COPD, asthma, HIV positive, chemotherapy, renal failure, diabetes mellitus, sickle cell anemia, obesity)      Family histotry of COPD, asthma, and heart disease 11.  TRAVEL: "Have you traveled out of the country recently?" If so, "When and where?"  Also ask about out-of-state travel, since the CDC has identified some high-risk cities for community spread in the [place].  Note: Travel becomes less relevant if there is widespread community transmission where the patient lives. no  Protocols used: CORONAVIRUS (COVID-19) EXPOSURE-A-AH

## 2019-06-28 NOTE — Telephone Encounter (Signed)
I have placed an order in case she does want to get tested (not a bad idea). How is she feeling?  General [facility] workflow:   Place [phone] for COVID testing following the same workflow as you currently use for all lab orders.   Place order as normal and lab collect.      [facility] Location: [address] [place] ([facility])   [facility] Location: [address], [place], [place] [address]  [place] ([place])   [facility] Location: [facility], dispensing (across from [place] [facility])   The [place] sites will begin testing [place] starting the week of December 14th (see go-live dates by location below). . In addition, starting the week of December 14th we will move INDOORS for testing, (see locations below). . Community testing site appointment hours will be 8 a.m. to 3:30 p.m. The purpose of these changes is twofold: 1. The staff are very cold as the winter temperatures drop. We felt to care for our teams, we should get them safely indoors. 2. [place] has been covering the cost for self-pay patient tests. We may be able to file for reimbursement through [employer], however we must have an appointment and drop a facility claim. (Please note: Patients will still not be personally billed for Covid-19 tests. [facility] [employer] will [employer] do not balance bill the patient.) If you/your practice has a patient that needs an appointment, please have the patient text "COVID" to [phone], OR they can log on to [email or web address] to easily make an on-line appointment. Please note: The online testing capability will go live starting this Friday, 12/11. If you have a patient who does not have access to a smart phone or PC, you or the patient can call [phone] to get assistance. If you have a patient that needs a test and appointments are full, please send a message to the [facility] or call  [phone]. A provider may request that we overbook additional tests per day. [facility]: APPOINTMENT Go-LIVE - Tuesday, Dec 15th; CLOSED: Monday, Dec 14th for the move to [address] [place], [place] [address] . [facility]: APPOINTMENT Go-Live - Wednesday, Dec 16th; CLOSED Tuesday, Dec 15th for the move indoors (same location) . [facility]: APPOINTMENT Go-Live - Wednesday, Dec 16th; CLOSED Tuesday, Dec 15th for the move indoors (same location)

## 2019-06-28 NOTE — Addendum Note (Signed)
Addended by: Lucille Passy on: 06/28/2019 09:07 AM   Modules accepted: Orders

## 2019-06-29 ENCOUNTER — Other Ambulatory Visit: Payer: Self-pay

## 2019-06-29 ENCOUNTER — Ambulatory Visit: Payer: BC Managed Care – PPO | Attending: Internal Medicine

## 2019-06-29 DIAGNOSIS — Z20822 Contact with and (suspected) exposure to covid-19: Secondary | ICD-10-CM

## 2019-06-29 DIAGNOSIS — Z20828 Contact with and (suspected) exposure to other viral communicable diseases: Secondary | ICD-10-CM | POA: Diagnosis not present

## 2019-07-01 LAB — NOVEL CORONAVIRUS, NAA: SARS-CoV-2, NAA: NOT DETECTED

## 2019-07-04 ENCOUNTER — Ambulatory Visit: Payer: Self-pay | Admitting: Family Medicine

## 2019-07-04 NOTE — Telephone Encounter (Signed)
Pt reports in home direct contact exposure with family that tested positive for covid. Exposure on 06/25/2019. Pt tested 12/16, negative. Pt states 12/18 developed S/S consistent with covid; cough, congestion, loss of sense of taste and smell, states afebrile. Pt has been quarantining since exposure. Advised to continue quarantine, 2 weeks from exposure date, guidelines reviewed. Made aware may have tested too early from exposure, possible false negative; symptoms suggest otherwise.Reviewed symptoms that warrant ED eval.Pt verbalizes understanding.  Reason for Disposition . Health Information question, no triage required and triager able to answer question  Answer Assessment - Initial Assessment Questions 1. REASON FOR CALL or QUESTION: "What is your reason for calling today?" or "How can I best help you?" or "What question do you have that I can help answer?"    Covid questions, exposure  Protocols used: INFORMATION ONLY CALL-A-AH

## 2019-07-05 ENCOUNTER — Ambulatory Visit: Payer: BC Managed Care – PPO | Attending: Internal Medicine

## 2019-07-05 DIAGNOSIS — Z20828 Contact with and (suspected) exposure to other viral communicable diseases: Secondary | ICD-10-CM | POA: Diagnosis not present

## 2019-07-05 DIAGNOSIS — Z20822 Contact with and (suspected) exposure to covid-19: Secondary | ICD-10-CM

## 2019-07-05 NOTE — Telephone Encounter (Signed)
FYI

## 2019-07-07 LAB — NOVEL CORONAVIRUS, NAA: SARS-CoV-2, NAA: DETECTED — AB

## 2019-07-19 ENCOUNTER — Other Ambulatory Visit: Payer: Self-pay | Admitting: Family Medicine

## 2019-07-19 MED ORDER — AMPHETAMINE-DEXTROAMPHET ER 20 MG PO CP24: 20.0000 mg | ORAL_CAPSULE | Freq: Every day | ORAL | 0 refills | Status: DC

## 2019-07-19 NOTE — Telephone Encounter (Signed)
Copied from [place] [phone]. Topic: Quick Communication - Rx Refill/Question >> Jul 19, 2019 10:49 AM Rainey Pines A wrote: Medication: amphetamine-dextroamphetamine (ADDERALL XR) 20 MG 24 hr capsule   Has the patient contacted their pharmacy?Yes (Agent: If no, request that the patient contact the pharmacy for the refill.) (Agent: If yes, when and what did the pharmacy advise?)Contact PCP  Preferred Pharmacy (with phone number or street name): [facility] [id number], [place]  Phone:  [phone] Fax:  [phone]     Agent: Please be advised that RX refills may take up to 3 business days. We ask that you follow-up with your pharmacy.

## 2019-07-19 NOTE — Telephone Encounter (Signed)
Requested medication (s) are due for refill today: yes  Requested medication (s) are on the active medication list:yes  Last refill:  06/16/2019  Future visit scheduled: no  Notes to clinic:  not delegated   Requested Prescriptions  Pending Prescriptions Disp Refills   amphetamine-dextroamphetamine (ADDERALL XR) 20 MG 24 hr capsule 30 capsule 0    Sig: Take 1 capsule (20 mg total) by mouth daily.      There is no refill protocol information for this order

## 2019-08-18 DIAGNOSIS — D2261 Melanocytic nevi of right upper limb, including shoulder: Secondary | ICD-10-CM | POA: Diagnosis not present

## 2019-08-18 DIAGNOSIS — L718 Other rosacea: Secondary | ICD-10-CM | POA: Diagnosis not present

## 2019-08-18 DIAGNOSIS — D225 Melanocytic nevi of trunk: Secondary | ICD-10-CM | POA: Diagnosis not present

## 2019-08-18 DIAGNOSIS — Z85828 Personal history of other malignant neoplasm of skin: Secondary | ICD-10-CM | POA: Diagnosis not present

## 2019-08-19 ENCOUNTER — Telehealth: Payer: Self-pay | Admitting: Family Medicine

## 2019-08-19 NOTE — Telephone Encounter (Signed)
Patient is calling and wanted to speak to someone about medication. CB is [phone]

## 2019-08-19 NOTE — Telephone Encounter (Signed)
Scheduled pt an appt for tele-visit with TA for Monday/thx dmf

## 2019-08-19 NOTE — Telephone Encounter (Signed)
LMOVM for pt to call back with details or send them via MyChart/thx dmf

## 2019-08-21 DIAGNOSIS — F331 Major depressive disorder, recurrent, moderate: Secondary | ICD-10-CM | POA: Insufficient documentation

## 2019-08-21 DIAGNOSIS — F339 Major depressive disorder, recurrent, unspecified: Secondary | ICD-10-CM | POA: Insufficient documentation

## 2019-08-21 DIAGNOSIS — F325 Major depressive disorder, single episode, in full remission: Secondary | ICD-10-CM | POA: Insufficient documentation

## 2019-08-21 MED ORDER — AMPHETAMINE-DEXTROAMPHET ER 20 MG PO CP24: 20.0000 mg | ORAL_CAPSULE | ORAL | 0 refills | Status: DC

## 2019-08-21 MED ORDER — AMPHETAMINE-DEXTROAMPHET ER 20 MG PO CP24: 20.0000 mg | ORAL_CAPSULE | Freq: Every day | ORAL | 0 refills | Status: DC

## 2019-08-21 NOTE — Progress Notes (Signed)
TELEPHONE ENCOUNTER   Patient verbally agreed to telephone visit and is aware that copayment and coinsurance may apply. Patient was treated using telemedicine according to accepted telemedicine protocols.  Location of the patient: Patient's home Location of provider: Provider's home Names of all persons participating in the telemedicine service and role in the encounter: Arnette Norris, MD Velora Heckler  Subjective:   Chief Complaint  Patient presents with   ADHD    Patient is connecting today to follow-up with ADHD.  She currently takes Adderall XR 20mg  1qd and is compliant.  Per [place] PMP there are no red flags.     HPI   Follow up ADHD- Patient is connecting today to follow-up with ADHD. See problem based charting.  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   Edema 03/04/2016   Vitamin D deficiency 02/14/2015   Hypothyroidism 12/26/2014   Depression    Social History   Tobacco Use   Smoking status: Never Smoker   Smokeless tobacco: Never Used  Substance Use Topics   Alcohol use: No    Current Outpatient Medications:    acetaminophen (TYLENOL) 650 MG CR tablet, Take 1,300 mg by mouth 2 (two) times daily as needed for pain., Disp: , Rfl:    amphetamine-dextroamphetamine (ADDERALL XR) 20 MG 24 hr capsule, Take 1 capsule (20 mg total) by mouth every morning., Disp: 30 capsule, Rfl: 0   amphetamine-dextroamphetamine (ADDERALL XR) 20 MG 24 hr capsule, Take 1 capsule (20 mg total) by mouth every morning., Disp: 30 capsule, Rfl: 0   amphetamine-dextroamphetamine (ADDERALL XR) 20 MG 24 hr capsule, Take 1 capsule (20 mg total) by mouth daily., Disp: 30 capsule, Rfl: 0   ARIPiprazole (ABILIFY) 10 MG tablet, Take 1 tablet (10 mg total) by mouth daily., Disp: 90 tablet, Rfl:  1   buPROPion (WELLBUTRIN XL) 300 MG 24 hr tablet, TAKE 1 TABLET BY MOUTH  DAILY, Disp: 90 tablet, Rfl: 3   cetirizine (ZYRTEC) 10 MG tablet, Take 10 mg by mouth daily., Disp: , Rfl:    clonazePAM (KLONOPIN) 0.5 MG tablet, TAKE 1 TABLET BY MOUTH DAILY AS NEEDED FOR ANXIETY, Disp: 30 tablet, Rfl: 0   estradiol (VIVELLE-DOT) 0.05 MG/24HR patch, Place 1 patch onto the skin once a week., Disp: , Rfl:    hydrOXYzine (VISTARIL) 25 MG capsule, Take 1 capsule (25 mg total) by mouth daily as needed (severe anxiety)., Disp: 30 capsule, Rfl: 1   ibuprofen (ADVIL,MOTRIN) 200 MG tablet, Take 800 mg by mouth daily as needed for moderate pain., Disp: , Rfl:    levothyroxine (SYNTHROID) 88 MCG tablet, Take 1 tablet (88 mcg total) by mouth daily., Disp: 90 tablet, Rfl: 3   PROCTOZONE-HC 2.5 % rectal cream, U REC BID, Disp: , Rfl: 1   rizatriptan (MAXALT) 10 MG tablet, Take 1 tablet (10 mg total) by mouth as needed for migraine. May repeat in 2 hours if needed, Disp: 10 tablet, Rfl: 0   sertraline (ZOLOFT) 100 MG tablet, TAKE 1 TABLET BY MOUTH  DAILY, Disp: 90 tablet, Rfl: 3   VITAMIN D PO, Take 4,000 Units by mouth daily., Disp: , Rfl:    zolpidem (AMBIEN) 10 MG tablet, Take 1 tablet (10 mg total) by mouth at bedtime as needed for sleep., Disp: 30 tablet, Rfl: 0  No Known Allergies   Time spent with the patient: 21 minutes, spent in obtaining information about her symptoms, reviewing her previous labs, evaluations, and treatments, counseling her about her condition (please see the discussed topics above), and developing a plan to further investigate it; she had a number of questions which I addressed.   [id number] physician/qualified health professional telephone evaluation 5 to 10 minutes [phone] physician/qualified help functional [place] evaluation for 11 to 20 minutes [id number] physician/qualify he will professional telephone evaluation for 21 to 30 minutes   Lab Results  Component Value Date   WBC 9.8 02/21/2019    HGB 13.6 02/21/2019   HCT 41.1 02/21/2019   PLT 336 02/21/2019   GLUCOSE 125 (H) 02/21/2019   CHOL 240 (H) 02/21/2019   TRIG 185 (H) 02/21/2019   HDL 66 02/21/2019   LDLCALC 137 (H) 02/21/2019   ALT 15 02/21/2019   AST 14 02/21/2019   NA 145 (H) 02/21/2019   K 5.0 02/21/2019   CL 106 02/21/2019   CREATININE 0.77 02/21/2019   BUN 15 02/21/2019   CO2 25 02/21/2019   TSH 3.010 02/21/2019   HGBA1C 5.8 (H) 02/21/2019    Lab Results  Component Value Date   TSH 3.010 02/21/2019   Lab Results  Component Value Date   WBC 9.8 02/21/2019   HGB 13.6 02/21/2019   HCT 41.1 02/21/2019   MCV 87 02/21/2019   PLT 336 02/21/2019   Lab Results  Component Value Date   NA 145 (H) 02/21/2019   K 5.0 02/21/2019   CO2 25 02/21/2019   GLUCOSE 125 (H) 02/21/2019   BUN 15 02/21/2019   CREATININE 0.77 02/21/2019   BILITOT 0.3 02/21/2019   ALKPHOS 92 02/21/2019   AST 14 02/21/2019   ALT 15 02/21/2019   PROT 6.5 02/21/2019   ALBUMIN 4.1 02/21/2019   CALCIUM 10.0 02/21/2019   Lab Results  Component Value Date   CHOL 240 (H) 02/21/2019   Lab Results  Component Value Date   HDL 66 02/21/2019   Lab Results  Component Value Date   LDLCALC 137 (H) 02/21/2019   Lab Results  Component Value Date   TRIG 185 (H) 02/21/2019   Lab Results  Component Value Date   CHOLHDL 3.6 02/21/2019   Lab Results  Component Value Date   HGBA1C 5.8 (H) 02/21/2019       Assessment & Plan:   Problem List Items Addressed This Visit       Active Problems   Attention deficit disorder (ADD) - Primary       I am having [name] maintain her rizatriptan, zolpidem, clonazePAM, acetaminophen, ibuprofen, cetirizine, estradiol, Proctozone-HC, hydrOXYzine, levothyroxine, VITAMIN D PO, sertraline, amphetamine-dextroamphetamine, amphetamine-dextroamphetamine, ARIPiprazole, buPROPion, and amphetamine-dextroamphetamine.  No orders of the defined types were placed in this encounter.    Arnette Norris, MD

## 2019-08-21 NOTE — Assessment & Plan Note (Signed)
  She currently takes Adderall XR 20mg  1qd and is compliant. Per [place] PMP there are no red flags.

## 2019-08-22 ENCOUNTER — Ambulatory Visit: Payer: Self-pay | Admitting: Family Medicine

## 2019-08-24 NOTE — Progress Notes (Signed)
TELEPHONE ENCOUNTER   Patient verbally agreed to telephone visit and is aware that copayment and coinsurance may apply. Patient was treated using telemedicine according to accepted telemedicine protocols.  Location of the patient: Patient's home Location of provider:Provider's home Names of all persons participating in the telemedicine service and role in the encounter: Arnette Norris, MD Velora Heckler  Subjective:   Chief Complaint  Patient presents with  . ADHD     HPI  Patient is connecting today to F/U .  See problem based charting.     Patient Active Problem List   Diagnosis Date Noted  . MDD (major depressive disorder), recurrent episode, moderate ([place]) 08/21/2019  . Prediabetes 02/28/2019  . Attention deficit disorder (ADD) 12/21/2017  . CRP elevated 06/16/2016  . 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 Edema 03/04/2016  . Vitamin D deficiency 02/14/2015  . Hypothyroidism 12/26/2014  . Depression    Social History   Tobacco Use  . Smoking status: Never Smoker  . Smokeless tobacco: Never Used  Substance Use Topics  . Alcohol use: No    Current Outpatient Medications:  .  acetaminophen (TYLENOL) 650 MG CR tablet, Take 1,300 mg by mouth 2 (two) times daily as needed for pain., Disp: , Rfl:  .  amphetamine-dextroamphetamine (ADDERALL XR) 20 MG 24 hr capsule, Take 1 capsule (20 mg total) by mouth every morning., Disp: 30 capsule, Rfl: 0 .  amphetamine-dextroamphetamine (ADDERALL XR) 20 MG 24 hr capsule, Take 1 capsule (20 mg total) by mouth every morning., Disp: 30 capsule, Rfl: 0 .  amphetamine-dextroamphetamine (ADDERALL XR) 20 MG 24 hr capsule, Take 1 capsule (20 mg total) by mouth daily., Disp: 30 capsule, Rfl: 0 .  ARIPiprazole (ABILIFY) 10 MG tablet, Take 1 tablet (10 mg total) by mouth daily., Disp: 90 tablet, Rfl: 1 .  buPROPion (WELLBUTRIN XL) 300 MG 24 hr tablet, TAKE 1 TABLET BY MOUTH  DAILY, Disp: 90 tablet, Rfl: 3 .  cetirizine (ZYRTEC) 10 MG  tablet, Take 10 mg by mouth daily., Disp: , Rfl:  .  clonazePAM (KLONOPIN) 0.5 MG tablet, TAKE 1 TABLET BY MOUTH DAILY AS NEEDED FOR ANXIETY, Disp: 30 tablet, Rfl: 0 .  estradiol (VIVELLE-DOT) 0.05 MG/24HR patch, Place 1 patch onto the skin once a week., Disp: , Rfl:  .  hydrOXYzine (VISTARIL) 25 MG capsule, Take 1 capsule (25 mg total) by mouth daily as needed (severe anxiety)., Disp: 30 capsule, Rfl: 1 .  ibuprofen (ADVIL,MOTRIN) 200 MG tablet, Take 800 mg by mouth daily as needed for moderate pain., Disp: , Rfl:  .  levothyroxine (SYNTHROID) 88 MCG tablet, Take 1 tablet (88 mcg total) by mouth daily., Disp: 90 tablet, Rfl: 3 .  PROCTOZONE-HC 2.5 % rectal cream, U REC BID, Disp: , Rfl: 1 .  rizatriptan (MAXALT) 10 MG tablet, Take 1 tablet (10 mg total) by mouth as needed for migraine. May repeat in 2 hours if needed, Disp: 10 tablet, Rfl: 0 .  sertraline (ZOLOFT) 100 MG tablet, TAKE 1 TABLET BY MOUTH  DAILY, Disp: 90 tablet, Rfl: 3 .  VITAMIN D PO, Take 4,000 Units by mouth daily., Disp: , Rfl:  .  zolpidem (AMBIEN) 10 MG tablet, Take 1 tablet (10 mg total) by mouth at bedtime as needed for sleep., Disp: 30 tablet, Rfl: 0 No Known Allergies  Assessment & Plan:   1. Attention deficit hyperactivity disorder (ADHD), unspecified ADHD type   2. Vitamin D deficiency   3. Hypothyroidism, unspecified type  4. Prediabetes     Orders Placed This Encounter  Procedures  . Pain Mgmt, Profile 8 w/Conf, U  . VITAMIN D 25 Hydroxy (Vit-D Deficiency, Fractures)   No orders of the defined types were placed in this encounter.   Arnette Norris, MD 08/25/2019  Time spent with the patient: 21 minutes, spent in obtaining information about her symptoms, reviewing her previous labs, evaluations, and treatments, counseling her about her condition (please see the discussed topics above), and developing a plan to further investigate it; she had a number of questions which I addressed.   [id number] physician/qualified  health professional telephone evaluation 5 to 10 minutes [phone] physician/qualified help functional [place] evaluation for 11 to 20 minutes [phone] physician/qualify he will professional telephone evaluation for 21 to 30 minutes   Lab Results  Component Value Date   WBC 9.8 02/21/2019   HGB 13.6 02/21/2019   HCT 41.1 02/21/2019   PLT 336 02/21/2019   GLUCOSE 125 (H) 02/21/2019   CHOL 240 (H) 02/21/2019   TRIG 185 (H) 02/21/2019   HDL 66 02/21/2019   LDLCALC 137 (H) 02/21/2019   ALT 15 02/21/2019   AST 14 02/21/2019   NA 145 (H) 02/21/2019   K 5.0 02/21/2019   CL 106 02/21/2019   CREATININE 0.77 02/21/2019   BUN 15 02/21/2019   CO2 25 02/21/2019   TSH 3.010 02/21/2019   HGBA1C 5.8 (H) 02/21/2019    Lab Results  Component Value Date   TSH 3.010 02/21/2019   Lab Results  Component Value Date   WBC 9.8 02/21/2019   HGB 13.6 02/21/2019   HCT 41.1 02/21/2019   MCV 87 02/21/2019   PLT 336 02/21/2019   Lab Results  Component Value Date   NA 145 (H) 02/21/2019   K 5.0 02/21/2019   CO2 25 02/21/2019   GLUCOSE 125 (H) 02/21/2019   BUN 15 02/21/2019   CREATININE 0.77 02/21/2019   BILITOT 0.3 02/21/2019   ALKPHOS 92 02/21/2019   AST 14 02/21/2019   ALT 15 02/21/2019   PROT 6.5 02/21/2019   ALBUMIN 4.1 02/21/2019   CALCIUM 10.0 02/21/2019   Lab Results  Component Value Date   CHOL 240 (H) 02/21/2019   Lab Results  Component Value Date   HDL 66 02/21/2019   Lab Results  Component Value Date   LDLCALC 137 (H) 02/21/2019   Lab Results  Component Value Date   TRIG 185 (H) 02/21/2019   Lab Results  Component Value Date   CHOLHDL 3.6 02/21/2019   Lab Results  Component Value Date   HGBA1C 5.8 (H) 02/21/2019       Assessment & Plan:   Problem List Items Addressed This Visit      Active Problems   Hypothyroidism    History:  Currently taking synthroid 88 mcg daily. Current symptoms: change in energy level. Patient denies change in energy level,  diarrhea and heat / cold intolerance. Symptoms have stabilized. Lab Results  Component Value Date   TSH 3.010 02/21/2019   TSH 2.870 12/21/2017   TSH 3.430 09/18/2016   Lab Results  Component Value Date   FREET4 1.38 02/21/2019   FREET4 1.27 12/21/2017   FREET4 1.27 09/18/2016    Assessment/Plan: Future orders placed. Instructed not to take MVM or iron within 4 hours of taking thyroid medications.  Common Reasons for Abnormal TSH Levels on a Previously Stable Dosage of Thyroid Hormone . Patient nonadherent to thyroid hormone regimen (missing doses) . Decreased absorption of  thyroid hormone . Patient is now taking thyroid hormone with food . Patient takes thyroid hormone within four hours of calcium, iron, soy products, or aluminum-containing antacids . Patient is prescribed medication that decreases absorption of thyroid hormone, such as cholestyramine (Questran), colestipol (Colestid), orlistat (Xenical), or sucralfate (Carafate) . Patient is now pregnant or recently started or stopped estrogen-containing oral contraceptive or hormone therapy . Generic substitution for brand name or vice versa, or substitution of one generic formulation for another . Patient started on sertraline (Zoloft), another selective serotonin reuptake inhibitor, or a tricyclic antidepressant . Patient started on carbamazepine (Tegretol) or phenytoin (Dilantin)          Vitamin D deficiency    History: Lab Results  Component Value Date   VD25OH 31.3 02/21/2019   VD25OH 23.4 (L) 12/21/2017   VD25OH 29.0 (L) 09/18/2016     Assessment/Plan:       Goal vitamin D level > 40. Plan: Vitamin D supplementation.  Future orders placed.       Relevant Orders   VITAMIN D 25 Hydroxy (Vit-D Deficiency, Fractures)   Attention deficit disorder (ADD) - Primary    She currently takes Adderall XR 20mg  1qd and per [place] PMP she is compliant without red flags. She is pleased with this current dose- eRX refills  sent.  Future orders placed for updated CSC and UDS.      Relevant Orders   Pain Mgmt, Profile 8 w/Conf, U   Prediabetes    Check a1c today.         I am having [name] maintain her rizatriptan, zolpidem, clonazePAM, acetaminophen, ibuprofen, cetirizine, estradiol, Proctozone-HC, hydrOXYzine, levothyroxine, VITAMIN D PO, sertraline, ARIPiprazole, buPROPion, amphetamine-dextroamphetamine, amphetamine-dextroamphetamine, and amphetamine-dextroamphetamine.  No orders of the defined types were placed in this encounter.    Arnette Norris, MD

## 2019-08-25 ENCOUNTER — Ambulatory Visit (INDEPENDENT_AMBULATORY_CARE_PROVIDER_SITE_OTHER): Payer: BC Managed Care – PPO | Admitting: Family Medicine

## 2019-08-25 ENCOUNTER — Other Ambulatory Visit: Payer: Self-pay

## 2019-08-25 DIAGNOSIS — E559 Vitamin D deficiency, unspecified: Secondary | ICD-10-CM

## 2019-08-25 DIAGNOSIS — F909 Attention-deficit hyperactivity disorder, unspecified type: Secondary | ICD-10-CM

## 2019-08-25 DIAGNOSIS — E039 Hypothyroidism, unspecified: Secondary | ICD-10-CM | POA: Diagnosis not present

## 2019-08-25 DIAGNOSIS — R7303 Prediabetes: Secondary | ICD-10-CM

## 2019-08-25 NOTE — Assessment & Plan Note (Signed)
She currently takes Adderall XR 20mg  1qd and per [place] PMP she is compliant without red flags. She is pleased with this current dose- eRX refills sent.  Future orders placed for updated CSC and UDS.

## 2019-08-25 NOTE — Assessment & Plan Note (Signed)
History: Lab Results  Component Value Date   VD25OH 31.3 02/21/2019   VD25OH 23.4 (L) 12/21/2017   VD25OH 29.0 (L) 09/18/2016     Assessment/Plan:       Goal vitamin D level > 40. Plan: Vitamin D supplementation.  Future orders placed.

## 2019-08-25 NOTE — Assessment & Plan Note (Signed)
Check a1c today

## 2019-08-25 NOTE — Assessment & Plan Note (Signed)
History:  Currently taking synthroid 88 mcg daily. Current symptoms: change in energy level. Patient denies change in energy level, diarrhea and heat / cold intolerance. Symptoms have stabilized. Lab Results  Component Value Date   TSH 3.010 02/21/2019   TSH 2.870 12/21/2017   TSH 3.430 09/18/2016   Lab Results  Component Value Date   FREET4 1.38 02/21/2019   FREET4 1.27 12/21/2017   FREET4 1.27 09/18/2016    Assessment/Plan: Future orders placed. Instructed not to take MVM or iron within 4 hours of taking thyroid medications.  Common Reasons for Abnormal TSH Levels on a Previously Stable Dosage of Thyroid Hormone . Patient nonadherent to thyroid hormone regimen (missing doses) . Decreased absorption of thyroid hormone . Patient is now taking thyroid hormone with food . Patient takes thyroid hormone within four hours of calcium, iron, soy products, or aluminum-containing antacids . Patient is prescribed medication that decreases absorption of thyroid hormone, such as cholestyramine (Questran), colestipol (Colestid), orlistat (Xenical), or sucralfate (Carafate) . Patient is now pregnant or recently started or stopped estrogen-containing oral contraceptive or hormone therapy . Generic substitution for brand name or vice versa, or substitution of one generic formulation for another . Patient started on sertraline (Zoloft), another selective serotonin reuptake inhibitor, or a tricyclic antidepressant . Patient started on carbamazepine (Tegretol) or phenytoin (Dilantin)

## 2019-08-26 ENCOUNTER — Telehealth: Payer: Self-pay

## 2019-08-26 ENCOUNTER — Telehealth: Payer: Self-pay | Admitting: Family Medicine

## 2019-08-26 DIAGNOSIS — E559 Vitamin D deficiency, unspecified: Secondary | ICD-10-CM

## 2019-08-26 DIAGNOSIS — F909 Attention-deficit hyperactivity disorder, unspecified type: Secondary | ICD-10-CM

## 2019-08-26 MED ORDER — AMPHETAMINE-DEXTROAMPHET ER 20 MG PO CP24: 20.0000 mg | ORAL_CAPSULE | Freq: Every day | ORAL | 0 refills | Status: DC

## 2019-08-26 MED ORDER — AMPHETAMINE-DEXTROAMPHET ER 20 MG PO CP24: 20.0000 mg | ORAL_CAPSULE | ORAL | 0 refills | Status: DC

## 2019-08-26 NOTE — Telephone Encounter (Signed)
MC-Dr. Deborra Medina sent 3 months of ADHD medication during video visit yesterday to [facility] when it should have gone to [facility]/I have prepared and pended to correct pharmacy/would you please send them in for her? Per [place] PMP pt is compliant without red flags/thx dmf

## 2019-08-26 NOTE — Telephone Encounter (Signed)
JC-This pt is going to transfer care to you from Dr. Aron/I have made sure that her labs next week are being routed to you so that they are not left in limbo/thx dmf

## 2019-08-26 NOTE — Addendum Note (Signed)
Addended by: Marrion Coy on: 08/26/2019 10:14 AM   Modules accepted: Orders

## 2019-08-26 NOTE — Telephone Encounter (Signed)
LMOVM stating that the Rx's were sent to pharm/thx dmf

## 2019-08-26 NOTE — Telephone Encounter (Signed)
Patient is calling and requesting a refill for Adderall sent to [facility] on [address] in [place]. CB is [phone]

## 2019-08-26 NOTE — Telephone Encounter (Signed)
noted 

## 2019-08-26 NOTE — Telephone Encounter (Signed)
Rx have been sent. Thank you!

## 2019-08-29 ENCOUNTER — Other Ambulatory Visit: Payer: BC Managed Care – PPO

## 2019-09-07 ENCOUNTER — Other Ambulatory Visit: Payer: Self-pay

## 2019-09-07 MED ORDER — LEVOTHYROXINE SODIUM 88 MCG PO TABS: 88.0000 ug | ORAL_TABLET | Freq: Every day | ORAL | 1 refills | Status: DC

## 2019-09-27 ENCOUNTER — Telehealth: Payer: Self-pay | Admitting: General Practice

## 2019-09-27 NOTE — Telephone Encounter (Signed)
Patient is calling and requesting a refill for Adderall be sent to [facility] on [address] in [place]. CB is [phone]

## 2019-09-27 NOTE — Telephone Encounter (Signed)
Talk to pharmacy, they have Rx we sent in for March and April 2021. Pt has to call pharmacy and request this. Pt is aware.

## 2019-10-05 DIAGNOSIS — H04123 Dry eye syndrome of bilateral lacrimal glands: Secondary | ICD-10-CM | POA: Diagnosis not present

## 2019-10-13 ENCOUNTER — Encounter: Payer: Self-pay | Admitting: Family Medicine

## 2019-10-13 ENCOUNTER — Other Ambulatory Visit: Payer: Self-pay

## 2019-10-13 ENCOUNTER — Ambulatory Visit (INDEPENDENT_AMBULATORY_CARE_PROVIDER_SITE_OTHER): Payer: BC Managed Care – PPO | Admitting: Family Medicine

## 2019-10-13 VITALS — BP 118/68 | HR 85 | Temp 97.9°F | Ht 62.5 in | Wt 244.2 lb

## 2019-10-13 DIAGNOSIS — G4700 Insomnia, unspecified: Secondary | ICD-10-CM | POA: Insufficient documentation

## 2019-10-13 DIAGNOSIS — F909 Attention-deficit hyperactivity disorder, unspecified type: Secondary | ICD-10-CM

## 2019-10-13 DIAGNOSIS — F331 Major depressive disorder, recurrent, moderate: Secondary | ICD-10-CM

## 2019-10-13 DIAGNOSIS — R7303 Prediabetes: Secondary | ICD-10-CM

## 2019-10-13 NOTE — Progress Notes (Signed)
   Subjective:     Dominique Nunez is a 54 y.o. female presenting for Establish Care (xfer from Dr. Deborra Medina, no major concern just needed to est care)     HPI  #Depression/anxiety - saw psychiatry in the past - on abilify - was previously on lithium, denies hx of manic episodes - takes clonopin every other night - last saw psychiatry - 3 years ago - was diagnosed with ADHD 2 years ago  - on adderall and stable on this since diagnosis  #Insomnia - taking [place] - prescribed by GYN - clonazepam - generally taking every other day  Review of Systems   Social History   Tobacco Use  Smoking Status Never Smoker  Smokeless Tobacco Never Used        Objective:    BP Readings from Last 3 Encounters:  10/13/19 118/68  02/28/19 136/86  12/21/17 136/88   Wt Readings from Last 3 Encounters:  10/13/19 244 lb 4 oz (110.8 kg)  02/28/19 241 lb 3.2 oz (109.4 kg)  12/21/17 249 lb 9.6 oz (113.2 kg)    BP 118/68 (BP Location: Left Arm, Patient Position: Sitting, Cuff Size: Large)   Pulse 85   Temp 97.9 F (36.6 C) (Temporal)   Ht 5' 2.5" (1.588 m)   Wt 244 lb 4 oz (110.8 kg)   SpO2 98%   BMI 43.96 kg/m    Physical Exam Constitutional:      General: She is not in acute distress.    Appearance: She is well-developed. She is not diaphoretic.  HENT:     Right Ear: External ear normal.     Left Ear: External ear normal.     Nose: Nose normal.  Eyes:     Conjunctiva/sclera: Conjunctivae normal.  Cardiovascular:     Rate and Rhythm: Normal rate and regular rhythm.     Heart sounds: No murmur.  Pulmonary:     Effort: Pulmonary effort is normal. No respiratory distress.     Breath sounds: Normal breath sounds. No wheezing.  Musculoskeletal:     Cervical back: Neck supple.  Skin:    General: Skin is warm and dry.     Capillary Refill: Capillary refill takes less than 2 seconds.  Neurological:     Mental Status: She is alert. Mental status is at baseline.  Psychiatric:        Mood and Affect: Mood normal.        Behavior: Behavior normal.           Assessment & Plan:   Problem List Items Addressed This Visit      Other   Attention deficit disorder (ADD)    Stable on Adderall. Reports diagnosed a few years ago. Discussed returning for controlled substance agreement and need for regular visit.       Prediabetes    Walking regularly, healthy diet.       MDD (major depressive disorder), recurrent episode, moderate ([place]) - Primary    Stable on wellbutrin, zoloft, and ability. Also taking Klonopin every other day (prescribed by GYN) -- discussed the habit forming nature and if increasing use would recommend follow-up to discuss changing long-term medications.       Insomnia    Stable on ambien. Knows to not take ambien with clonazepam. Both are prescribed by GYN          Return in about 4 weeks (around 11/10/2019).  Lesleigh Noe, MD

## 2019-10-13 NOTE — Patient Instructions (Signed)
Great to meet you!  Return when you are due for Adderall Refill for contract

## 2019-10-13 NOTE — Assessment & Plan Note (Signed)
Stable on wellbutrin, zoloft, and ability. Also taking Klonopin every other day (prescribed by GYN) -- discussed the habit forming nature and if increasing use would recommend follow-up to discuss changing long-term medications.

## 2019-10-13 NOTE — Assessment & Plan Note (Signed)
Walking regularly, healthy diet.

## 2019-10-13 NOTE — Assessment & Plan Note (Signed)
Stable on ambien. Knows to not take ambien with clonazepam. Both are prescribed by GYN

## 2019-10-13 NOTE — Assessment & Plan Note (Signed)
Stable on Adderall. Reports diagnosed a few years ago. Discussed returning for controlled substance agreement and need for regular visit.

## 2019-11-29 ENCOUNTER — Other Ambulatory Visit: Payer: Self-pay

## 2019-11-29 MED ORDER — ARIPIPRAZOLE 10 MG PO TABS: 10.0000 mg | ORAL_TABLET | Freq: Every day | ORAL | 1 refills | Status: DC

## 2019-11-29 NOTE — Telephone Encounter (Signed)
Refill request from the pharmacy for Abilify.  Last filled by Dr Deborra Medina on 05/31/2019 #90 with 1 refill. LOV with Dr Einar Pheasant on 10/13/19.

## 2019-12-26 ENCOUNTER — Telehealth: Payer: Self-pay

## 2019-12-26 NOTE — Telephone Encounter (Signed)
Patient contacted the office requesting a refill on Adderall. Last refilled on 08/26/19 for #30 with 0 refills.  Last refilled by Dr. Scheryl Darter for refill?

## 2019-12-26 NOTE — Telephone Encounter (Signed)
Needs appointment to discuss and complete contract

## 2019-12-28 MED ORDER — AMPHETAMINE-DEXTROAMPHET ER 20 MG PO CP24: 20.0000 mg | ORAL_CAPSULE | ORAL | 0 refills | Status: DC

## 2019-12-28 NOTE — Telephone Encounter (Signed)
Patient called back She scheduled appointment 6/22 for follow up,   Patient stated she only has 3 tablets left and would like to know if some could be sent so she will have enough until her appt

## 2019-12-28 NOTE — Addendum Note (Signed)
Addended by: Waunita Schooner R on: 12/28/2019 10:04 AM   Modules accepted: Orders

## 2019-12-28 NOTE — Telephone Encounter (Signed)
[facility] month supply [facility] on [facility]

## 2019-12-29 ENCOUNTER — Telehealth: Payer: Self-pay

## 2019-12-29 NOTE — Telephone Encounter (Signed)
D-Amphetamine prior authorization submitted via covermymeds.  Waiting response.

## 2019-12-30 NOTE — Telephone Encounter (Signed)
Re routine to Brooke's inbox as I no longer work with Financial [employer]. Thank you.

## 2019-12-30 NOTE — Telephone Encounter (Signed)
F/u    Checking on prior authorization   amphetamine-dextroamphetamine (ADDERALL XR) 20 MG 24 hr capsule  [id number] - [place], [place] AT [address]

## 2020-01-02 NOTE — Telephone Encounter (Signed)
Adderall prior Josem Kaufmann was approved.  [facility] aware.

## 2020-01-03 ENCOUNTER — Encounter: Payer: Self-pay | Admitting: Family Medicine

## 2020-01-03 ENCOUNTER — Other Ambulatory Visit: Payer: Self-pay

## 2020-01-03 ENCOUNTER — Ambulatory Visit: Payer: BC Managed Care – PPO | Admitting: Family Medicine

## 2020-01-03 VITALS — BP 110/80 | HR 74 | Temp 97.6°F | Ht 62.5 in | Wt 242.5 lb

## 2020-01-03 DIAGNOSIS — F331 Major depressive disorder, recurrent, moderate: Secondary | ICD-10-CM | POA: Diagnosis not present

## 2020-01-03 DIAGNOSIS — G4700 Insomnia, unspecified: Secondary | ICD-10-CM

## 2020-01-03 DIAGNOSIS — F909 Attention-deficit hyperactivity disorder, unspecified type: Secondary | ICD-10-CM

## 2020-01-03 MED ORDER — AMPHETAMINE-DEXTROAMPHET ER 20 MG PO CP24: 20.0000 mg | ORAL_CAPSULE | ORAL | 0 refills | Status: DC

## 2020-01-03 NOTE — Assessment & Plan Note (Signed)
Stable on current medications. Endorses some worsening anxiety which is controled with clonazepam (prescribed by her GYN). Discussed increasing zoloft, but she states increased doses do not help. Continue to monitor.

## 2020-01-03 NOTE — Progress Notes (Signed)
Subjective:     Dominique Nunez is a 54 y.o. female presenting for Follow-up (medication- adderall)     HPI   #ADHD - taking the adderall for about 1 year - has a high stress job - does not take on the weekend  - sleep is still good - using ambien occasionally to help with sleep  - adderall does not worsen the anxiety  The ambien and clonazepam are prn and prescribed by Dr. Radene Knee  Taking zoloft, wellbutrin, and abilify  Review of Systems   Social History   Tobacco Use  Smoking Status Never Smoker  Smokeless Tobacco Never Used        Objective:    BP Readings from Last 3 Encounters:  01/03/20 110/80  10/13/19 118/68  02/28/19 136/86   Wt Readings from Last 3 Encounters:  01/03/20 242 lb 8 oz (110 kg)  10/13/19 244 lb 4 oz (110.8 kg)  02/28/19 241 lb 3.2 oz (109.4 kg)    BP 110/80   Pulse 74   Temp 97.6 F (36.4 C) (Temporal)   Ht 5' 2.5" (1.588 m)   Wt 242 lb 8 oz (110 kg)   SpO2 97%   BMI 43.65 kg/m    Physical Exam Constitutional:      General: She is not in acute distress.    Appearance: She is well-developed. She is not diaphoretic.  HENT:     Right Ear: External ear normal.     Left Ear: External ear normal.     Nose: Nose normal.  Eyes:     Conjunctiva/sclera: Conjunctivae normal.  Cardiovascular:     Rate and Rhythm: Normal rate and regular rhythm.     Heart sounds: No murmur heard.   Pulmonary:     Effort: Pulmonary effort is normal. No respiratory distress.     Breath sounds: Normal breath sounds. No wheezing.  Musculoskeletal:     Cervical back: Neck supple.  Skin:    General: Skin is warm and dry.     Capillary Refill: Capillary refill takes less than 2 seconds.  Neurological:     Mental Status: She is alert. Mental status is at baseline.  Psychiatric:        Mood and Affect: Mood normal.        Behavior: Behavior normal.           Assessment & Plan:   Problem List Items Addressed This Visit      Other    Attention deficit disorder (ADD) - Primary    Doing well on current dose. Contract and UDS today. Refill x 3 months.      Relevant Medications   amphetamine-dextroamphetamine (ADDERALL XR) 20 MG 24 hr capsule   amphetamine-dextroamphetamine (ADDERALL XR) 20 MG 24 hr capsule (Start on 03/04/2020)   amphetamine-dextroamphetamine (ADDERALL XR) 20 MG 24 hr capsule (Start on 02/02/2020)   Other Relevant Orders   DRUG MONITORING, PANEL 8 WITH CONFIRMATION, URINE   MDD (major depressive disorder), recurrent episode, moderate (HCC)    Stable on current medications. Endorses some worsening anxiety which is controled with clonazepam (prescribed by her GYN). Discussed increasing zoloft, but she states increased doses do not help. Continue to monitor.       Insomnia    Stable on ambien, prescribed by GYN. Does not need every day          Return in about 3 months (around 04/04/2020).  Lesleigh Noe, MD  This visit occurred during the SARS-CoV-2  public health emergency.  Safety protocols were in place, including screening questions prior to the visit, additional usage of staff PPE, and extensive cleaning of exam room while observing appropriate contact time as indicated for disinfecting solutions.

## 2020-01-03 NOTE — Assessment & Plan Note (Signed)
Stable on ambien, prescribed by GYN. Does not need every day

## 2020-01-03 NOTE — Patient Instructions (Signed)
Great to see you!  Continue Adderall  Return in around 3 months for refill

## 2020-01-03 NOTE — Assessment & Plan Note (Signed)
Doing well on current dose. Contract and UDS today. Refill x 3 months.

## 2020-01-05 LAB — DRUG MONITORING, PANEL 8 WITH CONFIRMATION, URINE
6 Acetylmorphine: NEGATIVE ng/mL (ref <10)
Alcohol Metabolites: NEGATIVE ng/mL
Alphahydroxyalprazolam: NEGATIVE ng/mL (ref <25)
Alphahydroxymidazolam: NEGATIVE ng/mL (ref <50)
Alphahydroxytriazolam: NEGATIVE ng/mL (ref <50)
Aminoclonazepam: 194 ng/mL — ABNORMAL HIGH (ref <25)
Amphetamine: 6560 ng/mL — ABNORMAL HIGH (ref <250)
Amphetamines: POSITIVE ng/mL — AB (ref <500)
Benzodiazepines: POSITIVE ng/mL — AB (ref <100)
Buprenorphine, Urine: NEGATIVE ng/mL (ref <5)
Cocaine Metabolite: NEGATIVE ng/mL (ref <150)
Creatinine: 186.5 mg/dL
Hydroxyethylflurazepam: NEGATIVE ng/mL (ref <50)
Lorazepam: NEGATIVE ng/mL (ref <50)
MDMA: NEGATIVE ng/mL (ref <500)
Marijuana Metabolite: NEGATIVE ng/mL (ref <20)
Methamphetamine: NEGATIVE ng/mL (ref <250)
Nordiazepam: NEGATIVE ng/mL (ref <50)
Opiates: NEGATIVE ng/mL (ref <100)
Oxazepam: NEGATIVE ng/mL (ref <50)
Oxidant: NEGATIVE ug/mL
Oxycodone: NEGATIVE ng/mL (ref <100)
Temazepam: NEGATIVE ng/mL (ref <50)
pH: 5.6 (ref 4.5–9.0)

## 2020-01-05 LAB — DM TEMPLATE

## 2020-02-08 ENCOUNTER — Other Ambulatory Visit: Payer: Self-pay | Admitting: Family Medicine

## 2020-02-10 ENCOUNTER — Other Ambulatory Visit: Payer: Self-pay | Admitting: Family Medicine

## 2020-02-26 ENCOUNTER — Other Ambulatory Visit: Payer: Self-pay | Admitting: Family Medicine

## 2020-03-21 ENCOUNTER — Other Ambulatory Visit: Payer: Self-pay | Admitting: Family Medicine

## 2020-03-21 NOTE — Telephone Encounter (Signed)
Dominique Nunez called needing to get a refill on addrell  [facility] [address] [place]  Pt has 5 pills left

## 2020-03-21 NOTE — Telephone Encounter (Signed)
Spoke to pt and clarified that she should have one more refill [facility] to make it till the end of September. Pt stated that she would [facility]. Also, let her know that she needed an appt to get the next 3 months of refills. Pt scheduled appt.

## 2020-03-23 NOTE — Telephone Encounter (Signed)
Pt said she talked to [facility] and they told her she didn't have a prescription on file for this month. She would like a prescription sent in. She is scheduled to see Dr. Einar Pheasant on 03/28/20 also.

## 2020-03-23 NOTE — Telephone Encounter (Signed)
Last OV: 01/03/20 Last refill: 03/04/20 #30 Next OV: 03/28/20  See phone notes. Not sure why she doesn't have enough to get her to her appt.

## 2020-03-26 ENCOUNTER — Other Ambulatory Visit: Payer: Self-pay | Admitting: *Deleted

## 2020-03-26 MED ORDER — BUPROPION HCL ER (XL) 300 MG PO TB24: 300.0000 mg | ORAL_TABLET | Freq: Every day | ORAL | 1 refills | Status: DC

## 2020-03-26 MED ORDER — SERTRALINE HCL 100 MG PO TABS: 100.0000 mg | ORAL_TABLET | Freq: Every day | ORAL | 1 refills | Status: DC

## 2020-03-28 ENCOUNTER — Ambulatory Visit: Payer: BC Managed Care – PPO | Admitting: Family Medicine

## 2020-03-28 ENCOUNTER — Encounter: Payer: Self-pay | Admitting: Family Medicine

## 2020-03-28 ENCOUNTER — Other Ambulatory Visit: Payer: Self-pay

## 2020-03-28 VITALS — BP 120/78 | HR 75 | Temp 97.1°F | Ht 63.0 in | Wt 242.0 lb

## 2020-03-28 DIAGNOSIS — E039 Hypothyroidism, unspecified: Secondary | ICD-10-CM

## 2020-03-28 DIAGNOSIS — F909 Attention-deficit hyperactivity disorder, unspecified type: Secondary | ICD-10-CM

## 2020-03-28 DIAGNOSIS — Z1159 Encounter for screening for other viral diseases: Secondary | ICD-10-CM

## 2020-03-28 DIAGNOSIS — Z1211 Encounter for screening for malignant neoplasm of colon: Secondary | ICD-10-CM

## 2020-03-28 DIAGNOSIS — E785 Hyperlipidemia, unspecified: Secondary | ICD-10-CM | POA: Diagnosis not present

## 2020-03-28 DIAGNOSIS — R7303 Prediabetes: Secondary | ICD-10-CM

## 2020-03-28 MED ORDER — AMPHETAMINE-DEXTROAMPHET ER 20 MG PO CP24: 20.0000 mg | ORAL_CAPSULE | ORAL | 0 refills | Status: DC

## 2020-03-28 NOTE — Addendum Note (Signed)
Addended by: Ellamae Sia on: 03/28/2020 08:34 AM   Modules accepted: Orders

## 2020-03-28 NOTE — Assessment & Plan Note (Signed)
Doing regular walking. Repeat today to assess. May be a good candidate for metformin to help with weight loss if worsening.

## 2020-03-28 NOTE — Assessment & Plan Note (Addendum)
Goal to continue regular walking and lose 5% of body weight (12 lbs) by next year.

## 2020-03-28 NOTE — Assessment & Plan Note (Signed)
Repeat labs. Stable per patient. Cont current dose

## 2020-03-28 NOTE — Patient Instructions (Signed)
#  Referral I have placed a referral to a specialist for you. You should receive a phone call from the specialty office. Make sure your voicemail is not full and that if you are able to answer your phone to unknown or new numbers.   It may take up to 2 weeks to hear about the referral. If you do not hear anything in 2 weeks, please call our office and ask to speak with the referral coordinator.   

## 2020-03-28 NOTE — Assessment & Plan Note (Signed)
ASCVD 1.3% repeat labs to reassess. Cont healthy living

## 2020-03-28 NOTE — Assessment & Plan Note (Signed)
Doing well. Cont current dose.

## 2020-03-28 NOTE — Progress Notes (Addendum)
Subjective:     Dominique Nunez is a 54 y.o. female presenting for Medication Refill     HPI   #ADHD - Sleep: taking ambien - Appetite: normal - Focus: doing well with, skips adderall on the weekend, just using for work - Anxiety: fairly stable - did have a bad day last week  Working on walking 5 times a week Working on healthy eating  Feeling well overall   Review of Systems   Social History   Tobacco Use  Smoking Status Never Smoker  Smokeless Tobacco Never Used        Objective:    BP Readings from Last 3 Encounters:  03/28/20 120/78  01/03/20 110/80  10/13/19 118/68   Wt Readings from Last 3 Encounters:  03/28/20 242 lb (109.8 kg)  01/03/20 242 lb 8 oz (110 kg)  10/13/19 244 lb 4 oz (110.8 kg)    BP 120/78   Pulse 75   Temp (!) 97.1 F (36.2 C) (Temporal)   Ht 5\' 3"  (1.6 m)   Wt 242 lb (109.8 kg)   SpO2 98%   BMI 42.87 kg/m    Physical Exam Constitutional:      General: She is not in acute distress.    Appearance: She is well-developed. She is not diaphoretic.  HENT:     Right Ear: External ear normal.     Left Ear: External ear normal.     Nose: Nose normal.  Eyes:     Conjunctiva/sclera: Conjunctivae normal.  Cardiovascular:     Rate and Rhythm: Normal rate and regular rhythm.     Heart sounds: No murmur heard.   Pulmonary:     Effort: Pulmonary effort is normal. No respiratory distress.     Breath sounds: Normal breath sounds. No wheezing.  Musculoskeletal:     Cervical back: Neck supple.  Skin:    General: Skin is warm and dry.     Capillary Refill: Capillary refill takes less than 2 seconds.  Neurological:     Mental Status: She is alert. Mental status is at baseline.  Psychiatric:        Mood and Affect: Mood normal.        Behavior: Behavior normal.           Assessment & Plan:   Problem List Items Addressed This Visit      Endocrine   Hypothyroidism - Primary    Repeat labs. Stable per patient. Cont current  dose      Relevant Orders   TSH     Other   Attention deficit disorder (ADD)    Doing well. Cont current dose.       Relevant Medications   amphetamine-dextroamphetamine (ADDERALL XR) 20 MG 24 hr capsule (Start on 04/27/2020)   amphetamine-dextroamphetamine (ADDERALL XR) 20 MG 24 hr capsule (Start on 05/28/2020)   amphetamine-dextroamphetamine (ADDERALL XR) 20 MG 24 hr capsule   Prediabetes    Doing regular walking. Repeat today to assess. May be a good candidate for metformin to help with weight loss if worsening.       Relevant Orders   Hemoglobin A1c   Hyperlipidemia    ASCVD 1.3% repeat labs to reassess. Cont healthy living       Relevant Orders   Lipid panel   Morbid obesity ([place])    Goal to continue regular walking and lose 5% of body weight (12 lbs) by next year.       Relevant Medications  amphetamine-dextroamphetamine (ADDERALL XR) 20 MG 24 hr capsule (Start on 04/27/2020)   amphetamine-dextroamphetamine (ADDERALL XR) 20 MG 24 hr capsule (Start on 05/28/2020)   amphetamine-dextroamphetamine (ADDERALL XR) 20 MG 24 hr capsule   Other Relevant Orders   Comprehensive metabolic panel    Other Visit Diagnoses    Encounter for hepatitis C screening test for low risk patient       Relevant Orders   Hepatitis C antibody   Screening for colon cancer       Relevant Orders   Ambulatory referral to Gastroenterology       Return in about 3 months (around 06/27/2020).  Lesleigh Noe, MD  This visit occurred during the SARS-CoV-2 public health emergency.  Safety protocols were in place, including screening questions prior to the visit, additional usage of staff PPE, and extensive cleaning of exam room while observing appropriate contact time as indicated for disinfecting solutions.

## 2020-03-29 LAB — HEMOGLOBIN A1C
Est. average glucose Bld gHb Est-mCnc: 126 mg/dL
Hgb A1c MFr Bld: 6 % — ABNORMAL HIGH (ref 4.8–5.6)

## 2020-03-29 LAB — LIPID PANEL
Chol/HDL Ratio: 3.3 ratio (ref 0.0–4.4)
Cholesterol, Total: 230 mg/dL — ABNORMAL HIGH (ref 100–199)
HDL: 69 mg/dL (ref >39)
LDL Chol Calc (NIH): 138 mg/dL — ABNORMAL HIGH (ref 0–99)
Triglycerides: 129 mg/dL (ref 0–149)
VLDL Cholesterol Cal: 23 mg/dL (ref 5–40)

## 2020-03-29 LAB — COMPREHENSIVE METABOLIC PANEL
ALT: 15 IU/L (ref 0–32)
AST: 10 IU/L (ref 0–40)
Albumin/Globulin Ratio: 1.8 (ref 1.2–2.2)
Albumin: 4.1 g/dL (ref 3.8–4.9)
Alkaline Phosphatase: 97 IU/L (ref 44–121)
BUN/Creatinine Ratio: 11 (ref 9–23)
BUN: 11 mg/dL (ref 6–24)
Bilirubin Total: 0.3 mg/dL (ref 0.0–1.2)
CO2: 25 mmol/L (ref 20–29)
Calcium: 9.6 mg/dL (ref 8.7–10.2)
Chloride: 102 mmol/L (ref 96–106)
Creatinine, Ser: 1.04 mg/dL — ABNORMAL HIGH (ref 0.57–1.00)
GFR calc Af Amer: 71 mL/min/{1.73_m2} (ref >59)
GFR calc non Af Amer: 62 mL/min/{1.73_m2} (ref >59)
Globulin, Total: 2.3 g/dL (ref 1.5–4.5)
Glucose: 120 mg/dL — ABNORMAL HIGH (ref 65–99)
Potassium: 4.4 mmol/L (ref 3.5–5.2)
Sodium: 139 mmol/L (ref 134–144)
Total Protein: 6.4 g/dL (ref 6.0–8.5)

## 2020-03-29 LAB — TSH: TSH: 2.94 u[IU]/mL (ref 0.450–4.500)

## 2020-03-29 LAB — HEPATITIS C ANTIBODY: Hep C Virus Ab: <0.1 s/co ratio (ref 0.0–0.9)

## 2020-04-04 ENCOUNTER — Encounter: Payer: Self-pay | Admitting: *Deleted

## 2020-04-12 DIAGNOSIS — Z1231 Encounter for screening mammogram for malignant neoplasm of breast: Secondary | ICD-10-CM | POA: Diagnosis not present

## 2020-04-12 DIAGNOSIS — Z13820 Encounter for screening for osteoporosis: Secondary | ICD-10-CM | POA: Diagnosis not present

## 2020-04-12 DIAGNOSIS — Z01419 Encounter for gynecological examination (general) (routine) without abnormal findings: Secondary | ICD-10-CM | POA: Diagnosis not present

## 2020-04-12 DIAGNOSIS — Z6841 Body Mass Index (BMI) 40.0 and over, adult: Secondary | ICD-10-CM | POA: Diagnosis not present

## 2020-04-12 LAB — HM MAMMOGRAPHY

## 2020-04-13 ENCOUNTER — Other Ambulatory Visit: Payer: Self-pay | Admitting: Obstetrics and Gynecology

## 2020-04-13 DIAGNOSIS — Z9189 Other specified personal risk factors, not elsewhere classified: Secondary | ICD-10-CM

## 2020-04-16 LAB — HM PAP SMEAR: HM Pap smear: NEGATIVE

## 2020-04-23 ENCOUNTER — Other Ambulatory Visit: Payer: Self-pay

## 2020-04-23 ENCOUNTER — Telehealth (INDEPENDENT_AMBULATORY_CARE_PROVIDER_SITE_OTHER): Payer: Self-pay | Admitting: Gastroenterology

## 2020-04-23 DIAGNOSIS — Z1211 Encounter for screening for malignant neoplasm of colon: Secondary | ICD-10-CM

## 2020-04-23 NOTE — Progress Notes (Signed)
Gastroenterology Pre-Procedure Review  Request Date: Thursday 07/12/20 Requesting Physician: Dr. Bonna Gains  PATIENT REVIEW QUESTIONS: The patient responded to the following health history questions as indicated:    1. Are you having any GI issues? no 2. Do you have a personal history of Polyps? no 3. Do you have a family history of Colon Cancer or Polyps? no 4. Diabetes Mellitus? no 5. Joint replacements in the past 12 months?no 6. Major health problems in the past 3 months?no 7. Any artificial heart valves, MVP, or defibrillator?no    MEDICATIONS & ALLERGIES:    Patient reports the following regarding taking any anticoagulation/antiplatelet therapy:   Plavix, Coumadin, Eliquis, Xarelto, Lovenox, Pradaxa, Brilinta, or Effient? no Aspirin? no  Patient confirms/reports the following medications:  Current Outpatient Medications  Medication Sig Dispense Refill  . acetaminophen (TYLENOL) 650 MG CR tablet Take 1,300 mg by mouth 2 (two) times daily as needed for pain.    Derrill Memo ON 04/27/2020] amphetamine-dextroamphetamine (ADDERALL XR) 20 MG 24 hr capsule Take 1 capsule (20 mg total) by mouth every morning. 30 capsule 0  . [START ON 05/28/2020] amphetamine-dextroamphetamine (ADDERALL XR) 20 MG 24 hr capsule Take 1 capsule (20 mg total) by mouth every morning. 30 capsule 0  . amphetamine-dextroamphetamine (ADDERALL XR) 20 MG 24 hr capsule Take 1 capsule (20 mg total) by mouth every morning. 30 capsule 0  . ARIPiprazole (ABILIFY) 10 MG tablet Take 1 tablet (10 mg total) by mouth daily. 90 tablet 1  . buPROPion (WELLBUTRIN XL) 300 MG 24 hr tablet Take 1 tablet (300 mg total) by mouth daily. 90 tablet 1  . cetirizine (ZYRTEC) 10 MG tablet Take 10 mg by mouth daily.    . clonazePAM (KLONOPIN) 0.5 MG tablet TAKE 1 TABLET BY MOUTH DAILY AS NEEDED FOR ANXIETY 30 tablet 0  . estradiol (VIVELLE-DOT) 0.05 MG/24HR patch Place 1 patch onto the skin once a week.    . hydrOXYzine (VISTARIL) 25 MG capsule  Take 1 capsule (25 mg total) by mouth daily as needed (severe anxiety). 30 capsule 1  . ibuprofen (ADVIL,MOTRIN) 200 MG tablet Take 800 mg by mouth daily as needed for moderate pain.    . Ivermectin (SOOLANTRA) 1 % CREA Soolantra 1 % topical cream    . levothyroxine (SYNTHROID) 88 MCG tablet Take 1 tablet (88 mcg total) by mouth daily. 90 tablet 1  . ofloxacin (OCUFLOX) 0.3 % ophthalmic solution 1 drop as needed.    Marland Kitchen PROCTOZONE-HC 2.5 % rectal cream U REC BID  1  . rizatriptan (MAXALT) 10 MG tablet Take 1 tablet (10 mg total) by mouth as needed for migraine. May repeat in 2 hours if needed 10 tablet 0  . sertraline (ZOLOFT) 100 MG tablet Take 1 tablet (100 mg total) by mouth daily. 90 tablet 1  . VITAMIN D PO Take 4,000 Units by mouth daily.    Marland Kitchen zolpidem (AMBIEN) 10 MG tablet Take 1 tablet (10 mg total) by mouth at bedtime as needed for sleep. 30 tablet 0  . clonazePAM (KLONOPIN) 0.5 MG disintegrating tablet Take 0.5 mg by mouth daily as needed.     No current facility-administered medications for this visit.    Patient confirms/reports the following allergies:  No Known Allergies  No orders of the defined types were placed in this encounter.   AUTHORIZATION INFORMATION Primary Insurance: 1D#: Group #:  Secondary Insurance: 1D#: Group #:  SCHEDULE INFORMATION: Date: Thursday 07/12/20 Time: Location:[facility]

## 2020-05-02 ENCOUNTER — Telehealth: Payer: Self-pay | Admitting: Family Medicine

## 2020-05-02 NOTE — Telephone Encounter (Signed)
Please call pharmacy.   She should have a refill for 10/15 and 11/15 fill dates. Written on 9/15

## 2020-05-02 NOTE — Telephone Encounter (Signed)
Medication Refill - Medication:  amphetamine-dextroamphetamine (ADDERALL XR) 20 MG 24 hr capsule    Has the patient contacted their pharmacy? Yes advised to call because no refills on file.   Preferred Pharmacy (with phone number or street name):  [id number] [address], [place] Phone:  [phone]  Fax:  [phone]

## 2020-05-02 NOTE — Telephone Encounter (Signed)
Called [facility] and got clarification that they do have refills on file for October and November.  Spoke to pt and let her know they are filling hers today.

## 2020-05-21 ENCOUNTER — Other Ambulatory Visit: Payer: Self-pay | Admitting: Family Medicine

## 2020-07-04 ENCOUNTER — Telehealth: Payer: Self-pay | Admitting: Gastroenterology

## 2020-07-04 NOTE — Telephone Encounter (Signed)
Pt wants to cancel Colonoscopy on Jul 12, 2020, please call

## 2020-07-04 NOTE — Telephone Encounter (Signed)
Colonoscopy was cancelled per patient's request. Patient was notified.

## 2020-07-12 ENCOUNTER — Encounter: Admission: RE | Payer: Self-pay | Source: Home / Self Care

## 2020-07-12 ENCOUNTER — Ambulatory Visit
Admission: RE | Admit: 2020-07-12 | Payer: BC Managed Care – PPO | Source: Home / Self Care | Admitting: Gastroenterology

## 2020-07-12 SURGERY — COLONOSCOPY WITH PROPOFOL
Anesthesia: General

## 2020-07-19 ENCOUNTER — Other Ambulatory Visit: Payer: Self-pay

## 2020-07-19 ENCOUNTER — Ambulatory Visit: Payer: BC Managed Care – PPO | Admitting: Family Medicine

## 2020-07-19 ENCOUNTER — Encounter: Payer: Self-pay | Admitting: Family Medicine

## 2020-07-19 VITALS — BP 124/78 | HR 71 | Temp 97.2°F | Wt 245.0 lb

## 2020-07-19 DIAGNOSIS — F909 Attention-deficit hyperactivity disorder, unspecified type: Secondary | ICD-10-CM

## 2020-07-19 DIAGNOSIS — R7303 Prediabetes: Secondary | ICD-10-CM

## 2020-07-19 DIAGNOSIS — E785 Hyperlipidemia, unspecified: Secondary | ICD-10-CM

## 2020-07-19 MED ORDER — AMPHETAMINE-DEXTROAMPHET ER 20 MG PO CP24: 20.0000 mg | ORAL_CAPSULE | ORAL | 0 refills | Status: DC

## 2020-07-19 MED ORDER — METFORMIN HCL 500 MG PO TABS: 500.0000 mg | ORAL_TABLET | Freq: Two times a day (BID) | ORAL | 3 refills | Status: DC

## 2020-07-19 NOTE — Assessment & Plan Note (Signed)
Doing well. Contract UTD. Refills today

## 2020-07-19 NOTE — Progress Notes (Signed)
Subjective:     Dominique Nunez is a 55 y.o. female presenting for medication consultation (adderall)     HPI  #Adderall - medication working well - no concerns - using less of her sleep aid on the weekend - no change in appetite  #HLD/prediabetes - walking - reduced bacon and greasy foods - healthy greens   Review of Systems   Social History   Tobacco Use  Smoking Status Never Smoker  Smokeless Tobacco Never Used        Objective:    BP Readings from Last 3 Encounters:  07/19/20 124/78  03/28/20 120/78  01/03/20 110/80   Wt Readings from Last 3 Encounters:  07/19/20 245 lb (111.1 kg)  03/28/20 242 lb (109.8 kg)  01/03/20 242 lb 8 oz (110 kg)    BP 124/78   Pulse 71   Temp (!) 97.2 F (36.2 C) (Temporal)   Wt 245 lb (111.1 kg)   SpO2 97%   BMI 43.40 kg/m    Physical Exam Constitutional:      General: She is not in acute distress.    Appearance: She is well-developed. She is not diaphoretic.  HENT:     Right Ear: External ear normal.     Left Ear: External ear normal.     Nose: Nose normal.  Eyes:     Conjunctiva/sclera: Conjunctivae normal.  Cardiovascular:     Rate and Rhythm: Normal rate.  Pulmonary:     Effort: Pulmonary effort is normal.  Musculoskeletal:     Cervical back: Neck supple.  Skin:    General: Skin is warm and dry.     Capillary Refill: Capillary refill takes less than 2 seconds.  Neurological:     Mental Status: She is alert. Mental status is at baseline.  Psychiatric:        Mood and Affect: Mood normal.        Behavior: Behavior normal.     The 10-year ASCVD risk score Denman George DC Jr., et al., 2013) is: 1.3%   Values used to calculate the score:     Age: 34 years     Sex: Female     Is Non-Hispanic African American: No     Diabetic: No     Tobacco smoker: No     Systolic Blood Pressure: 124 mmHg     Is BP treated: No     HDL Cholesterol: 69 mg/dL     Total Cholesterol: 230 mg/dL       Assessment &  Plan:   Problem List Items Addressed This Visit      Other   Attention deficit disorder (ADD) - Primary    Doing well. Contract UTD. Refills today      Relevant Medications   amphetamine-dextroamphetamine (ADDERALL XR) 20 MG 24 hr capsule   amphetamine-dextroamphetamine (ADDERALL XR) 20 MG 24 hr capsule (Start on 09/16/2020)   amphetamine-dextroamphetamine (ADDERALL XR) 20 MG 24 hr capsule (Start on 08/19/2020)   Prediabetes    Discussed trial of metformin for weight loss and to slow the progression.       Relevant Medications   metFORMIN (GLUCOPHAGE) 500 MG tablet   Hyperlipidemia    Working on walking and healthy diet. ASCVD low risk          Return in about 3 months (around 10/17/2020) for adhd and prediabetes.  Lynnda Child, MD  This visit occurred during the SARS-CoV-2 public health emergency.  Safety protocols were in place, including  screening questions prior to the visit, additional usage of staff PPE, and extensive cleaning of exam room while observing appropriate contact time as indicated for disinfecting solutions.

## 2020-07-19 NOTE — Assessment & Plan Note (Signed)
Working on walking and healthy diet. ASCVD low risk

## 2020-07-19 NOTE — Assessment & Plan Note (Signed)
Discussed trial of metformin for weight loss and to slow the progression.

## 2020-07-19 NOTE — Patient Instructions (Signed)
Metformin I am prescribing a 500 mg tablet. The most common side effect is stomach upset (nausea and diarrhea). If you are experiencing side effects, do not move on to the next week unless your symptoms are better.   Week 1: Take 500 mg in the morning with food Week 2: Take 500 mg in the morning and the evening with food   Return in 3 months for an appointment.

## 2020-08-17 ENCOUNTER — Encounter: Payer: Self-pay | Admitting: Obstetrics and Gynecology

## 2020-08-20 DIAGNOSIS — L718 Other rosacea: Secondary | ICD-10-CM | POA: Diagnosis not present

## 2020-08-20 DIAGNOSIS — Z85828 Personal history of other malignant neoplasm of skin: Secondary | ICD-10-CM | POA: Diagnosis not present

## 2020-08-20 DIAGNOSIS — D225 Melanocytic nevi of trunk: Secondary | ICD-10-CM | POA: Diagnosis not present

## 2020-08-20 DIAGNOSIS — Z1283 Encounter for screening for malignant neoplasm of skin: Secondary | ICD-10-CM | POA: Diagnosis not present

## 2020-08-27 ENCOUNTER — Other Ambulatory Visit: Payer: Self-pay | Admitting: Family Medicine

## 2020-08-29 ENCOUNTER — Telehealth: Payer: Self-pay | Admitting: Family Medicine

## 2020-08-29 NOTE — Telephone Encounter (Signed)
Patient is in need of a refill on Adderall.  Please send to [facility] [address] (near [address]) EM

## 2020-08-29 NOTE — Telephone Encounter (Signed)
Mychart message sent to pt notifying her that she has refills at her pharmacy for Adderall.

## 2020-10-17 ENCOUNTER — Other Ambulatory Visit: Payer: Self-pay | Admitting: Family Medicine

## 2020-11-14 ENCOUNTER — Telehealth: Payer: Self-pay | Admitting: Family Medicine

## 2020-11-14 NOTE — Telephone Encounter (Signed)
Called pt and got in on 5/10 @ 8am

## 2020-11-14 NOTE — Telephone Encounter (Signed)
  LAST APPOINTMENT DATE: 10/17/2020   NEXT APPOINTMENT DATE:@Visit  date not found  MEDICATION: Adderall 20mg   [facility] [address]   Let patient know to contact pharmacy at the end of the day to make sure medication is ready.  Please notify patient to allow 48-72 hours to process  Encourage patient to contact the pharmacy for refills or they can request refills through [place]:   LAST REFILL:  QTY:  REFILL DATE:    OTHER COMMENTS:    Okay for refill?  Please advise

## 2020-11-20 ENCOUNTER — Telehealth: Payer: Self-pay

## 2020-11-20 ENCOUNTER — Ambulatory Visit: Payer: BC Managed Care – PPO | Admitting: Family Medicine

## 2020-11-20 NOTE — Telephone Encounter (Signed)
[facility] Night - Client Nonclinical Telephone Record AccessNurse Client [place] Night - Client Client Site [place] Physician Waunita Schooner- MD Contact Type Call Who Is Calling Patient / Member / Family / Caregiver Caller Name Orlinda Slomski Caller Phone Number [phone] Patient Name Dominique Nunez Patient DOB [birth year] Call Type Message Only Information Provided Reason for Call Request to [facility] Appointment Initial Comment Caller states she has an appt and does not feel well and needs to cancel. Patient request to speak to RN No Additional Comment hrs prov / declined triage Disp. Time Disposition Final User 11/20/2020 7:29:11 AM General Information Provided Yes Idolina Primer Call Closed By: Idolina Primer Transaction Date/Time: 11/20/2020 7:27:16 AM (ET)

## 2020-11-20 NOTE — Telephone Encounter (Signed)
Pt had appt with Dr Einar Pheasant 11/20/20 at 8 AM. I spoke with pt and she has N&V& diarrhea; pt said the stomach bug is going thru her house but pt is in no distress at this time and pt will cb to reschedule the refill appt she missed today at 8 AM. Pt declined virtual appt in re; to N&V&Diarrhea.Sending note to Barbee Shropshire and Dr Einar Pheasant as Juluis Rainier.

## 2020-11-20 NOTE — Telephone Encounter (Signed)
Noted, ok to cancel appt given GI symptoms - no charge

## 2020-11-27 ENCOUNTER — Other Ambulatory Visit: Payer: Self-pay | Admitting: Family Medicine

## 2020-11-29 ENCOUNTER — Ambulatory Visit: Payer: BC Managed Care – PPO | Admitting: Family Medicine

## 2020-12-06 ENCOUNTER — Ambulatory Visit: Payer: BC Managed Care – PPO | Admitting: Family Medicine

## 2020-12-11 ENCOUNTER — Other Ambulatory Visit: Payer: Self-pay

## 2020-12-11 ENCOUNTER — Ambulatory Visit: Payer: BC Managed Care – PPO | Admitting: Family Medicine

## 2020-12-11 VITALS — BP 110/80 | HR 79 | Temp 97.1°F | Wt 242.0 lb

## 2020-12-11 DIAGNOSIS — F331 Major depressive disorder, recurrent, moderate: Secondary | ICD-10-CM

## 2020-12-11 DIAGNOSIS — G4700 Insomnia, unspecified: Secondary | ICD-10-CM

## 2020-12-11 DIAGNOSIS — F909 Attention-deficit hyperactivity disorder, unspecified type: Secondary | ICD-10-CM

## 2020-12-11 MED ORDER — AMPHETAMINE-DEXTROAMPHET ER 20 MG PO CP24
20.0000 mg | ORAL_CAPSULE | ORAL | 0 refills | Status: DC
Start: 2021-02-09 — End: 2021-04-01

## 2020-12-11 MED ORDER — AMPHETAMINE-DEXTROAMPHET ER 20 MG PO CP24: 20.0000 mg | ORAL_CAPSULE | ORAL | 0 refills | Status: DC

## 2020-12-11 NOTE — Patient Instructions (Addendum)
Reach out if depression is not improving and I can place a psych consult   [place] location:   [facility] [phone]-  -Counseling/therapy (14 years and up- Medicaid insurance only)   -Psychiatry services most insurances accepted-treat and evaluate/diagnose patients and prescribe medications.  -Medication Management 53 years old to 31 years.  -Diagnoses treated: Depression/anxiety, ADHD, Substance Abuse, Bipolar Disorder, Psychotherapy, Pain Management, Spiritual Care Services.  [facility] (35 years of age and older) [phone]- Psychology services/therapy only.  [facility] [phone]- All ages-Just Therapy services   [facility] [phone]- [facility]-counseling only.   [facility] [phone]- All ages-Psychology and Psychiatrist services (evaluate, treat, diagnose, prescribe medication) Treat all the diagnoses that would fall under Behavioral issues.   For new patients, on Monday, Wednesday and Friday from 8 am to 3 pm patient would just walk in and fill out paperwork and they would get patient enrolled in the services they need based on their answers.   [facility] [phone]- All ages. Monday through Friday-9am to 4 pm walk in times for patients to come in and be evaluated. Medication Management only at this time. No therapy available.   [place] location:   [facility] Address: [address] [place] [address] Phone: [phone] Counseling and psychiatry services.   [facility] and [place] locations)- [phone]- all ages, only therapy/counseling services/psychological evaluations. Services: aptitude testing, academic achievement testing, learning Disability evaluation, ADHD evaluations, psycho-educational evaluations, readiness for kindergarten Evaluations.   [place] location only has therapy services right now  [facility] [employer] [phone] services only. Works off [address] in [place].

## 2020-12-11 NOTE — Progress Notes (Signed)
Subjective:     Dominique Nunez is a 55 y.o. female presenting for Medication Refill     HPI   #ADHD - continues to take adderall - working well for patient   #depression - feels this is worse - has been on zoloft for a long time - abilify is newer and started by her previous pcp - not in therapy currently - taking medications - saw a psychiatrist a long time ago - take clonazepam 0.5 mg - was on depakote and lithium and was told she may be bipolar - but did not feel this treated her depression - but denies hx of manic episodes  Review of Systems   Social History   Tobacco Use  Smoking Status Never Smoker  Smokeless Tobacco Never Used        Objective:    BP Readings from Last 3 Encounters:  12/11/20 110/80  07/19/20 124/78  03/28/20 120/78   Wt Readings from Last 3 Encounters:  12/11/20 242 lb (109.8 kg)  07/19/20 245 lb (111.1 kg)  03/28/20 242 lb (109.8 kg)    BP 110/80   Pulse 79   Temp (!) 97.1 F (36.2 C) (Temporal)   Wt 242 lb (109.8 kg)   SpO2 97%   BMI 42.87 kg/m    Physical Exam Constitutional:      General: She is not in acute distress.    Appearance: She is well-developed. She is not diaphoretic.  HENT:     Right Ear: External ear normal.     Left Ear: External ear normal.     Nose: Nose normal.  Eyes:     Conjunctiva/sclera: Conjunctivae normal.  Cardiovascular:     Rate and Rhythm: Normal rate.  Pulmonary:     Effort: Pulmonary effort is normal.  Musculoskeletal:     Cervical back: Neck supple.  Skin:    General: Skin is warm and dry.     Capillary Refill: Capillary refill takes less than 2 seconds.  Neurological:     Mental Status: She is alert. Mental status is at baseline.  Psychiatric:        Mood and Affect: Mood normal. Affect is flat.        Behavior: Behavior normal.           Assessment & Plan:   Problem List Items Addressed This Visit      Other   Attention deficit disorder (ADD) - Primary     Controlled. Cont adderall 20 mg      Relevant Medications   amphetamine-dextroamphetamine (ADDERALL XR) 20 MG 24 hr capsule   amphetamine-dextroamphetamine (ADDERALL XR) 20 MG 24 hr capsule (Start on 02/09/2021)   amphetamine-dextroamphetamine (ADDERALL XR) 20 MG 24 hr capsule (Start on 01/10/2021)   MDD (major depressive disorder), recurrent episode, moderate (HCC)    Worse. Advised therapy and numbers provided. Discussed that with her already on Abilify 10 mg, zoloft 100 mg, wellbutrin 300 mg I felt medication adjustments and changes would best be handled with the support of psychiatry. She has previously been on depakote and lithium and didn't like these. At this time she will watch and wait. She will reach out if wanting a change. Discussed I could change her SSRI but otherwise would defer to psych.       Insomnia    Stable on ambien. Cont.           Return in about 3 months (around 03/13/2021) for annual wellness visit.  Lesleigh Noe,  MD  This visit occurred during the SARS-CoV-2 public health emergency.  Safety protocols were in place, including screening questions prior to the visit, additional usage of staff PPE, and extensive cleaning of exam room while observing appropriate contact time as indicated for disinfecting solutions.

## 2020-12-11 NOTE — Assessment & Plan Note (Signed)
Worse. Advised therapy and numbers provided. Discussed that with her already on Abilify 10 mg, zoloft 100 mg, wellbutrin 300 mg I felt medication adjustments and changes would best be handled with the support of psychiatry. She has previously been on depakote and lithium and didn't like these. At this time she will watch and wait. She will reach out if wanting a change. Discussed I could change her SSRI but otherwise would defer to psych.

## 2020-12-11 NOTE — Assessment & Plan Note (Signed)
Stable on ambien. Cont.

## 2020-12-11 NOTE — Assessment & Plan Note (Signed)
Controlled. Cont adderall 20 mg

## 2021-03-13 ENCOUNTER — Encounter: Payer: BC Managed Care – PPO | Admitting: Family Medicine

## 2021-04-01 ENCOUNTER — Ambulatory Visit (INDEPENDENT_AMBULATORY_CARE_PROVIDER_SITE_OTHER): Payer: BC Managed Care – PPO | Admitting: Family Medicine

## 2021-04-01 ENCOUNTER — Other Ambulatory Visit: Payer: Self-pay

## 2021-04-01 VITALS — BP 108/70 | HR 73 | Temp 97.0°F | Ht 62.75 in | Wt 236.1 lb

## 2021-04-01 DIAGNOSIS — Z0000 Encounter for general adult medical examination without abnormal findings: Secondary | ICD-10-CM | POA: Diagnosis not present

## 2021-04-01 DIAGNOSIS — G43809 Other migraine, not intractable, without status migrainosus: Secondary | ICD-10-CM | POA: Diagnosis not present

## 2021-04-01 DIAGNOSIS — F331 Major depressive disorder, recurrent, moderate: Secondary | ICD-10-CM

## 2021-04-01 DIAGNOSIS — Z23 Encounter for immunization: Secondary | ICD-10-CM

## 2021-04-01 DIAGNOSIS — E039 Hypothyroidism, unspecified: Secondary | ICD-10-CM | POA: Diagnosis not present

## 2021-04-01 DIAGNOSIS — E785 Hyperlipidemia, unspecified: Secondary | ICD-10-CM | POA: Diagnosis not present

## 2021-04-01 DIAGNOSIS — Z1211 Encounter for screening for malignant neoplasm of colon: Secondary | ICD-10-CM

## 2021-04-01 DIAGNOSIS — F909 Attention-deficit hyperactivity disorder, unspecified type: Secondary | ICD-10-CM

## 2021-04-01 MED ORDER — AMPHETAMINE-DEXTROAMPHET ER 20 MG PO CP24: 20.0000 mg | ORAL_CAPSULE | ORAL | 0 refills | Status: DC

## 2021-04-01 MED ORDER — LEVOTHYROXINE SODIUM 88 MCG PO TABS: 88.0000 ug | ORAL_TABLET | Freq: Every day | ORAL | 3 refills | Status: DC

## 2021-04-01 MED ORDER — RIZATRIPTAN BENZOATE 10 MG PO TABS
10.0000 mg | ORAL_TABLET | ORAL | 0 refills | Status: DC | PRN
Start: 2021-04-01 — End: 2021-06-20

## 2021-04-01 NOTE — Progress Notes (Signed)
Annual Exam   Chief Complaint:  Chief Complaint  Patient presents with   Annual Exam    No concerns    Medication Refill    Adderrall Lorrin Mais (this gets sent to [facility] on [address])    History of Present Illness:  Dominique Nunez is a 55 y.o. No obstetric history on file. who LMP was No LMP recorded. Patient has had a hysterectomy., presents today for her annual examination.     Nutrition She does not get adequate calcium and Vitamin D in her diet. Diet: cereal in the AM (w/ milk), generally healthy - leafy greens Exercise: walking daily  Safety The patient wears seatbelts: yes.     The patient feels safe at home and in their relationships: yes.  Dentist - need to schedule Eye: has appt  Menstrual:  Symptoms of menopause: none   GYN She is single partner, contraception - status post hysterectomy.    Cervical Cancer Screening (21-65):   Last pap prior to hysterectomy  Breast Cancer Screening (Age 9-74):  There is no FH of breast cancer. There is no FH of ovarian cancer. BRCA screening Not Indicated.  Last Mammogram: 2021 The patient does want a mammogram this year.    Colon Cancer Screening:  Age 81-75 yo - benefits outweigh the risk. Adults 61-85 yo who have never been screened benefit.  Benefits: 134000 people in 2016 will be diagnosed and 49,000 will die - early detection helps [place]: Complications 2/2 to colonoscopy High Risk (Colonoscopy): genetic disorder (Lynch syndrome or familial adenomatous polyposis), personal hx of IBD, previous adenomatous polyp, or previous colorectal cancer, FamHx start 10 years before the age at diagnosis, increased in males and black race  Options:  FIT - looks for hemoglobin (blood in the stool) - specific and fairly sensitive - must be done annually Cologuard - looks for DNA and blood - more sensitive - therefore can have more false positives, every 3 years Colonoscopy - every 10 years if normal - sedation, bowl  prep, must have someone drive you  Shared decision making and the patient had decided to do colonoscopy.   Social History   Tobacco Use  Smoking Status Never  Smokeless Tobacco Never    Lung Cancer Screening (Ages 8-80): yes   Weight Wt Readings from Last 3 Encounters:  04/01/21 236 lb 2 oz (107.1 kg)  12/11/20 242 lb (109.8 kg)  07/19/20 245 lb (111.1 kg)   Patient has very high BMI  BMI Readings from Last 1 Encounters:  04/01/21 42.16 kg/m     Chronic disease screening Blood pressure monitoring:  BP Readings from Last 3 Encounters:  04/01/21 108/70  12/11/20 110/80  07/19/20 124/78    Lipid Monitoring: Indication for screening: age >75, obesity, diabetes, family hx, CV risk factors.  Lipid screening: Yes  Lab Results  Component Value Date   CHOL 230 (H) 03/28/2020   HDL 69 03/28/2020   LDLCALC 138 (H) 03/28/2020   TRIG 129 03/28/2020   CHOLHDL 3.3 03/28/2020     Diabetes Screening: age >9, overweight, family hx, PCOS, hx of gestational diabetes, at risk ethnicity Diabetes Screening screening: Yes  Lab Results  Component Value Date   HGBA1C 6.0 (H) 03/28/2020     Past Medical History:  Diagnosis Date   Anxiety    Bipolar disorder ([place])    Depression    GERD (gastroesophageal reflux disease)    Headache    Heart murmur    History of fainting spells  of unknown cause    Hypothyroidism    Kidney stones    Migraine    PONV (postoperative nausea and vomiting)    Thyroid disease     Past Surgical History:  Procedure Laterality Date   ABDOMINAL HYSTERECTOMY  2000   BREAST BIOPSY  1984   BREAST EXCISIONAL BIOPSY Left    CHOLECYSTECTOMY  2000   CYSTOSCOPY N/A 06/26/2016   Procedure: CYSTOSCOPY;  Surgeon: Arvella Nigh, MD;  Location: [facility];  Service: Gynecology;  Laterality: N/A;   INNER EAR SURGERY     LAPAROSCOPIC SALPINGO OOPHERECTOMY Bilateral 06/26/2016   Procedure: LAPAROSCOPIC SALPINGO OOPHORECTOMY,BILATERAL;  Surgeon: Arvella Nigh,  MD;  Location: [facility];  Service: Gynecology;  Laterality: Bilateral;   TONSILLECTOMY AND ADENOIDECTOMY  1976   WISDOM TOOTH EXTRACTION      Prior to Admission medications   Medication Sig Start Date End Date Taking? Authorizing Provider  acetaminophen (TYLENOL) 650 MG CR tablet Take 1,300 mg by mouth 2 (two) times daily as needed for pain.   Yes [provider]  ARIPiprazole (ABILIFY) 10 MG tablet TAKE 1 TABLET(10 MG) BY MOUTH DAILY 11/27/20  Yes Lesleigh Noe, MD  buPROPion (WELLBUTRIN XL) 300 MG 24 hr tablet TAKE 1 TABLET BY MOUTH  DAILY 10/18/20  Yes Lesleigh Noe, MD  cetirizine (ZYRTEC) 10 MG tablet Take 10 mg by mouth daily.   Yes [provider]  clonazePAM (KLONOPIN) 0.5 MG disintegrating tablet Take 0.5 mg by mouth daily as needed. 04/03/20  Yes [provider]  DOTTI 0.1 MG/24HR patch Place onto the skin 2 (two) times a week. 05/22/20  Yes [provider]  ibuprofen (ADVIL,MOTRIN) 200 MG tablet Take 800 mg by mouth daily as needed for moderate pain.   Yes [provider]  Ivermectin (SOOLANTRA) 1 % CREA Soolantra 1 % topical cream   Yes [provider]  levothyroxine (SYNTHROID) 88 MCG tablet Take 1 tablet (88 mcg total) by mouth daily. 09/07/19  Yes Libby Maw, MD  metFORMIN (GLUCOPHAGE) 500 MG tablet Take 1 tablet (500 mg total) by mouth 2 (two) times daily with a meal. 07/19/20  Yes Lesleigh Noe, MD  ofloxacin (OCUFLOX) 0.3 % ophthalmic solution 1 drop as needed. 10/05/19  Yes [provider]  PROCTOZONE-HC 2.5 % rectal cream U REC BID 08/06/17  Yes [provider]  rizatriptan (MAXALT) 10 MG tablet Take 1 tablet (10 mg total) by mouth as needed for migraine. May repeat in 2 hours if needed 01/05/15  Yes Lucille Passy, MD  sertraline (ZOLOFT) 100 MG tablet TAKE 1 TABLET BY MOUTH  DAILY 08/28/20  Yes Lesleigh Noe, MD  VITAMIN D PO Take 4,000 Units by mouth daily.   Yes [provider]   zolpidem (AMBIEN) 10 MG tablet Take 1 tablet (10 mg total) by mouth at bedtime as needed for sleep. 01/05/15  Yes Lucille Passy, MD    No Known Allergies  Gynecologic History: No LMP recorded. Patient has had a hysterectomy.  Obstetric History: No obstetric history on file.  Social History   Socioeconomic History   Marital status: Married    Spouse name: Photographer   Number of children: 2   Years of education: Not on file   Highest education level: Bachelor's degree (e.g., BA, AB, BS)  Occupational History    Comment: full time  Tobacco Use   Smoking status: Never   Smokeless tobacco: Never  Vaping Use   Vaping Use: Never  used  Substance and Sexual Activity   Alcohol use: Yes    Comment: rare   Drug use: No   Sexual activity: Yes    Birth control/protection: Surgical  Other Topics Concern   Not on file  Social History Narrative   10/13/19   From: the area   Living: with Husband Thayer Jew (1994)   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, 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   Dog: Optician, dispensing      Family: 2 adult children - Jarrett [place] and [place]       Enjoys: relax, walking, reading      Exercise: walking the dog and with husbant   Diet: 2 green veggies a night, meat, does like snacks      Safety   Seat belts: Yes    Guns: Yes  and secure   Safe in relationships: Yes    Social Determinants of Radio broadcast assistant Strain: Not on file  Food Insecurity: Not on file  Transportation Needs: Not on file  Physical Activity: Not on file  Stress: Not on file  Social Connections: Not on file  Intimate Partner Violence: Not on file    Family History  Problem Relation Age of Onset   Ovarian cancer Mother    Asthma Mother    COPD Mother    Breast cancer Mother 2   Alcohol abuse Father    Kidney disease Father    Diabetes Father    Bipolar disorder Father    Anxiety disorder Father    Depression Father    Kidney disease Brother    Hepatitis C Brother    Alcohol abuse Brother     Bipolar disorder Brother    Anxiety disorder Brother    Depression Brother    Stroke Paternal Aunt    Breast cancer Paternal Aunt    Alcohol abuse Maternal Grandfather    Alcohol abuse Paternal Grandfather    Arthritis Paternal Grandfather    Heart disease Paternal Grandfather    Stroke Paternal Grandfather    Pancreatic cancer Paternal Grandfather     Review of Systems  Constitutional:  Negative for chills and fever.  HENT:  Negative for congestion and sore throat.   Eyes:  Negative for blurred vision and double vision.  Respiratory:  Negative for shortness of breath.   Cardiovascular:  Negative for chest pain.  Gastrointestinal:  Negative for heartburn, nausea and vomiting.  Genitourinary: Negative.   Musculoskeletal: Negative.  Negative for myalgias.  Skin:  Negative for rash.  Neurological:  Negative for dizziness and headaches.  Endo/Heme/Allergies:  Does not bruise/bleed easily.  Psychiatric/Behavioral:  Positive for depression. The patient is nervous/anxious.     Physical Exam BP 108/70   Pulse 73   Temp (!) 97 F (36.1 C) (Temporal)   Ht 5' 2.75" (1.594 m)   Wt 236 lb 2 oz (107.1 kg)   SpO2 97%   BMI 42.16 kg/m    BP Readings from Last 3 Encounters:  04/01/21 108/70  12/11/20 110/80  07/19/20 124/78      Physical Exam Constitutional:      General: She is not in acute distress.    Appearance: She is well-developed. She is not diaphoretic.  HENT:     Head: Normocephalic and atraumatic.     Right Ear: External ear normal.     Left Ear: External ear normal.     Nose: Nose normal.  Eyes:     General: No scleral icterus.    Extraocular Movements: Extraocular  movements intact.     Conjunctiva/sclera: Conjunctivae normal.  Cardiovascular:     Rate and Rhythm: Normal rate and regular rhythm.     Heart sounds: No murmur heard. Pulmonary:     Effort: Pulmonary effort is normal. No respiratory distress.     Breath sounds: Normal breath sounds. No wheezing.   Abdominal:     General: Bowel sounds are normal. There is no distension.     Palpations: Abdomen is soft. There is no mass.     Tenderness: There is no abdominal tenderness. There is no guarding or rebound.  Musculoskeletal:        General: Normal range of motion.     Cervical back: Neck supple.  Lymphadenopathy:     Cervical: No cervical adenopathy.  Skin:    General: Skin is warm and dry.     Capillary Refill: Capillary refill takes less than 2 seconds.  Neurological:     Mental Status: She is alert and oriented to person, place, and time.     Deep Tendon Reflexes: Reflexes normal.  Psychiatric:        Mood and Affect: Mood normal.        Behavior: Behavior normal.    Results:  PHQ-9:  [facility] Office Visit from 04/01/2021 in [place] [facility] at [place]  PHQ-9 Total Score 24         Assessment: 55 y.o. No obstetric history on file. female here for routine annual physical examination.  Plan: Problem List Items Addressed This Visit       Endocrine   Hypothyroidism   Relevant Medications   levothyroxine (SYNTHROID) 88 MCG tablet   Other Relevant Orders   CBC   TSH     Other   Attention deficit disorder (ADD)    Stable, takes most days but occasionally does not need medication. Cont adderall xr.       Relevant Medications   amphetamine-dextroamphetamine (ADDERALL XR) 20 MG 24 hr capsule   amphetamine-dextroamphetamine (ADDERALL XR) 20 MG 24 hr capsule (Start on 05/24/2021)   amphetamine-dextroamphetamine (ADDERALL XR) 20 MG 24 hr capsule (Start on 04/24/2021)   amphetamine-dextroamphetamine (ADDERALL XR) 20 MG 24 hr capsule (Start on 06/23/2021)   Other Relevant Orders   Ambulatory referral to Psychiatry   MDD (major depressive disorder), recurrent episode, moderate ([place])    Pt with worsening symptoms on current treatment. Referral to psych for support in managing.       Relevant Orders   Ambulatory referral to Psychiatry   Hyperlipidemia    Relevant Orders   CBC   Lipid panel   Morbid obesity ([place])   Relevant Medications   amphetamine-dextroamphetamine (ADDERALL XR) 20 MG 24 hr capsule   amphetamine-dextroamphetamine (ADDERALL XR) 20 MG 24 hr capsule (Start on 05/24/2021)   amphetamine-dextroamphetamine (ADDERALL XR) 20 MG 24 hr capsule (Start on 04/24/2021)   amphetamine-dextroamphetamine (ADDERALL XR) 20 MG 24 hr capsule (Start on 06/23/2021)   Other Relevant Orders   CBC   Comprehensive metabolic panel   Other Visit Diagnoses     Annual physical exam    -  Primary   Other migraine without status migrainosus, not intractable       Relevant Medications   rizatriptan (MAXALT) 10 MG tablet   Screening for colon cancer       Relevant Orders   Ambulatory referral to Gastroenterology   Need for influenza vaccination       Relevant Orders   Flu Vaccine QUAD 51moIM (Fluarix, Fluzone &  Alfiuria Quad PF)       Screening: -- Blood pressure screen normal -- cholesterol screening: will obtain -- Weight screening: obese: discussed management options, including lifestyle, dietary, and exercise. -- Diabetes Screening: will obtain -- Nutrition: Encouraged healthy diet  The 10-year ASCVD risk score (Arnett DK, et al., 2019) is: 1.1%   Values used to calculate the score:     Age: 19 years     Sex: Female     Is Non-Hispanic African American: No     Diabetic: No     Tobacco smoker: No     Systolic Blood Pressure: 683 mmHg     Is BP treated: No     HDL Cholesterol: 69 mg/dL     Total Cholesterol: 230 mg/dL  -- Statin therapy for Age 52-75 with CVD risk >7.5%  Psych -- Depression screening (PHQ-9):  [place] Visit from 04/01/2021 in [facility] at [facility]  PHQ-9 Total Score 24        Safety -- tobacco screening: not using -- alcohol screening:  low-risk usage. -- no evidence of domestic violence or intimate partner violence.   Cancer Screening -- pap smear not collected per ASCCP  guidelines -- family history of breast cancer screening: done. not at high risk. -- Mammogram -  records requested -- Colon cancer (age 85+)-- ordered  Immunizations Immunization History  Administered Date(s) Administered   Influenza,inj,Quad PF,6+ Mos 04/30/2017   Tdap 09/18/2016    -- flu vaccine up to date -- TDAP q10 years up to date -- Shingles (age >41) not up to date - discussed - pt will return  -- Covid-19 Vaccine not up to date - pt declined   Encouraged healthy diet and exercise. Encouraged regular vision and dental care.    Lesleigh Noe, MD

## 2021-04-01 NOTE — Assessment & Plan Note (Signed)
Pt with worsening symptoms on current treatment. Referral to psych for support in managing.

## 2021-04-01 NOTE — Assessment & Plan Note (Signed)
Stable, takes most days but occasionally does not need medication. Cont adderall xr.

## 2021-04-01 NOTE — Patient Instructions (Addendum)
   Would recommend the following - Bone Health :   1) 800 units of Vitamin D daily 2) Get 1200 mg of elemental calcium --- this is best from your diet. Try to track how much calcium you get on a typical day. You could find ways to add more (dairy products, leafy greens). Take a supplement for whatever you don't typically get so you reach 1200 mg of calcium.  3) Physical activity (ideally weight bearing) - like walking briskly 30 minutes 5 days a week.    Schedule a shingles vaccine on a Friday - nurse visit  #Referral I have placed a referral to a specialist for you. You should receive a phone call from the specialty office. Make sure your voicemail is not full and that if you are able to answer your phone to unknown or new numbers.   It may take up to 2 weeks to hear about the referral. If you do not hear anything in 2 weeks, please call our office and ask to speak with the referral coordinator.

## 2021-04-02 ENCOUNTER — Telehealth: Payer: Self-pay

## 2021-04-02 ENCOUNTER — Other Ambulatory Visit: Payer: Self-pay | Admitting: *Deleted

## 2021-04-02 DIAGNOSIS — R7303 Prediabetes: Secondary | ICD-10-CM

## 2021-04-02 DIAGNOSIS — E039 Hypothyroidism, unspecified: Secondary | ICD-10-CM

## 2021-04-02 LAB — COMPREHENSIVE METABOLIC PANEL
ALT: 12 IU/L (ref 0–32)
AST: 11 IU/L (ref 0–40)
Albumin/Globulin Ratio: 1.9 (ref 1.2–2.2)
Albumin: 4.2 g/dL (ref 3.8–4.9)
Alkaline Phosphatase: 92 IU/L (ref 44–121)
BUN/Creatinine Ratio: 16 (ref 9–23)
BUN: 12 mg/dL (ref 6–24)
Bilirubin Total: 0.3 mg/dL (ref 0.0–1.2)
CO2: 24 mmol/L (ref 20–29)
Calcium: 9.5 mg/dL (ref 8.7–10.2)
Chloride: 102 mmol/L (ref 96–106)
Creatinine, Ser: 0.76 mg/dL (ref 0.57–1.00)
Globulin, Total: 2.2 g/dL (ref 1.5–4.5)
Glucose: 112 mg/dL — ABNORMAL HIGH (ref 65–99)
Potassium: 4.7 mmol/L (ref 3.5–5.2)
Sodium: 138 mmol/L (ref 134–144)
Total Protein: 6.4 g/dL (ref 6.0–8.5)
eGFR: 94 mL/min/{1.73_m2} (ref >59)

## 2021-04-02 LAB — LIPID PANEL
Chol/HDL Ratio: 3.4 ratio (ref 0.0–4.4)
Cholesterol, Total: 233 mg/dL — ABNORMAL HIGH (ref 100–199)
HDL: 69 mg/dL (ref >39)
LDL Chol Calc (NIH): 138 mg/dL — ABNORMAL HIGH (ref 0–99)
Triglycerides: 147 mg/dL (ref 0–149)
VLDL Cholesterol Cal: 26 mg/dL (ref 5–40)

## 2021-04-02 LAB — CBC
Hematocrit: 41.6 % (ref 34.0–46.6)
Hemoglobin: 13.6 g/dL (ref 11.1–15.9)
MCH: 28.6 pg (ref 26.6–33.0)
MCHC: 32.7 g/dL (ref 31.5–35.7)
MCV: 88 fL (ref 79–97)
Platelets: 373 10*3/uL (ref 150–450)
RBC: 4.75 x10E6/uL (ref 3.77–5.28)
RDW: 13.1 % (ref 11.7–15.4)
WBC: 9.2 10*3/uL (ref 3.4–10.8)

## 2021-04-02 LAB — TSH: TSH: 2.21 u[IU]/mL (ref 0.450–4.500)

## 2021-04-03 ENCOUNTER — Encounter: Payer: Self-pay | Admitting: *Deleted

## 2021-04-03 ENCOUNTER — Encounter: Payer: Self-pay | Admitting: Family Medicine

## 2021-04-04 ENCOUNTER — Telehealth: Payer: Self-pay

## 2021-04-04 LAB — HEMOGLOBIN A1C
Est. average glucose Bld gHb Est-mCnc: 123 mg/dL
Hgb A1c MFr Bld: 5.9 % — ABNORMAL HIGH (ref 4.8–5.6)

## 2021-04-04 LAB — SPECIMEN STATUS REPORT

## 2021-04-04 NOTE — Telephone Encounter (Signed)
Returned patients call. LVM to call back °

## 2021-04-05 NOTE — Progress Notes (Signed)
Unable to contact to schedule. Letter will be mailed out.

## 2021-04-16 ENCOUNTER — Telehealth: Payer: Self-pay

## 2021-04-16 NOTE — Telephone Encounter (Signed)
Pt. Calling to schedule a colonoscopy

## 2021-04-17 ENCOUNTER — Other Ambulatory Visit: Payer: Self-pay

## 2021-04-17 DIAGNOSIS — Z1211 Encounter for screening for malignant neoplasm of colon: Secondary | ICD-10-CM

## 2021-04-17 MED ORDER — NA SULFATE-K SULFATE-MG SULF 17.5-3.13-1.6 GM/177ML PO SOLN: 1.0000 | Freq: Once | ORAL | 0 refills | Status: AC

## 2021-04-17 NOTE — Telephone Encounter (Signed)
Procedure scheduled for 07/19/2021.

## 2021-04-17 NOTE — Progress Notes (Signed)
Gastroenterology Pre-Procedure Review  Request Date: 07/19/2021 Requesting Physician: Dr. Vicente Males  PATIENT REVIEW QUESTIONS: The patient responded to the following health history questions as indicated:    1. Are you having any GI issues? no 2. Do you have a personal history of Polyps? no 3. Do you have a family history of Colon Cancer or Polyps? no 4. Diabetes Mellitus?  Prediabetic 5. Joint replacements in the past 12 months?no 6. Major health problems in the past 3 months?no 7. Any artificial heart valves, MVP, or defibrillator?no    MEDICATIONS & ALLERGIES:    Patient reports the following regarding taking any anticoagulation/antiplatelet therapy:   Plavix, Coumadin, Eliquis, Xarelto, Lovenox, Pradaxa, Brilinta, or Effient? no Aspirin? no  Patient confirms/reports the following medications:  Current Outpatient Medications  Medication Sig Dispense Refill   acetaminophen (TYLENOL) 650 MG CR tablet Take 1,300 mg by mouth 2 (two) times daily as needed for pain.     amphetamine-dextroamphetamine (ADDERALL XR) 20 MG 24 hr capsule Take 1 capsule (20 mg total) by mouth every morning. 30 capsule 0   [START ON 05/24/2021] amphetamine-dextroamphetamine (ADDERALL XR) 20 MG 24 hr capsule Take 1 capsule (20 mg total) by mouth every morning. 30 capsule 0   [START ON 04/24/2021] amphetamine-dextroamphetamine (ADDERALL XR) 20 MG 24 hr capsule Take 1 capsule (20 mg total) by mouth every morning. 30 capsule 0   [START ON 06/23/2021] amphetamine-dextroamphetamine (ADDERALL XR) 20 MG 24 hr capsule Take 1 capsule (20 mg total) by mouth every morning. 30 capsule 0   ARIPiprazole (ABILIFY) 10 MG tablet TAKE 1 TABLET(10 MG) BY MOUTH DAILY 90 tablet 1   buPROPion (WELLBUTRIN XL) 300 MG 24 hr tablet TAKE 1 TABLET BY MOUTH  DAILY 90 tablet 3   cetirizine (ZYRTEC) 10 MG tablet Take 10 mg by mouth daily.     clonazePAM (KLONOPIN) 0.5 MG disintegrating tablet Take 0.5 mg by mouth daily as needed.     DOTTI 0.1  MG/24HR patch Place onto the skin 2 (two) times a week.     ibuprofen (ADVIL,MOTRIN) 200 MG tablet Take 800 mg by mouth daily as needed for moderate pain.     Ivermectin (SOOLANTRA) 1 % CREA Soolantra 1 % topical cream     levothyroxine (SYNTHROID) 88 MCG tablet Take 1 tablet (88 mcg total) by mouth daily. 90 tablet 3   metFORMIN (GLUCOPHAGE) 500 MG tablet Take 1 tablet (500 mg total) by mouth 2 (two) times daily with a meal. 180 tablet 3   ofloxacin (OCUFLOX) 0.3 % ophthalmic solution 1 drop as needed.     PROCTOZONE-HC 2.5 % rectal cream U REC BID  1   rizatriptan (MAXALT) 10 MG tablet Take 1 tablet (10 mg total) by mouth as needed for migraine. May repeat in 2 hours if needed 10 tablet 0   sertraline (ZOLOFT) 100 MG tablet TAKE 1 TABLET BY MOUTH  DAILY 90 tablet 3   VITAMIN D PO Take 4,000 Units by mouth daily.     zolpidem (AMBIEN) 10 MG tablet Take 1 tablet (10 mg total) by mouth at bedtime as needed for sleep. 30 tablet 0   No current facility-administered medications for this visit.    Patient confirms/reports the following allergies:  No Known Allergies  No orders of the defined types were placed in this encounter.   AUTHORIZATION INFORMATION Primary Insurance: 1D#: Group #:  Secondary Insurance: 1D#: Group #:  SCHEDULE INFORMATION: Date: 07/19/2021 Time: Location: [place]

## 2021-05-07 ENCOUNTER — Other Ambulatory Visit: Payer: Self-pay | Admitting: Family Medicine

## 2021-06-19 ENCOUNTER — Other Ambulatory Visit: Payer: Self-pay | Admitting: Family Medicine

## 2021-06-19 DIAGNOSIS — G43809 Other migraine, not intractable, without status migrainosus: Secondary | ICD-10-CM

## 2021-07-05 ENCOUNTER — Telehealth: Payer: Self-pay | Admitting: Family Medicine

## 2021-07-05 NOTE — Telephone Encounter (Signed)
Called [name] and informed her of needing to scheduling an appointment and I have her scheuled for 1/5 @11  with tab

## 2021-07-05 NOTE — Telephone Encounter (Signed)
°  Encourage patient to contact the pharmacy for refills or they can request refills through [facility]:  Please schedule appointment if longer than 1 year  NEXT APPOINTMENT DATE:no future appt  MEDICATION:amphetamine-dextroamphetamine (ADDERALL XR) 20 MG 24 hr capsule  Is the patient out of medication? no  PHARMACY:[id number] - [place], [place]  Let patient know to contact pharmacy at the end of the day to make sure medication is ready.  Please notify patient to allow 48-72 hours to process  CLINICAL FILLS OUT ALL BELOW:   LAST REFILL:  QTY:  REFILL DATE:    OTHER COMMENTS:    Okay for refill?  Please advise

## 2021-07-16 ENCOUNTER — Telehealth: Payer: Self-pay | Admitting: Gastroenterology

## 2021-07-16 NOTE — Telephone Encounter (Signed)
Inbound call from pt requesting to cancel her procedure. Pt tested positive for COVID.

## 2021-07-17 ENCOUNTER — Other Ambulatory Visit: Payer: Self-pay

## 2021-07-17 ENCOUNTER — Other Ambulatory Visit: Payer: Self-pay | Admitting: Family Medicine

## 2021-07-17 ENCOUNTER — Encounter: Payer: Self-pay | Admitting: Family Medicine

## 2021-07-17 ENCOUNTER — Ambulatory Visit: Payer: BC Managed Care – PPO

## 2021-07-17 ENCOUNTER — Other Ambulatory Visit: Payer: BC Managed Care – PPO

## 2021-07-17 ENCOUNTER — Telehealth (INDEPENDENT_AMBULATORY_CARE_PROVIDER_SITE_OTHER): Payer: BC Managed Care – PPO | Admitting: Family Medicine

## 2021-07-17 DIAGNOSIS — J069 Acute upper respiratory infection, unspecified: Secondary | ICD-10-CM

## 2021-07-17 LAB — POC INFLUENZA A&B (BINAX/QUICKVUE)
Influenza A, POC: NEGATIVE
Influenza B, POC: NEGATIVE

## 2021-07-17 MED ORDER — PREDNISONE 10 MG PO TABS: ORAL_TABLET | ORAL | 0 refills | Status: DC

## 2021-07-17 NOTE — Patient Instructions (Signed)
Drink fluids and rest  mucinex DM is good for cough and congestion  Nasal saline for congestion as needed  Tylenol for fever or pain or headache  Please alert [place] if symptoms worsen (if severe or short of breath please go to the ER)   Take the prednisone as directed for wheezing and nasal congestion   The office will call you to set up testing for covid and flu

## 2021-07-17 NOTE — Telephone Encounter (Signed)
Procedure has been cancelled. Endo unit notified of change.

## 2021-07-17 NOTE — Progress Notes (Signed)
Virtual Visit via Video Note  I connected with Dominique Nunez on 07/17/21 at 12:00 PM EST by a video enabled telemedicine application and verified that I am speaking with the correct person using two identifiers.  Location: Patient: home Provider: office   I discussed the limitations of evaluation and management by telemedicine and the availability of in person appointments. The patient expressed understanding and agreed to proceed.  Parties involved in encounter  Patient: Dominique Nunez  Provider:  Loura Pardon MD   History of Present Illness: 56 yo pt of Dr Dominique Nunez presents with cough and congestion with fever (max of 102)  Symptoms started Saturday night  Fever started Sunday- up to 102  (down now - 100.3)  Lot of congestion  Bad cough -horrible - productive yellow in color  Some dry cough  Some wheezing- unusual for her  Body aches and chills  No sore throat Itchy ears  Sinus pain and pressure is bad, worse on the right   Did a covid test Friday - it was negative  Has not been to urgent   Has had a flu shot   Takes maxalt for migraine   Otc Mucinex DM  Advil Tylenol / then tylenol cold and flu -did not work   Patient Active Problem List   Diagnosis Date Noted   Viral URI with cough 07/17/2021   Hyperlipidemia 03/28/2020   Morbid obesity ([place]) 03/28/2020   Insomnia 10/13/2019   MDD (major depressive disorder), recurrent episode, moderate (Dominique Nunez) 08/21/2019   Prediabetes 02/28/2019   Attention deficit disorder (ADD) 12/21/2017   CRP elevated 06/16/2016   Chronic arthralgias of knees and hips 05/22/2016   Edema 03/04/2016   Vitamin D deficiency 02/14/2015   Hypothyroidism 12/26/2014   Depression    Past Medical History:  Diagnosis Date   Anxiety    Bipolar disorder (HCC)    Depression    GERD (gastroesophageal reflux disease)    Headache    Heart murmur    History of fainting spells of unknown cause    Hypothyroidism    Kidney stones    Migraine    PONV  (postoperative nausea and vomiting)    Thyroid disease    Past Surgical History:  Procedure Laterality Date   ABDOMINAL HYSTERECTOMY  2000   BREAST BIOPSY  1984   BREAST EXCISIONAL BIOPSY Left    CHOLECYSTECTOMY  2000   CYSTOSCOPY N/A 06/26/2016   Procedure: CYSTOSCOPY;  Surgeon: Arvella Nigh, MD;  Location: [facility];  Service: Gynecology;  Laterality: N/A;   INNER EAR SURGERY     LAPAROSCOPIC SALPINGO OOPHERECTOMY Bilateral 06/26/2016   Procedure: LAPAROSCOPIC SALPINGO OOPHORECTOMY,BILATERAL;  Surgeon: Arvella Nigh, MD;  Location: [facility];  Service: Gynecology;  Laterality: Bilateral;   TONSILLECTOMY AND ADENOIDECTOMY  1976   WISDOM TOOTH EXTRACTION     Social History   Tobacco Use   Smoking status: Never   Smokeless tobacco: Never  Vaping Use   Vaping Use: Never used  Substance Use Topics   Alcohol use: Yes    Comment: rare   Drug use: No   Family History  Problem Relation Age of Onset   Ovarian cancer Mother    Asthma Mother    COPD Mother    Breast cancer Mother 29   Alcohol abuse Father    Kidney disease Father    Diabetes Father    Bipolar disorder Father    Anxiety disorder Father    Depression Father    Kidney disease Brother  Hepatitis C Brother    Alcohol abuse Brother    Bipolar disorder Brother    Anxiety disorder Brother    Depression Brother    Stroke Paternal Aunt    Breast cancer Paternal Aunt    Alcohol abuse Maternal Grandfather    Alcohol abuse Paternal Grandfather    Arthritis Paternal Grandfather    Heart disease Paternal Grandfather    Stroke Paternal Grandfather    Pancreatic cancer Paternal Grandfather    No Known Allergies Current Outpatient Medications on File Prior to Visit  Medication Sig Dispense Refill   acetaminophen (TYLENOL) 650 MG CR tablet Take 1,300 mg by mouth 2 (two) times daily as needed for pain.     amphetamine-dextroamphetamine (ADDERALL XR) 20 MG 24 hr capsule Take 1 capsule (20 mg total) by mouth every morning. 30  capsule 0   amphetamine-dextroamphetamine (ADDERALL XR) 20 MG 24 hr capsule Take 1 capsule (20 mg total) by mouth every morning. 30 capsule 0   amphetamine-dextroamphetamine (ADDERALL XR) 20 MG 24 hr capsule Take 1 capsule (20 mg total) by mouth every morning. 30 capsule 0   amphetamine-dextroamphetamine (ADDERALL XR) 20 MG 24 hr capsule Take 1 capsule (20 mg total) by mouth every morning. 30 capsule 0   ARIPiprazole (ABILIFY) 10 MG tablet TAKE 1 TABLET(10 MG) BY MOUTH DAILY 90 tablet 1   buPROPion (WELLBUTRIN XL) 300 MG 24 hr tablet TAKE 1 TABLET BY MOUTH  DAILY 90 tablet 3   cetirizine (ZYRTEC) 10 MG tablet Take 10 mg by mouth daily.     clonazePAM (KLONOPIN) 0.5 MG disintegrating tablet Take 0.5 mg by mouth daily as needed.     DOTTI 0.1 MG/24HR patch Place onto the skin 2 (two) times a week.     ibuprofen (ADVIL,MOTRIN) 200 MG tablet Take 800 mg by mouth daily as needed for moderate pain.     Ivermectin (SOOLANTRA) 1 % CREA Soolantra 1 % topical cream     levothyroxine (SYNTHROID) 88 MCG tablet Take 1 tablet (88 mcg total) by mouth daily. 90 tablet 3   metFORMIN (GLUCOPHAGE) 500 MG tablet Take 1 tablet (500 mg total) by mouth 2 (two) times daily with a meal. 180 tablet 3   ofloxacin (OCUFLOX) 0.3 % ophthalmic solution 1 drop daily as needed.     rizatriptan (MAXALT) 10 MG tablet TAKE 1 TABLET BY MOUTH AS  NEEDED FOR MIGRAINE. MAY  REPEAT IN 2 HOURS IF  NEEDED. 10 tablet 0   sertraline (ZOLOFT) 100 MG tablet TAKE 1 TABLET BY MOUTH  DAILY 90 tablet 3   zolpidem (AMBIEN) 10 MG tablet Take 1 tablet (10 mg total) by mouth at bedtime as needed for sleep. 30 tablet 0   No current facility-administered medications on file prior to visit.   Review of Systems  Constitutional:  Positive for chills, fever and malaise/fatigue.  HENT:  Positive for congestion and sinus pain. Negative for ear pain and sore throat.   Eyes:  Negative for blurred vision, discharge and redness.  Respiratory:  Positive for  cough, sputum production and wheezing. Negative for shortness of breath and stridor.   Cardiovascular:  Negative for chest pain, palpitations and leg swelling.  Gastrointestinal:  Negative for abdominal pain, diarrhea, nausea and vomiting.  Musculoskeletal:  Negative for myalgias.  Skin:  Negative for rash.  Neurological:  Positive for headaches. Negative for dizziness.     Observations/Objective: Patient appears well, in no distress, but is fatigued  Weight is baseline  No facial swelling  or asymmetry Hoarse voice with nasal congestion  No obvious tremor or mobility impairment Moving neck and UEs normally Able to hear the call well  No wheeze or shortness of breath during interview  Occasional dry sounding cough  Talkative and mentally sharp with no cognitive changes No skin changes on face or neck , no rash or pallor Affect is normal    Assessment and Plan:  Problem List Items Addressed This Visit       Respiratory   Viral URI with cough    Symptoms for 4 days  Significant fever Neg home covid test  Ordered flu and pcr covid swabs for [place] office today  Prednisone 40 mg taper for wheeze and congestion  Adv fluids/rest Symptom care  Discussed ER precautions  Pending results for further adv  Will continue to isolate until then            Follow Up Instructions: Drink fluids and rest  mucinex DM is good for cough and congestion  Nasal saline for congestion as needed  Tylenol for fever or pain or headache  Please alert [place] if symptoms worsen (if severe or short of breath please go to the ER)   Take the prednisone as directed for wheezing and nasal congestion   The office will call you to set up testing for covid and flu    I discussed the assessment and treatment plan with the patient. The patient was provided an opportunity to ask questions and all were answered. The patient agreed with the plan and demonstrated an understanding of the instructions.    The patient was advised to call back or seek an in-person evaluation if the symptoms worsen or if the condition fails to improve as anticipated.     Dominique Pardon, MD

## 2021-07-17 NOTE — Assessment & Plan Note (Addendum)
Symptoms for 4 days  Significant fever Neg home covid test  Ordered flu and pcr covid swabs for [name] office today  Prednisone 40 mg taper for wheeze and congestion  Adv fluids/rest Symptom care  Discussed ER precautions  Pending results for further adv  Will continue to isolate until then

## 2021-07-18 ENCOUNTER — Telehealth (INDEPENDENT_AMBULATORY_CARE_PROVIDER_SITE_OTHER): Payer: BC Managed Care – PPO | Admitting: Family

## 2021-07-18 ENCOUNTER — Encounter: Payer: Self-pay | Admitting: Family

## 2021-07-18 VITALS — Ht 62.75 in | Wt 235.0 lb

## 2021-07-18 DIAGNOSIS — F909 Attention-deficit hyperactivity disorder, unspecified type: Secondary | ICD-10-CM

## 2021-07-18 DIAGNOSIS — F331 Major depressive disorder, recurrent, moderate: Secondary | ICD-10-CM | POA: Diagnosis not present

## 2021-07-18 LAB — NOVEL CORONAVIRUS, NAA: SARS-CoV-2, NAA: NOT DETECTED

## 2021-07-18 LAB — SARS-COV-2, NAA 2 DAY TAT

## 2021-07-18 MED ORDER — AMPHETAMINE-DEXTROAMPHET ER 20 MG PO CP24: 20.0000 mg | ORAL_CAPSULE | ORAL | 0 refills | Status: DC

## 2021-07-18 NOTE — Progress Notes (Signed)
MyChart Video Visit    Virtual Visit via Video Note   This visit type was conducted due to national recommendations for restrictions regarding the COVID-19 Pandemic (e.g. social distancing) in an effort to limit this patient's exposure and mitigate transmission in our community. This patient is at least at moderate risk for complications without adequate follow up. This format is felt to be most appropriate for this patient at this time. Physical exam was limited by quality of the video and audio technology used for the visit. CMA was able to get the patient set up on a video visit.  Patient location: Home. Patient and provider in visit Provider location: Office  I discussed the limitations of evaluation and management by telemedicine and the availability of in person appointments. The patient expressed understanding and agreed to proceed.  Visit Date: 07/18/2021  Today's healthcare provider: Eugenia Pancoast, FNP     Subjective:    Patient ID: Dominique Nunez, female    DOB: [birth year], 56 y.o.   MRN: [id number]  Chief Complaint  Patient presents with   Medication Refill    HPI  Pt here for medication refill.   Adderall contract 9/21, overdue.  UD 01/03/20, was expected for what she was currently taking, however now overdue.   However pt pending covid pcr test and symptomatic so could not come into the office, hence video visit.   Adderall is 20 mg XR once daily. For work she works at [employer], she works in Therapist, art. The job requires a lot of focus because there are multiple tasks at a time and it really helps her to focus. She was diagnosed with ADD by a psychiatrist probably about for years ago'. She was recommended to see psychiatrist by Dr. Einar Pheasant back in May, but pt states she called one office that didn't have any openings at the time and she did not try any other offices.  Denies CP palp and or SOB.  Pt is sick with cough, fever intermittently, chest and nasal  congestion with some wheezing. Had virtual visit with Dr. Roque Lias tower yesterday, flu negative pending covid pcr test. Taking otc mucinex.  Pdmp reviewed last refill 05/24/21   She does also get Ambien and clonazepam from her OBGYN.   Past Medical History:  Diagnosis Date   Anxiety    Bipolar disorder ([place])    Depression    GERD (gastroesophageal reflux disease)    Headache    Heart murmur    History of fainting spells of unknown cause    Hypothyroidism    Kidney stones    Migraine    PONV (postoperative nausea and vomiting)    Thyroid disease     Past Surgical History:  Procedure Laterality Date   ABDOMINAL HYSTERECTOMY  2000   BREAST BIOPSY  1984   BREAST EXCISIONAL BIOPSY Left    CHOLECYSTECTOMY  2000   CYSTOSCOPY N/A 06/26/2016   Procedure: CYSTOSCOPY;  Surgeon: Arvella Nigh, MD;  Location: [facility];  Service: Gynecology;  Laterality: N/A;   INNER EAR SURGERY     LAPAROSCOPIC SALPINGO OOPHERECTOMY Bilateral 06/26/2016   Procedure: LAPAROSCOPIC SALPINGO OOPHORECTOMY,BILATERAL;  Surgeon: Arvella Nigh, MD;  Location: [facility];  Service: Gynecology;  Laterality: Bilateral;   TONSILLECTOMY AND ADENOIDECTOMY  1976   WISDOM TOOTH EXTRACTION      Family History  Problem Relation Age of Onset   Ovarian cancer Mother    Asthma Mother    COPD Mother    Breast cancer  Mother 51   Alcohol abuse Father    Kidney disease Father    Diabetes Father    Bipolar disorder Father    Anxiety disorder Father    Depression Father    Kidney disease Brother    Hepatitis C Brother    Alcohol abuse Brother    Bipolar disorder Brother    Anxiety disorder Brother    Depression Brother    Stroke Paternal Aunt    Breast cancer Paternal Aunt    Alcohol abuse Maternal Grandfather    Alcohol abuse Paternal Grandfather    Arthritis Paternal Grandfather    Heart disease Paternal Grandfather    Stroke Paternal Grandfather    Pancreatic cancer Paternal Grandfather     Social History    Socioeconomic History   Marital status: Married    Spouse name: Photographer   Number of children: 2   Years of education: Not on file   Highest education level: Bachelor's degree (e.g., BA, AB, BS)  Occupational History    Comment: full time  Tobacco Use   Smoking status: Never   Smokeless tobacco: Never  Vaping Use   Vaping Use: Never used  Substance and Sexual Activity   Alcohol use: Yes    Comment: rare   Drug use: No   Sexual activity: Yes    Birth control/protection: Surgical  Other Topics Concern   Not on file  Social History Narrative   10/13/19   From: the area   Living: with Husband Thayer Jew (1994)   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, 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   Dog: Optician, dispensing      Family: 2 adult children - Jarrett [place] and [place]       Enjoys: relax, walking, reading      Exercise: walking the dog and with husbant   Diet: 2 green veggies a night, meat, does like snacks      Safety   Seat belts: Yes    Guns: Yes  and secure   Safe in relationships: Yes    Social Determinants of Radio broadcast assistant Strain: Not on file  Food Insecurity: Not on file  Transportation Needs: Not on file  Physical Activity: Not on file  Stress: Not on file  Social Connections: Not on file  Intimate Partner Violence: Not on file    Outpatient Medications Prior to Visit  Medication Sig Dispense Refill   acetaminophen (TYLENOL) 650 MG CR tablet Take 1,300 mg by mouth 2 (two) times daily as needed for pain.     ARIPiprazole (ABILIFY) 10 MG tablet TAKE 1 TABLET(10 MG) BY MOUTH DAILY 90 tablet 1   buPROPion (WELLBUTRIN XL) 300 MG 24 hr tablet TAKE 1 TABLET BY MOUTH  DAILY 90 tablet 3   cetirizine (ZYRTEC) 10 MG tablet Take 10 mg by mouth daily.     clonazePAM (KLONOPIN) 0.5 MG disintegrating tablet Take 0.5 mg by mouth daily as needed.     DOTTI 0.1 MG/24HR patch Place onto the skin 2 (two) times a week.     ibuprofen (ADVIL,MOTRIN) 200 MG tablet Take 800 mg by mouth daily as needed  for moderate pain.     Ivermectin (SOOLANTRA) 1 % CREA Soolantra 1 % topical cream     levothyroxine (SYNTHROID) 88 MCG tablet Take 1 tablet (88 mcg total) by mouth daily. 90 tablet 3   metFORMIN (GLUCOPHAGE) 500 MG tablet Take 1 tablet (500 mg total) by mouth 2 (two) times daily with a meal. 180 tablet 3  ofloxacin (OCUFLOX) 0.3 % ophthalmic solution 1 drop daily as needed.     predniSONE (DELTASONE) 10 MG tablet Take 4 pills once daily by mouth for 3 days, then 3 pills daily for 3 days, then 2 pills daily for 3 days then 1 pill daily for 3 days then stop 30 tablet 0   rizatriptan (MAXALT) 10 MG tablet TAKE 1 TABLET BY MOUTH AS  NEEDED FOR MIGRAINE. MAY  REPEAT IN 2 HOURS IF  NEEDED. 10 tablet 0   sertraline (ZOLOFT) 100 MG tablet TAKE 1 TABLET BY MOUTH  DAILY 90 tablet 3   zolpidem (AMBIEN) 10 MG tablet Take 1 tablet (10 mg total) by mouth at bedtime as needed for sleep. 30 tablet 0   amphetamine-dextroamphetamine (ADDERALL XR) 20 MG 24 hr capsule Take 1 capsule (20 mg total) by mouth every morning. 30 capsule 0   amphetamine-dextroamphetamine (ADDERALL XR) 20 MG 24 hr capsule Take 1 capsule (20 mg total) by mouth every morning. 30 capsule 0   amphetamine-dextroamphetamine (ADDERALL XR) 20 MG 24 hr capsule Take 1 capsule (20 mg total) by mouth every morning. 30 capsule 0   amphetamine-dextroamphetamine (ADDERALL XR) 20 MG 24 hr capsule Take 1 capsule (20 mg total) by mouth every morning. 30 capsule 0   No facility-administered medications prior to visit.    No Known Allergies  Review of Systems  Constitutional:  Positive for chills and fever.  HENT:  Positive for congestion. Negative for ear pain and sore throat.   Respiratory:  Positive for cough and wheezing. Negative for shortness of breath.   Cardiovascular:  Negative for chest pain and palpitations.  Psychiatric/Behavioral:  Positive for decreased concentration (improved with adderall) and sleep disturbance (takes [place]). Negative  for suicidal ideas. The patient is nervous/anxious (clonazepam prn).       Objective:    Physical Exam Constitutional:      General: She is not in acute distress.    Appearance: Normal appearance. She is obese. She is not ill-appearing, toxic-appearing or diaphoretic.  HENT:     Head: Normocephalic.  Pulmonary:     Effort: Pulmonary effort is normal.  Neurological:     General: No focal deficit present.     Mental Status: She is alert and oriented to person, place, and time.  Psychiatric:        Mood and Affect: Mood normal.        Behavior: Behavior normal.        Thought Content: Thought content normal.        Judgment: Judgment normal.    Ht 5' 2.75" (1.594 m)    Wt 235 lb (106.6 kg)    BMI 41.96 kg/m  Wt Readings from Last 3 Encounters:  07/18/21 235 lb (106.6 kg)  04/01/21 236 lb 2 oz (107.1 kg)  12/11/20 242 lb (109.8 kg)       Assessment & Plan:   Problem List Items Addressed This Visit       Other   Attention deficit disorder (ADD)    UDS and controlled substance contract overdue however due to her potentially being COVID-positive we are unable to obtain the appropriate paperwork as well as her urine drug screen today as she cannot complete this visit in the office.  I have had a long discussion with the patient that she really needs to establish care with a psychiatrist for ongoing maintenance of this medication as well as her psychiatric medications as they are very complex and also to  avoid polypharmacy. Referral placed for psychiatrist however advised pt to call back of insurance card as well to obtain available psychiatrist lists so she can call as well. Also informed pt I will provide #30 days only of her adderall and no refills.      Relevant Medications   amphetamine-dextroamphetamine (ADDERALL XR) 20 MG 24 hr capsule   Other Relevant Orders   Ambulatory referral to Psychiatry   MDD (major depressive disorder), recurrent episode, moderate ([place]) - Primary     Discussed with patient due to complexity of medication and risk high risk of polypharmacy advised that patient follow-up and/or refer for consult with psychiatry. patient made aware of this and plans to make appointment with psychiatry.      Relevant Orders   Ambulatory referral to Psychiatry    I am having Dominique Nunez maintain her zolpidem, acetaminophen, ibuprofen, cetirizine, Soolantra, ofloxacin, clonazePAM, Dotti, metFORMIN, sertraline, buPROPion, levothyroxine, ARIPiprazole, rizatriptan, predniSONE, and amphetamine-dextroamphetamine.  Meds ordered this encounter  Medications   amphetamine-dextroamphetamine (ADDERALL XR) 20 MG 24 hr capsule    Sig: Take 1 capsule (20 mg total) by mouth every morning.    Dispense:  30 capsule    Refill:  0    Order Specific Question:   Supervising Provider    Answer:   BEDSOLE, AMY E [[id number]]    I discussed the assessment and treatment plan with the patient. The patient was provided an opportunity to ask questions and all were answered. The patient agreed with the plan and demonstrated an understanding of the instructions.   The patient was advised to call back or seek an in-person evaluation if the symptoms worsen or if the condition fails to improve as anticipated.  I provided 19 minutes of face-to-face time during this encounter.   Eugenia Pancoast, [place] [facility] [place] [phone] (phone) [phone] (fax)  [place]

## 2021-07-18 NOTE — Assessment & Plan Note (Signed)
Discussed with patient due to complexity of medication and risk high risk of polypharmacy advised that patient follow-up and/or refer for consult with psychiatry. patient made aware of this and plans to make appointment with psychiatry.

## 2021-07-18 NOTE — Assessment & Plan Note (Addendum)
UDS and controlled substance contract overdue however due to her potentially being COVID-positive we are unable to obtain the appropriate paperwork as well as her urine drug screen today as she cannot complete this visit in the office.  I have had a long discussion with the patient that she really needs to establish care with a psychiatrist for ongoing maintenance of this medication as well as her psychiatric medications as they are very complex and also to avoid polypharmacy. Referral placed for psychiatrist however advised pt to call back of insurance card as well to obtain available psychiatrist lists so she can call as well. Also informed pt I will provide #30 days only of her adderall and no refills.

## 2021-07-19 ENCOUNTER — Encounter: Admission: RE | Payer: Self-pay | Source: Ambulatory Visit

## 2021-07-19 ENCOUNTER — Ambulatory Visit
Admission: RE | Admit: 2021-07-19 | Payer: BC Managed Care – PPO | Source: Ambulatory Visit | Admitting: Gastroenterology

## 2021-07-19 SURGERY — COLONOSCOPY WITH PROPOFOL
Anesthesia: General

## 2021-07-24 ENCOUNTER — Telehealth: Payer: Self-pay | Admitting: Family Medicine

## 2021-07-24 NOTE — Telephone Encounter (Signed)
Dominique Nunez called in and stated that she is still not feeling good. She still has all of her symptoms wanted to know if its something else she can take.

## 2021-07-24 NOTE — Telephone Encounter (Signed)
Called the patient and she is still having a cough, fever, chills, body aches, and she states that medication is helping and would like to try something different. Please advise.

## 2021-07-25 ENCOUNTER — Ambulatory Visit: Payer: BC Managed Care – PPO | Admitting: Family

## 2021-07-25 ENCOUNTER — Other Ambulatory Visit: Payer: Self-pay

## 2021-07-25 ENCOUNTER — Encounter: Payer: Self-pay | Admitting: Family

## 2021-07-25 VITALS — BP 108/70 | HR 77 | Temp 97.4°F | Ht 62.75 in | Wt 237.0 lb

## 2021-07-25 DIAGNOSIS — R0602 Shortness of breath: Secondary | ICD-10-CM | POA: Diagnosis not present

## 2021-07-25 DIAGNOSIS — J0110 Acute frontal sinusitis, unspecified: Secondary | ICD-10-CM | POA: Diagnosis not present

## 2021-07-25 MED ORDER — AMOXICILLIN-POT CLAVULANATE 875-125 MG PO TABS: 1.0000 | ORAL_TABLET | Freq: Two times a day (BID) | ORAL | 0 refills | Status: AC

## 2021-07-25 MED ORDER — ALBUTEROL SULFATE HFA 108 (90 BASE) MCG/ACT IN AERS
2.0000 | INHALATION_SPRAY | Freq: Four times a day (QID) | RESPIRATORY_TRACT | 0 refills | Status: DC | PRN
Start: 2021-07-25 — End: 2021-09-30

## 2021-07-25 MED ORDER — FLUTICASONE PROPIONATE 50 MCG/ACT NA SUSP: 1.0000 | Freq: Two times a day (BID) | NASAL | 5 refills | Status: DC

## 2021-07-25 NOTE — Telephone Encounter (Signed)
Ok great thank you.

## 2021-07-25 NOTE — Progress Notes (Signed)
Established Patient Office Visit  Subjective:  Patient ID: Dominique Nunez, female    DOB: 1965-10-06  Age: 56 y.o. MRN: [id number]  CC:  Chief Complaint  Patient presents with   URI    Started 2 weeks ago with fever, chills, cough. Tested for Flu and Covid 1 week ago and was negative. Still coughing and has some wheezing.    HPI Dominique Nunez is here today for f/u on URI symptoms.  She had tested for covid after visit and was negative, PCR was negative. Flu was also negative. She was seen by [facility] virtually 07/17/21, given prednisone taper with no real improvement in symptoms. Today with c/o wheezing, chest congestion, productive cough with green sputum. No sore throat, bil ears are itchy. Recent fever up to 102 F, this was just last night.   Does feel sob with some of the coughing fits.   Symptoms had started 07/14/21.  Past Medical History:  Diagnosis Date   Anxiety    Bipolar disorder ([place])    Depression    GERD (gastroesophageal reflux disease)    Headache    Heart murmur    History of fainting spells of unknown cause    Hypothyroidism    Kidney stones    Migraine    PONV (postoperative nausea and vomiting)    Thyroid disease     Past Surgical History:  Procedure Laterality Date   ABDOMINAL HYSTERECTOMY  2000   BREAST BIOPSY  1984   BREAST EXCISIONAL BIOPSY Left    CHOLECYSTECTOMY  2000   CYSTOSCOPY N/A 06/26/2016   Procedure: CYSTOSCOPY;  Surgeon: Arvella Nigh, MD;  Location: [facility];  Service: Gynecology;  Laterality: N/A;   INNER EAR SURGERY     LAPAROSCOPIC SALPINGO OOPHERECTOMY Bilateral 06/26/2016   Procedure: LAPAROSCOPIC SALPINGO OOPHORECTOMY,BILATERAL;  Surgeon: Arvella Nigh, MD;  Location: [facility];  Service: Gynecology;  Laterality: Bilateral;   TONSILLECTOMY AND ADENOIDECTOMY  1976   WISDOM TOOTH EXTRACTION      Family History  Problem Relation Age of Onset   Ovarian cancer Mother    Asthma Mother    COPD Mother    Breast cancer Mother 30    Alcohol abuse Father    Kidney disease Father    Diabetes Father    Bipolar disorder Father    Anxiety disorder Father    Depression Father    Kidney disease Brother    Hepatitis C Brother    Alcohol abuse Brother    Bipolar disorder Brother    Anxiety disorder Brother    Depression Brother    Stroke Paternal Aunt    Breast cancer Paternal Aunt    Alcohol abuse Maternal Grandfather    Alcohol abuse Paternal Grandfather    Arthritis Paternal Grandfather    Heart disease Paternal Grandfather    Stroke Paternal Grandfather    Pancreatic cancer Paternal Grandfather     Social History   Socioeconomic History   Marital status: Married    Spouse name: Photographer   Number of children: 2   Years of education: Not on file   Highest education level: Bachelor's degree (e.g., BA, AB, BS)  Occupational History    Comment: full time  Tobacco Use   Smoking status: Never   Smokeless tobacco: Never  Vaping Use   Vaping Use: Never used  Substance and Sexual Activity   Alcohol use: Yes    Comment: rare   Drug use: No   Sexual activity: Yes  Birth control/protection: Surgical  Other Topics Concern   Not on file  Social History Narrative   10/13/19   From: the area   Living: with Husband Thayer Jew (1994)   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, 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   Dog: Optician, dispensing      Family: 2 adult children - Jarrett [place] and [place]       Enjoys: relax, walking, reading      Exercise: walking the dog and with husbant   Diet: 2 green veggies a night, meat, does like snacks      Safety   Seat belts: Yes    Guns: Yes  and secure   Safe in relationships: Yes    Social Determinants of Radio broadcast assistant Strain: Not on file  Food Insecurity: Not on file  Transportation Needs: Not on file  Physical Activity: Not on file  Stress: Not on file  Social Connections: Not on file  Intimate Partner Violence: Not on file    Outpatient Medications Prior to Visit  Medication Sig  Dispense Refill   acetaminophen (TYLENOL) 650 MG CR tablet Take 1,300 mg by mouth 2 (two) times daily as needed for pain.     amphetamine-dextroamphetamine (ADDERALL XR) 20 MG 24 hr capsule Take 1 capsule (20 mg total) by mouth every morning. 30 capsule 0   ARIPiprazole (ABILIFY) 10 MG tablet TAKE 1 TABLET(10 MG) BY MOUTH DAILY 90 tablet 1   buPROPion (WELLBUTRIN XL) 300 MG 24 hr tablet TAKE 1 TABLET BY MOUTH  DAILY 90 tablet 3   cetirizine (ZYRTEC) 10 MG tablet Take 10 mg by mouth daily.     clonazePAM (KLONOPIN) 0.5 MG disintegrating tablet Take 0.5 mg by mouth daily as needed.     DOTTI 0.1 MG/24HR patch Place onto the skin 2 (two) times a week.     ibuprofen (ADVIL,MOTRIN) 200 MG tablet Take 800 mg by mouth daily as needed for moderate pain.     levothyroxine (SYNTHROID) 88 MCG tablet Take 1 tablet (88 mcg total) by mouth daily. 90 tablet 3   metFORMIN (GLUCOPHAGE) 500 MG tablet Take 1 tablet (500 mg total) by mouth 2 (two) times daily with a meal. 180 tablet 3   ofloxacin (OCUFLOX) 0.3 % ophthalmic solution 1 drop daily as needed.     rizatriptan (MAXALT) 10 MG tablet TAKE 1 TABLET BY MOUTH AS  NEEDED FOR MIGRAINE. MAY  REPEAT IN 2 HOURS IF  NEEDED. 10 tablet 0   sertraline (ZOLOFT) 100 MG tablet TAKE 1 TABLET BY MOUTH  DAILY 90 tablet 3   zolpidem (AMBIEN) 10 MG tablet Take 1 tablet (10 mg total) by mouth at bedtime as needed for sleep. 30 tablet 0   predniSONE (DELTASONE) 10 MG tablet Take 4 pills once daily by mouth for 3 days, then 3 pills daily for 3 days, then 2 pills daily for 3 days then 1 pill daily for 3 days then stop 30 tablet 0   Ivermectin (SOOLANTRA) 1 % CREA Soolantra 1 % topical cream (Patient not taking: Reported on 07/25/2021)     No facility-administered medications prior to visit.    No Known Allergies  ROS Review of Systems  Constitutional:  Positive for fatigue and fever. Negative for chills.  HENT:  Positive for congestion, postnasal drip, sinus pressure, sinus  pain and sore throat. Negative for ear discharge and ear pain.   Respiratory:  Positive for cough and shortness of breath. Negative for wheezing.   Cardiovascular:  Negative for  chest pain and palpitations.  Gastrointestinal:  Negative for abdominal pain.     Objective:    Physical Exam Vitals reviewed.  Constitutional:      General: She is not in acute distress.    Appearance: Normal appearance. She is obese. She is not ill-appearing, toxic-appearing or diaphoretic.  HENT:     Head: Normocephalic.     Comments: Sinus tenderness on palpation maxillary and ethmoid    Right Ear: Tympanic membrane normal.     Left Ear: Tympanic membrane normal.     Nose: Nose normal.     Mouth/Throat:     Mouth: Mucous membranes are moist.     Pharynx: Posterior oropharyngeal erythema present.  Eyes:     Pupils: Pupils are equal, round, and reactive to light.  Cardiovascular:     Rate and Rhythm: Normal rate and regular rhythm.  Pulmonary:     Effort: Pulmonary effort is normal.     Breath sounds: Normal breath sounds.  Neurological:     Mental Status: She is alert.    BP 108/70 (BP Location: Right Arm, Patient Position: Sitting, Cuff Size: Large)    Pulse 77    Temp (!) 97.4 F (36.3 C)    Ht 5' 2.75" (1.594 m)    Wt 237 lb (107.5 kg)    SpO2 98%    BMI 42.32 kg/m  Wt Readings from Last 3 Encounters:  07/25/21 237 lb (107.5 kg)  07/18/21 235 lb (106.6 kg)  04/01/21 236 lb 2 oz (107.1 kg)     Health Maintenance Due  Topic Date Due   COVID-19 Vaccine (1) Never done   COLONOSCOPY (Pts 45-47yr Insurance coverage will need to be confirmed)  Never done   Zoster Vaccines- Shingrix (1 of 2) Never done    There are no preventive care reminders to display for this patient.  Lab Results  Component Value Date   TSH 2.210 04/01/2021   Lab Results  Component Value Date   WBC 9.2 04/01/2021   HGB 13.6 04/01/2021   HCT 41.6 04/01/2021   MCV 88 04/01/2021   PLT 373 04/01/2021   Lab  Results  Component Value Date   NA 138 04/01/2021   K 4.7 04/01/2021   CO2 24 04/01/2021   GLUCOSE 112 (H) 04/01/2021   BUN 12 04/01/2021   CREATININE 0.76 04/01/2021   BILITOT 0.3 04/01/2021   ALKPHOS 92 04/01/2021   AST 11 04/01/2021   ALT 12 04/01/2021   PROT 6.4 04/01/2021   ALBUMIN 4.2 04/01/2021   CALCIUM 9.5 04/01/2021   EGFR 94 04/01/2021   Lab Results  Component Value Date   HGBA1C 5.9 (H) 04/01/2021      Assessment & Plan:   Problem List Items Addressed This Visit   None Visit Diagnoses     Shortness of breath    -  Primary   Relevant Medications   albuterol (VENTOLIN HFA) 108 (90 Base) MCG/ACT inhaler   Acute non-recurrent frontal sinusitis       Relevant Medications   amoxicillin-clavulanate (AUGMENTIN) 875-125 MG tablet   fluticasone (FLONASE) 50 MCG/ACT nasal spray       Meds ordered this encounter  Medications   albuterol (VENTOLIN HFA) 108 (90 Base) MCG/ACT inhaler    Sig: Inhale 2 puffs into the lungs every 6 (six) hours as needed for wheezing or shortness of breath.    Dispense:  8 g    Refill:  0    Order Specific Question:  Supervising Provider    Answer:   Diona Browner, AMY E [[id number]]   amoxicillin-clavulanate (AUGMENTIN) 875-125 MG tablet    Sig: Take 1 tablet by mouth 2 (two) times daily for 10 days.    Dispense:  20 tablet    Refill:  0    Order Specific Question:   Supervising Provider    Answer:   BEDSOLE, AMY E [[id number]]   fluticasone (FLONASE) 50 MCG/ACT nasal spray    Sig: Place 1 spray into both nostrils 2 (two) times daily.    Dispense:  16 g    Refill:  5    Order Specific Question:   Supervising Provider    Answer:   BEDSOLE, AMY E [[id number]]    Follow-up: Return if symptoms worsen or fail to improve.    Eugenia Pancoast, FNP

## 2021-07-25 NOTE — Patient Instructions (Signed)
Start prescription of augmentin 875/125 mg and take as prescribed.  Tylenol/ibuprofen ok for sinus pain as needed Recommend you start flonase daily as well.   Increase oral fluids. Ok to continue with humidifers and hot steamy showers as discussed during visit.  It was a pleasure speaking with you today, I hope you start feeling better soon.  Regards,   Eugenia Pancoast

## 2021-08-02 ENCOUNTER — Other Ambulatory Visit: Payer: Self-pay | Admitting: Family Medicine

## 2021-08-05 ENCOUNTER — Other Ambulatory Visit: Payer: Self-pay | Admitting: Family Medicine

## 2021-08-05 DIAGNOSIS — R7303 Prediabetes: Secondary | ICD-10-CM

## 2021-08-05 DIAGNOSIS — Z1231 Encounter for screening mammogram for malignant neoplasm of breast: Secondary | ICD-10-CM | POA: Diagnosis not present

## 2021-08-05 DIAGNOSIS — Z01419 Encounter for gynecological examination (general) (routine) without abnormal findings: Secondary | ICD-10-CM | POA: Diagnosis not present

## 2021-08-05 DIAGNOSIS — Z6841 Body Mass Index (BMI) 40.0 and over, adult: Secondary | ICD-10-CM | POA: Diagnosis not present

## 2021-08-06 ENCOUNTER — Other Ambulatory Visit: Payer: Self-pay | Admitting: Obstetrics and Gynecology

## 2021-08-06 DIAGNOSIS — Z803 Family history of malignant neoplasm of breast: Secondary | ICD-10-CM

## 2021-08-08 ENCOUNTER — Telehealth: Payer: Self-pay

## 2021-08-08 NOTE — Telephone Encounter (Signed)
Returned patients call. LVM to call office back. 

## 2021-08-15 ENCOUNTER — Other Ambulatory Visit: Payer: Self-pay | Admitting: Family

## 2021-08-15 DIAGNOSIS — R0602 Shortness of breath: Secondary | ICD-10-CM

## 2021-08-18 ENCOUNTER — Other Ambulatory Visit: Payer: Self-pay

## 2021-08-18 ENCOUNTER — Ambulatory Visit
Admission: RE | Admit: 2021-08-18 | Discharge: 2021-08-18 | Disposition: A | Discharge status: Home / Self Care | Payer: BC Managed Care – PPO | Source: Ambulatory Visit | Attending: Obstetrics and Gynecology | Admitting: Obstetrics and Gynecology

## 2021-08-18 DIAGNOSIS — Z803 Family history of malignant neoplasm of breast: Secondary | ICD-10-CM | POA: Diagnosis not present

## 2021-08-18 MED ORDER — GADOBUTROL 1 MMOL/ML IV SOLN
10.0000 mL | Freq: Once | INTRAVENOUS | Status: AC | PRN
  Administered 2021-08-18: 10 mL via INTRAVENOUS

## 2021-08-19 NOTE — Telephone Encounter (Signed)
Mychart sent to pt.

## 2021-08-20 NOTE — Telephone Encounter (Signed)
See response from mychart message from patient, patient did not need this refilled, per patient does not use this that much

## 2021-08-28 ENCOUNTER — Other Ambulatory Visit: Payer: Self-pay

## 2021-08-28 ENCOUNTER — Telehealth: Payer: Self-pay

## 2021-08-28 DIAGNOSIS — L570 Actinic keratosis: Secondary | ICD-10-CM | POA: Diagnosis not present

## 2021-08-28 DIAGNOSIS — Z1283 Encounter for screening for malignant neoplasm of skin: Secondary | ICD-10-CM | POA: Diagnosis not present

## 2021-08-28 DIAGNOSIS — L718 Other rosacea: Secondary | ICD-10-CM | POA: Diagnosis not present

## 2021-08-28 DIAGNOSIS — D485 Neoplasm of uncertain behavior of skin: Secondary | ICD-10-CM | POA: Diagnosis not present

## 2021-08-28 DIAGNOSIS — Z1211 Encounter for screening for malignant neoplasm of colon: Secondary | ICD-10-CM

## 2021-08-28 DIAGNOSIS — Z85828 Personal history of other malignant neoplasm of skin: Secondary | ICD-10-CM | POA: Diagnosis not present

## 2021-08-28 DIAGNOSIS — C44319 Basal cell carcinoma of skin of other parts of face: Secondary | ICD-10-CM | POA: Diagnosis not present

## 2021-08-28 DIAGNOSIS — D2262 Melanocytic nevi of left upper limb, including shoulder: Secondary | ICD-10-CM | POA: Diagnosis not present

## 2021-08-28 NOTE — Progress Notes (Signed)
PATIENT ALREADY HAS PREP

## 2021-08-28 NOTE — Progress Notes (Signed)
Gastroenterology Pre-Procedure Review  Request Date: 10/04/2021 Requesting Physician: Dr. Vicente Males  PATIENT REVIEW QUESTIONS: The patient responded to the following health history questions as indicated:    1. Are you having any GI issues? no 2. Do you have a personal history of Polyps? no 3. Do you have a family history of Colon Cancer or Polyps? yes (MOM      POLYPS) 4. Diabetes Mellitus? no 5. Joint replacements in the past 12 months?no 6. Major health problems in the past 3 months?no 7. Any artificial heart valves, MVP, or defibrillator?no    MEDICATIONS & ALLERGIES:    Patient reports the following regarding taking any anticoagulation/antiplatelet therapy:   Plavix, Coumadin, Eliquis, Xarelto, Lovenox, Pradaxa, Brilinta, or Effient? no Aspirin? no  Patient confirms/reports the following medications:  Current Outpatient Medications  Medication Sig Dispense Refill   acetaminophen (TYLENOL) 650 MG CR tablet Take 1,300 mg by mouth 2 (two) times daily as needed for pain.     albuterol (VENTOLIN HFA) 108 (90 Base) MCG/ACT inhaler Inhale 2 puffs into the lungs every 6 (six) hours as needed for wheezing or shortness of breath. 8 g 0   amphetamine-dextroamphetamine (ADDERALL XR) 20 MG 24 hr capsule Take 1 capsule (20 mg total) by mouth every morning. 30 capsule 0   ARIPiprazole (ABILIFY) 10 MG tablet TAKE 1 TABLET(10 MG) BY MOUTH DAILY 90 tablet 1   buPROPion (WELLBUTRIN XL) 300 MG 24 hr tablet TAKE 1 TABLET BY MOUTH  DAILY 90 tablet 3   cetirizine (ZYRTEC) 10 MG tablet Take 10 mg by mouth daily.     clonazePAM (KLONOPIN) 0.5 MG disintegrating tablet Take 0.5 mg by mouth daily as needed.     DOTTI 0.1 MG/24HR patch Place onto the skin 2 (two) times a week.     fluticasone (FLONASE) 50 MCG/ACT nasal spray Place 1 spray into both nostrils 2 (two) times daily. 16 g 5   ibuprofen (ADVIL,MOTRIN) 200 MG tablet Take 800 mg by mouth daily as needed for moderate pain.     Ivermectin (SOOLANTRA) 1 %  CREA Soolantra 1 % topical cream     levothyroxine (SYNTHROID) 88 MCG tablet Take 1 tablet (88 mcg total) by mouth daily. 90 tablet 3   metFORMIN (GLUCOPHAGE) 500 MG tablet TAKE 1 TABLET(500 MG) BY MOUTH TWICE DAILY WITH A MEAL 180 tablet 2   ofloxacin (OCUFLOX) 0.3 % ophthalmic solution 1 drop daily as needed.     rizatriptan (MAXALT) 10 MG tablet TAKE 1 TABLET BY MOUTH AS  NEEDED FOR MIGRAINE. MAY  REPEAT IN 2 HOURS IF  NEEDED. 10 tablet 0   sertraline (ZOLOFT) 100 MG tablet TAKE 1 TABLET BY MOUTH  DAILY 90 tablet 2   zolpidem (AMBIEN) 10 MG tablet Take 1 tablet (10 mg total) by mouth at bedtime as needed for sleep. 30 tablet 0   No current facility-administered medications for this visit.    Patient confirms/reports the following allergies:  No Known Allergies  No orders of the defined types were placed in this encounter.   AUTHORIZATION INFORMATION Primary Insurance: 1D#: Group #:  Secondary Insurance: 1D#: Group #:  SCHEDULE INFORMATION: Date: 10/04/2021 Time: Location:[facility]

## 2021-08-28 NOTE — Telephone Encounter (Signed)
Patient left a voicemail and states she has called multiply times the last 2 weeks and wants to reschedule her colonoscopy

## 2021-09-10 ENCOUNTER — Telehealth: Payer: Self-pay | Admitting: Family Medicine

## 2021-09-10 NOTE — Telephone Encounter (Signed)
°  Encourage patient to contact the pharmacy for refills or they can request refills through [place]:  Please schedule appointment if longer than 1 year  NEXT APPOINTMENT DATE:  MEDICATION: adderall   Is the patient out of medication? yes  PHARMACY: [facility]- [address]  Let patient know to contact pharmacy at the end of the day to make sure medication is ready.  Please notify patient to allow 48-72 hours to process  CLINICAL FILLS OUT ALL BELOW:   LAST REFILL:  QTY:  REFILL DATE:    OTHER COMMENTS:    Okay for refill?  Please advise

## 2021-09-11 DIAGNOSIS — C44319 Basal cell carcinoma of skin of other parts of face: Secondary | ICD-10-CM | POA: Diagnosis not present

## 2021-09-19 ENCOUNTER — Other Ambulatory Visit: Payer: Self-pay | Admitting: Family Medicine

## 2021-09-30 ENCOUNTER — Ambulatory Visit: Payer: BC Managed Care – PPO | Admitting: Family Medicine

## 2021-09-30 ENCOUNTER — Other Ambulatory Visit: Payer: Self-pay

## 2021-09-30 ENCOUNTER — Encounter: Payer: Self-pay | Admitting: Family Medicine

## 2021-09-30 DIAGNOSIS — F909 Attention-deficit hyperactivity disorder, unspecified type: Secondary | ICD-10-CM

## 2021-09-30 DIAGNOSIS — F325 Major depressive disorder, single episode, in full remission: Secondary | ICD-10-CM

## 2021-09-30 MED ORDER — AMPHETAMINE-DEXTROAMPHET ER 20 MG PO CP24: 20.0000 mg | ORAL_CAPSULE | ORAL | 0 refills | Status: DC

## 2021-09-30 NOTE — Patient Instructions (Signed)
Check out these psychiatry offices  ? ?[facility] [phone] ?[facility] [phone] ?[facility] [phone] ?[facility] [phone]  ?[facility] [phone] ?[facility] [phone] ?[facility] [phone] ?

## 2021-09-30 NOTE — Assessment & Plan Note (Signed)
Stable on abilify and wellbutrin. Cont medication. Psych numbers provided.  ?

## 2021-09-30 NOTE — Progress Notes (Signed)
? ?Subjective:  ? ?  ?Dominique Nunez is a 56 y.o. female presenting for Medication Refill ?  ? ? ?Medication Refill ? ? ?#ADD ?- not taking everyday ?- only when she notices more scattered thoughts ?- when she needs more focused concentration ?- only taking 1-2 times per week  ?- 2 weeks since she last took it  ? ?CBD gummy used once recently  ? ? ?Review of Systems ? ? ?Social History  ? ?Tobacco Use  ?Smoking Status Never  ?Smokeless Tobacco Never  ? ? ? ?   ?Objective:  ?  ?BP Readings from Last 3 Encounters:  ?09/30/21 124/86  ?07/25/21 108/70  ?04/01/21 108/70  ? ?Wt Readings from Last 3 Encounters:  ?09/30/21 245 lb 9.6 oz (111.4 kg)  ?07/25/21 237 lb (107.5 kg)  ?07/18/21 235 lb (106.6 kg)  ? ? ?BP 124/86 (BP Location: Right Arm, Patient Position: Sitting, Cuff Size: Large)   Pulse 81   Ht 5' 2.75" (1.594 m)   Wt 245 lb 9.6 oz (111.4 kg)   SpO2 99%   BMI 43.85 kg/m?  ? ? ?Physical Exam ?Constitutional:   ?   General: She is not in acute distress. ?   Appearance: She is well-developed. She is not diaphoretic.  ?HENT:  ?   Right Ear: External ear normal.  ?   Left Ear: External ear normal.  ?Eyes:  ?   Conjunctiva/sclera: Conjunctivae normal.  ?Cardiovascular:  ?   Rate and Rhythm: Normal rate and regular rhythm.  ?   Heart sounds: No murmur heard. ?Pulmonary:  ?   Effort: Pulmonary effort is normal. No respiratory distress.  ?   Breath sounds: Normal breath sounds. No wheezing.  ?Musculoskeletal:  ?   Cervical back: Neck supple.  ?Skin: ?   General: Skin is warm and dry.  ?   Capillary Refill: Capillary refill takes less than 2 seconds.  ?Neurological:  ?   Mental Status: She is alert. Mental status is at baseline.  ?Psychiatric:     ?   Mood and Affect: Mood normal.     ?   Behavior: Behavior normal.  ? ? ? ? ? ?   ?Assessment & Plan:  ? ?Problem List Items Addressed This Visit   ? ?  ? Other  ? Attention deficit disorder (ADD)  ?  Stable on current regimen. Does not take adderall daily. Discussed that  with clonazepam and ambien which are filled by OB/GYN would re reasonable to see psychiatry to simplify her regimen. She was denied due to no show from one psychiatrist. Phone numbers provided. UDS today. Contract updated today. Discussed that I would not fill her ambien/clonazepam if her OB stops prescribing so psychiatry may be more necessary in the future. Cont adderall XR 20 mg ?  ?  ? Relevant Medications  ? amphetamine-dextroamphetamine (ADDERALL XR) 20 MG 24 hr capsule  ? amphetamine-dextroamphetamine (ADDERALL XR) 20 MG 24 hr capsule (Start on 10/31/2021)  ? amphetamine-dextroamphetamine (ADDERALL XR) 20 MG 24 hr capsule (Start on 11/30/2021)  ? Other Relevant Orders  ? Drug Monitoring Panel [id number] , Urine  ? Major depression in remission [facility])  ?  Stable on abilify and wellbutrin. Cont medication. Psych numbers provided.  ?  ?  ? ? ? ?Return in about 3 months (around 12/31/2021) for for refill or 6 months for annual . ? ?Lesleigh Noe, MD ? ?This visit occurred during the SARS-CoV-2 public health emergency.  Safety protocols were in  place, including screening questions prior to the visit, additional usage of staff PPE, and extensive cleaning of exam room while observing appropriate contact time as indicated for disinfecting solutions.  ? ?

## 2021-09-30 NOTE — Assessment & Plan Note (Signed)
Stable on current regimen. Does not take adderall daily. Discussed that with clonazepam and ambien which are filled by OB/GYN would re reasonable to see psychiatry to simplify her regimen. She was denied due to no show from one psychiatrist. Phone numbers provided. UDS today. Contract updated today. Discussed that I would not fill her ambien/clonazepam if her OB stops prescribing so psychiatry may be more necessary in the future. Cont adderall XR 20 mg ?

## 2021-10-02 LAB — DM TEMPLATE

## 2021-10-02 LAB — DRUG MONITORING PANEL 376104, URINE
Amphetamines: NEGATIVE ng/mL (ref <500)
Barbiturates: NEGATIVE ng/mL (ref <300)
Benzodiazepines: NEGATIVE ng/mL (ref <100)
Cocaine Metabolite: NEGATIVE ng/mL (ref <150)
Desmethyltramadol: NEGATIVE ng/mL (ref <100)
Opiates: NEGATIVE ng/mL (ref <100)
Oxycodone: NEGATIVE ng/mL (ref <100)
Tramadol: NEGATIVE ng/mL (ref <100)

## 2021-10-03 ENCOUNTER — Encounter: Payer: Self-pay | Admitting: Gastroenterology

## 2021-10-04 ENCOUNTER — Ambulatory Visit
Admission: RE | Admit: 2021-10-04 | Discharge: 2021-10-04 | Disposition: A | Discharge status: Home / Self Care | Payer: BC Managed Care – PPO | Source: Ambulatory Visit | Attending: Gastroenterology | Admitting: Gastroenterology

## 2021-10-04 ENCOUNTER — Encounter: Payer: Self-pay | Admitting: Gastroenterology

## 2021-10-04 ENCOUNTER — Encounter
Admission: RE | Disposition: A | Discharge status: Home / Self Care | Payer: Self-pay | Source: Ambulatory Visit | Attending: Gastroenterology

## 2021-10-04 ENCOUNTER — Ambulatory Visit: Payer: BC Managed Care – PPO | Admitting: Certified Registered Nurse Anesthetist

## 2021-10-04 ENCOUNTER — Other Ambulatory Visit: Payer: Self-pay

## 2021-10-04 DIAGNOSIS — F319 Bipolar disorder, unspecified: Secondary | ICD-10-CM | POA: Diagnosis not present

## 2021-10-04 DIAGNOSIS — Z7984 Long term (current) use of oral hypoglycemic drugs: Secondary | ICD-10-CM | POA: Insufficient documentation

## 2021-10-04 DIAGNOSIS — Z79890 Hormone replacement therapy (postmenopausal): Secondary | ICD-10-CM | POA: Insufficient documentation

## 2021-10-04 DIAGNOSIS — E039 Hypothyroidism, unspecified: Secondary | ICD-10-CM | POA: Insufficient documentation

## 2021-10-04 DIAGNOSIS — Z1211 Encounter for screening for malignant neoplasm of colon: Secondary | ICD-10-CM | POA: Insufficient documentation

## 2021-10-04 DIAGNOSIS — Z791 Long term (current) use of non-steroidal anti-inflammatories (NSAID): Secondary | ICD-10-CM | POA: Insufficient documentation

## 2021-10-04 DIAGNOSIS — K219 Gastro-esophageal reflux disease without esophagitis: Secondary | ICD-10-CM | POA: Insufficient documentation

## 2021-10-04 DIAGNOSIS — K649 Unspecified hemorrhoids: Secondary | ICD-10-CM | POA: Diagnosis not present

## 2021-10-04 DIAGNOSIS — Z6841 Body Mass Index (BMI) 40.0 and over, adult: Secondary | ICD-10-CM | POA: Insufficient documentation

## 2021-10-04 DIAGNOSIS — F419 Anxiety disorder, unspecified: Secondary | ICD-10-CM | POA: Diagnosis not present

## 2021-10-04 HISTORY — PX: COLONOSCOPY WITH PROPOFOL: SHX5780

## 2021-10-04 SURGERY — COLONOSCOPY WITH PROPOFOL
Anesthesia: General

## 2021-10-04 MED ORDER — ONDANSETRON HCL 4 MG/2ML IJ SOLN
INTRAMUSCULAR | Status: AC
  Filled 2021-10-04: qty 2

## 2021-10-04 MED ORDER — PROPOFOL 10 MG/ML IV BOLUS
INTRAVENOUS | Status: DC | PRN
  Administered 2021-10-04: 80 mg via INTRAVENOUS

## 2021-10-04 MED ORDER — SODIUM CHLORIDE 0.9 % IV SOLN: INTRAVENOUS | Status: DC

## 2021-10-04 MED ORDER — ONDANSETRON HCL 4 MG/2ML IJ SOLN
INTRAMUSCULAR | Status: DC | PRN
  Administered 2021-10-04: 4 mg via INTRAVENOUS

## 2021-10-04 MED ORDER — PROPOFOL 500 MG/50ML IV EMUL
INTRAVENOUS | Status: DC | PRN
  Administered 2021-10-04: 160 ug/kg/min via INTRAVENOUS

## 2021-10-04 NOTE — Transfer of Care (Signed)
Immediate Anesthesia Transfer of Care Note ? ?Patient: Dominique Nunez ? ?Procedure(s) Performed: COLONOSCOPY WITH PROPOFOL ? ?Patient Location: PACU ? ?Anesthesia Type:General ? ?Level of Consciousness: drowsy ? ?Airway & Oxygen Therapy: Patient Spontanous Breathing ? ?Post-op Assessment: Report given to RN and Post -op Vital signs reviewed and stable ? ?Post vital signs: Reviewed and stable ? ?Last Vitals:  ?Vitals Value Taken Time  ?BP 120/71 10/04/21 1110  ?Temp 36.4 ?C 10/04/21 1110  ?Pulse 75 10/04/21 1111  ?Resp 21 10/04/21 1111  ?SpO2 95 % 10/04/21 1111  ?Vitals shown include unvalidated device data. ? ?Last Pain:  ?Vitals:  ? 10/04/21 1110  ?TempSrc: Temporal  ?PainSc: Asleep  ?   ? ?  ? ?Complications: No notable events documented. ?

## 2021-10-04 NOTE — Anesthesia Postprocedure Evaluation (Signed)
Anesthesia Post Note ? ?Patient: Dominique Nunez ? ?Procedure(s) Performed: COLONOSCOPY WITH PROPOFOL ? ?Patient location during evaluation: PACU ?Anesthesia Type: General ?Level of consciousness: awake and alert, oriented and patient cooperative ?Pain management: pain level controlled ?Vital Signs Assessment: post-procedure vital signs reviewed and stable ?Respiratory status: spontaneous breathing, nonlabored ventilation and respiratory function stable ?Cardiovascular status: blood pressure returned to baseline and stable ?Postop Assessment: adequate PO intake ?Anesthetic complications: no ? ? ?No notable events documented. ? ? ?Last Vitals:  ?Vitals:  ? 10/04/21 1110 10/04/21 1120  ?BP: 120/71 114/65  ?Pulse:    ?Resp:    ?Temp: 36.4 ?C   ?SpO2: 98%   ?  ?Last Pain:  ?Vitals:  ? 10/04/21 1130  ?TempSrc:   ?PainSc: 0-No pain  ? ? ?  ?  ?  ?  ?  ?  ? ?Darrin Nipper ? ? ? ? ?

## 2021-10-04 NOTE — Op Note (Signed)
[facility] ?Gastroenterology ?Patient Name: Dominique Nunez ?Procedure Date: 10/04/2021 10:48 AM ?MRN: [id number] ?Account #: [id number] ?Date of Birth: 21-Sep-1965 ?Admit Type: Outpatient ?Age: 56 ?Room: [facility] ENDO ROOM 4 ?Gender: Female ?Note Status: Finalized ?Instrument Name: Colonoscope [id number] ?Procedure:             Colonoscopy ?Indications:           Screening for colorectal malignant neoplasm ?Providers:             Jonathon Bellows MD, MD ?Medicines:             Monitored Anesthesia Care ?Complications:         No immediate complications. ?Procedure:             Pre-Anesthesia Assessment: ?                       - Prior to the procedure, a History and Physical was  ?                       performed, and patient medications, allergies and  ?                       sensitivities were reviewed. The patient's tolerance  ?                       of previous anesthesia was reviewed. ?                       - The risks and benefits of the procedure and the  ?                       sedation options and risks were discussed with the  ?                       patient. All questions were answered and informed  ?                       consent was obtained. ?                       - ASA Grade Assessment: II - A patient with mild  ?                       systemic disease. ?                       After obtaining informed consent, the colonoscope was  ?                       passed under direct vision. Throughout the procedure,  ?                       the patient's blood pressure, pulse, and oxygen  ?                       saturations were monitored continuously. The  ?                       Colonoscope was introduced through the anus and  ?  advanced to the the cecum, identified by the  ?                       appendiceal orifice. The colonoscopy was performed  ?                       with ease. The patient tolerated the procedure well.  ?                       The quality of the bowel preparation  was excellent. ?Findings: ?     The perianal and digital rectal examinations were normal. ?     The entire examined colon appeared normal on direct and retroflexion  ?     views. ?Impression:            - The entire examined colon is normal on direct and  ?                       retroflexion views. ?                       - No specimens collected. ?Recommendation:        - Discharge patient to home (with escort). ?                       - Resume previous diet. ?                       - Continue present medications. ?                       - Repeat colonoscopy in 10 years for screening  ?                       purposes. ?Procedure Code(s):     --- Professional --- ?                       [phone], Colonoscopy, flexible; diagnostic, including  ?                       collection of specimen(s) by brushing or washing, when  ?                       performed (separate procedure) ?Diagnosis Code(s):     --- Professional --- ?                       Z12.11, Encounter for screening for malignant neoplasm  ?                       of colon ?CPT copyright 2019 American Medical Association. All rights reserved. ?The codes documented in this report are preliminary and upon coder review may  ?be revised to meet current compliance requirements. ?Jonathon Bellows, MD ?Jonathon Bellows MD, MD ?10/04/2021 11:09:18 AM ?This report has been signed electronically. ?Number of Addenda: 0 ?Note Initiated On: 10/04/2021 10:48 AM ?Scope Withdrawal Time: 0 hours 7 minutes 45 seconds  ?Total Procedure Duration: 0 hours 10 minutes 17 seconds  ?Estimated Blood Loss:  Estimated blood loss: none. ?     [facility] ?

## 2021-10-04 NOTE — Anesthesia Preprocedure Evaluation (Addendum)
Anesthesia Evaluation  ?Patient identified by MRN, date of birth, ID band ?Patient awake ? ? ? ?Reviewed: ?Allergy & Precautions, NPO status , Patient's Chart, lab work & pertinent test results ? ?History of Anesthesia Complications ?(+) PONV and history of anesthetic complications ? ?Airway ?Mallampati: I ? ? ?Neck ROM: Full ? ? ? Dental ?no notable dental hx. ? ?  ?Pulmonary ?neg pulmonary ROS,  ?  ?Pulmonary exam normal ?breath sounds clear to auscultation ? ? ? ? ? ? Cardiovascular ?Exercise Tolerance: Good ?negative cardio ROS ?Normal cardiovascular exam ?Rhythm:Regular Rate:Normal ? ? ?  ?Neuro/Psych ? Headaches, PSYCHIATRIC DISORDERS (ADHD) Anxiety Depression Bipolar Disorder   ? GI/Hepatic ?GERD  ,  ?Endo/Other  ?Hypothyroidism Class 3 obesity ? Renal/GU ?Renal disease (nephrolithiasis)  ? ?  ?Musculoskeletal ? ? Abdominal ?  ?Peds ? Hematology ?negative hematology ROS ?(+)   ?Anesthesia Other Findings ? ? Reproductive/Obstetrics ? ?  ? ? ? ? ? ? ? ? ? ? ? ? ? ?  ?  ? ? ? ? ? ? ? ?Anesthesia Physical ?Anesthesia Plan ? ?ASA: 3 ? ?Anesthesia Plan: General  ? ?Post-op Pain Management:   ? ?Induction: Intravenous ? ?PONV Risk Score and Plan: 4 or greater and Propofol infusion, TIVA and Treatment may vary due to age or medical condition ? ?Airway Management Planned: Natural Airway ? ?Additional Equipment:  ? ?Intra-op Plan:  ? ?Post-operative Plan:  ? ?Informed Consent: I have reviewed the patients History and Physical, chart, labs and discussed the procedure including the risks, benefits and alternatives for the proposed anesthesia with the patient or authorized representative who has indicated his/her understanding and acceptance.  ? ? ? ? ? ?Plan Discussed with: CRNA ? ?Anesthesia Plan Comments: (LMA/GETA backup discussed.  Patient consented for risks of anesthesia including but not limited to:  ?- adverse reactions to medications ?- damage to eyes, teeth, lips or other oral  mucosa ?- nerve damage due to positioning  ?- sore throat or hoarseness ?- damage to heart, brain, nerves, lungs, other parts of body or loss of life ? ?Informed patient about role of CRNA in peri- and intra-operative care.  Patient voiced understanding.)  ? ? ? ? ? ? ?Anesthesia Quick Evaluation ? ?

## 2021-10-04 NOTE — Anesthesia Procedure Notes (Signed)
Date/Time: 10/04/2021 10:55 AM ?Performed by: Demetrius Charity, CRNA ?Pre-anesthesia Checklist: Patient identified, Emergency Drugs available, Suction available, Patient being monitored and Timeout performed ?Patient Re-evaluated:Patient Re-evaluated prior to induction ?Oxygen Delivery Method: Nasal cannula ?Induction Type: IV induction ?Placement Confirmation: CO2 detector and positive ETCO2 ? ? ? ? ?

## 2021-10-04 NOTE — H&P (Signed)
? ? ? ?Dominique Bellows, MD ?[address] [place], [place], [place], [address] ?[address], [place], [place], [place], [address] ?Phone: [phone]  ?Fax: [phone] ? ?Primary Care Physician:  Lesleigh Noe, MD ? ? ?Pre-Procedure History & Physical: ?HPI:  Dominique Nunez is a 56 y.o. female is here for an colonoscopy. ?  ?Past Medical History:  ?Diagnosis Date  ? Anxiety   ? Bipolar disorder ([place])   ? Depression   ? GERD (gastroesophageal reflux disease)   ? Headache   ? Heart murmur   ? History of fainting spells of unknown cause   ? Hypothyroidism   ? Kidney stones   ? Migraine   ? PONV (postoperative nausea and vomiting)   ? Thyroid disease   ? ? ?Past Surgical History:  ?Procedure Laterality Date  ? ABDOMINAL HYSTERECTOMY  2000  ? BREAST BIOPSY  1984  ? BREAST EXCISIONAL BIOPSY Left   ? CHOLECYSTECTOMY  2000  ? CYSTOSCOPY N/A 06/26/2016  ? Procedure: CYSTOSCOPY;  Surgeon: Arvella Nigh, MD;  Location: [facility];  Service: Gynecology;  Laterality: N/A;  ? INNER EAR SURGERY    ? LAPAROSCOPIC SALPINGO OOPHERECTOMY Bilateral 06/26/2016  ? Procedure: LAPAROSCOPIC SALPINGO OOPHORECTOMY,BILATERAL;  Surgeon: Arvella Nigh, MD;  Location: [facility];  Service: Gynecology;  Laterality: Bilateral;  ? TONSILLECTOMY AND ADENOIDECTOMY  1976  ? WISDOM TOOTH EXTRACTION    ? ? ?Prior to Admission medications   ?Medication Sig Start Date End Date Taking? Authorizing Provider  ?amphetamine-dextroamphetamine (ADDERALL XR) 20 MG 24 hr capsule Take 1 capsule (20 mg total) by mouth every morning. 10/31/21  Yes Lesleigh Noe, MD  ?ARIPiprazole (ABILIFY) 10 MG tablet TAKE 1 TABLET(10 MG) BY MOUTH DAILY 05/09/21  Yes Lesleigh Noe, MD  ?buPROPion (WELLBUTRIN XL) 300 MG 24 hr tablet TAKE 1 TABLET BY MOUTH  DAILY 10/18/20  Yes Lesleigh Noe, MD  ?cetirizine (ZYRTEC) 10 MG tablet Take 10 mg by mouth daily.   Yes [provider]  ?clonazePAM (KLONOPIN) 0.5 MG disintegrating tablet Take 0.5 mg by mouth daily as needed. 04/03/20  Yes  [provider]  ?DOTTI 0.1 MG/24HR patch Place onto the skin 2 (two) times a week. 05/22/20  Yes [provider]  ?fluticasone (FLONASE) 50 MCG/ACT nasal spray Place 1 spray into both nostrils 2 (two) times daily. 07/25/21  Yes Eugenia Pancoast, FNP  ?Ivermectin (SOOLANTRA) 1 % CREA Soolantra 1 % topical cream   Yes [provider]  ?levothyroxine (SYNTHROID) 88 MCG tablet Take 1 tablet (88 mcg total) by mouth daily. 04/01/21  Yes Lesleigh Noe, MD  ?metFORMIN (GLUCOPHAGE) 500 MG tablet TAKE 1 TABLET(500 MG) BY MOUTH TWICE DAILY WITH A MEAL 08/05/21  Yes Lesleigh Noe, MD  ?ofloxacin (OCUFLOX) 0.3 % ophthalmic solution 1 drop daily as needed. 10/05/19  Yes [provider]  ?rizatriptan (MAXALT) 10 MG tablet TAKE 1 TABLET BY MOUTH AS  NEEDED FOR MIGRAINE. MAY  REPEAT IN 2 HOURS IF  NEEDED. 06/20/21  Yes Lesleigh Noe, MD  ?sertraline (ZOLOFT) 100 MG tablet TAKE 1 TABLET BY MOUTH  DAILY 08/02/21  Yes Lesleigh Noe, MD  ?zolpidem (AMBIEN) 10 MG tablet Take 1 tablet (10 mg total) by mouth at bedtime as needed for sleep. 01/05/15  Yes Lucille Passy, MD  ?amphetamine-dextroamphetamine (ADDERALL XR) 20 MG 24 hr capsule Take 1 capsule (20 mg total) by mouth every morning. 09/30/21   Lesleigh Noe, MD  ?amphetamine-dextroamphetamine (ADDERALL XR) 20  MG 24 hr capsule Take 1 capsule (20 mg total) by mouth every morning. 11/30/21   Lesleigh Noe, MD  ?ibuprofen (ADVIL,MOTRIN) 200 MG tablet Take 800 mg by mouth daily as needed for moderate pain.    [provider]  ? ? ?Allergies as of 08/28/2021  ? (No Known Allergies)  ? ? ?Family History  ?Problem Relation Age of Onset  ? Ovarian cancer Mother   ? Asthma Mother   ? COPD Mother   ? Breast cancer Mother 24  ? Alcohol abuse Father   ? Kidney disease Father   ? Diabetes Father   ? Bipolar disorder Father   ? Anxiety disorder Father   ? Depression Father   ? Kidney disease Brother   ? Hepatitis C Brother   ? Alcohol abuse Brother   ?  Bipolar disorder Brother   ? Anxiety disorder Brother   ? Depression Brother   ? Stroke Paternal Aunt   ? Breast cancer Paternal Aunt   ? Alcohol abuse Maternal Grandfather   ? Alcohol abuse Paternal Grandfather   ? Arthritis Paternal Grandfather   ? Heart disease Paternal Grandfather   ? Stroke Paternal Grandfather   ? Pancreatic cancer Paternal Grandfather   ? ? ?Social History  ? ?Socioeconomic History  ? Marital status: Married  ?  Spouse name: walter  ? Number of children: 2  ? Years of education: Not on file  ? Highest education level: Bachelor's degree (e.g., BA, AB, BS)  ?Occupational History  ?  Comment: full time  ?Tobacco Use  ? Smoking status: Never  ? Smokeless tobacco: Never  ?Vaping Use  ? Vaping Use: Never used  ?Substance and Sexual Activity  ? Alcohol use: Yes  ?  Comment: rare  ? Drug use: No  ? Sexual activity: Yes  ?  Birth control/protection: Surgical  ?Other Topics Concern  ? Not on file  ?Social History Narrative  ? 10/13/19  ? From: the area  ? Living: with Husband Thayer Jew [phone])  ? 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 Dog: bulldog  ?   ? Family: 2 adult children - Jarrett [place] and [place]   ?   ? Enjoys: relax, walking, reading  ?   ? Exercise: walking the dog and with husbant  ? Diet: 2 green veggies a night, meat, does like snacks  ?   ? Safety  ? Seat belts: Yes   ? Guns: Yes  and secure  ? Safe in relationships: Yes   ? ?Social Determinants of Health  ? ?Financial Resource Strain: Not on file  ?Food Insecurity: Not on file  ?Transportation Needs: Not on file  ?Physical Activity: Not on file  ?Stress: Not on file  ?Social Connections: Not on file  ?Intimate Partner Violence: Not on file  ? ? ?Review of Systems: ?See HPI, otherwise negative ROS ? ?Physical Exam: ?BP 137/60   Pulse 86   Temp 98.1 ?F (36.7 ?C) (Temporal)   Resp 18   Ht 5' 2.76" (1.594 m)   Wt 111.1 kg   SpO2 98%   BMI 43.74 kg/m?  ?General:   Alert,  pleasant and cooperative in NAD ?Head:  Normocephalic  and atraumatic. ?Neck:  Supple; no masses or thyromegaly. ?Lungs:  Clear throughout to auscultation, normal respiratory effort.    ?Heart:  +S1, +S2, Regular rate and rhythm, No edema. ?Abdomen:  Soft, nontender and nondistended. Normal bowel sounds, without guarding, and without rebound.   ?Neurologic:  Alert and  oriented x4;  grossly normal neurologically. ? ?Impression/Plan: ?TAALIYAH DELPRIORE is here for an colonoscopy to be performed for Screening colonoscopy average risk   ?Risks, benefits, limitations, and alternatives regarding  colonoscopy have been reviewed with the patient.  Questions have been answered.  All parties agreeable. ? ? ?Dominique Bellows, MD  10/04/2021, 10:51 AM ? ?

## 2021-10-07 ENCOUNTER — Encounter: Payer: Self-pay | Admitting: Gastroenterology

## 2021-11-03 ENCOUNTER — Other Ambulatory Visit: Payer: Self-pay | Admitting: Family Medicine

## 2021-11-04 ENCOUNTER — Other Ambulatory Visit: Payer: Self-pay | Admitting: Family Medicine

## 2021-11-04 DIAGNOSIS — G43809 Other migraine, not intractable, without status migrainosus: Secondary | ICD-10-CM

## 2021-11-19 ENCOUNTER — Telehealth: Payer: Self-pay | Admitting: Family Medicine

## 2021-11-19 NOTE — Telephone Encounter (Signed)
Encourage patient to contact the pharmacy for refills or they can request refills through [facility] ? ?Did the patient contact the pharmacy: Yes ? ?LAST APPOINTMENT DATE:  09/30/2021 ? ?MEDICATION:  amphetamine-dextroamphetamine (ADDERALL XR) 20 MG 24 hr capsule ? ?Is the patient out of medication? Almost ? ?If not, how much is left?  3 left ? ?Is this a 90 day supply: No ? ?PHARMACY:  [id number] - [place], [place] ?Phone Number: [phone] ? ?Let patient know to contact pharmacy at the end of the day to make sure medication is ready. ? ?Please notify patient to allow 48-72 hours to process ?  ?

## 2021-11-20 NOTE — Telephone Encounter (Signed)
Left detailed VM for pt explaining that she has refills on file at her pharmacy and her next refill is scheduled for 11/30/21. ?

## 2022-02-18 ENCOUNTER — Other Ambulatory Visit: Payer: Self-pay | Admitting: Family Medicine

## 2022-02-18 DIAGNOSIS — F909 Attention-deficit hyperactivity disorder, unspecified type: Secondary | ICD-10-CM

## 2022-02-18 NOTE — Telephone Encounter (Signed)
  Encourage patient to contact the pharmacy for refills or they can request refills through [facility]  Did the patient contact the pharmacy: No  LAST APPOINTMENT DATE: 09/30/21  NEXT APPOINTMENT DATE: N/A  MEDICATION: amphetamine-dextroamphetamine (ADDERALL XR) 20 MG 24 hr capsule  Is the patient out of medication? No  If not, how much is left? 4 pills  PHARMACY: [facility], [place]  Let patient know to contact pharmacy at the end of the day to make sure medication is ready.  Please notify patient to allow 48-72 hours to process

## 2022-02-18 NOTE — Telephone Encounter (Signed)
Refill request Adderall. Last refill 09/30/21 #30 3 month supply  Last office visit 09/30/21 No upcoming appointment scheduled

## 2022-02-19 NOTE — Telephone Encounter (Signed)
Patient needs an appointment

## 2022-02-19 NOTE — Telephone Encounter (Signed)
Please call patient and schedule an appointment

## 2022-02-19 NOTE — Telephone Encounter (Signed)
Patient scheduled.

## 2022-02-25 ENCOUNTER — Ambulatory Visit: Payer: BC Managed Care – PPO | Admitting: Family Medicine

## 2022-02-25 VITALS — BP 110/70 | HR 72 | Temp 97.0°F | Ht 62.75 in | Wt 254.5 lb

## 2022-02-25 DIAGNOSIS — F410 Panic disorder [episodic paroxysmal anxiety] without agoraphobia: Secondary | ICD-10-CM

## 2022-02-25 DIAGNOSIS — F319 Bipolar disorder, unspecified: Secondary | ICD-10-CM

## 2022-02-25 DIAGNOSIS — F411 Generalized anxiety disorder: Secondary | ICD-10-CM | POA: Diagnosis not present

## 2022-02-25 DIAGNOSIS — F909 Attention-deficit hyperactivity disorder, unspecified type: Secondary | ICD-10-CM

## 2022-02-25 DIAGNOSIS — F3130 Bipolar disorder, current episode depressed, mild or moderate severity, unspecified: Secondary | ICD-10-CM

## 2022-02-25 DIAGNOSIS — F331 Major depressive disorder, recurrent, moderate: Secondary | ICD-10-CM

## 2022-02-25 MED ORDER — AMPHETAMINE-DEXTROAMPHET ER 20 MG PO CP24: 20.0000 mg | ORAL_CAPSULE | ORAL | 0 refills | Status: DC

## 2022-02-25 MED ORDER — AMPHETAMINE-DEXTROAMPHET ER 20 MG PO CP24
20.0000 mg | ORAL_CAPSULE | ORAL | 0 refills | Status: DC
Start: 2022-04-25 — End: 2022-06-24

## 2022-02-25 MED ORDER — SERTRALINE HCL 100 MG PO TABS: 150.0000 mg | ORAL_TABLET | Freq: Every day | ORAL | 0 refills | Status: DC

## 2022-02-25 NOTE — Assessment & Plan Note (Signed)
Her OB/GYN has been filling her diazepam, she notes 3 panic attacks per week.  Zoloft to 150 mg.  Referral as I do think getting all of her psychiatric medications under one prescriber would benefit her.

## 2022-02-25 NOTE — Patient Instructions (Addendum)
Check out these psychiatry offices    [facility] [phone] [facility] [phone] [facility] [phone] [phone]  [facility] [phone] [facility] [phone]  #Referral I have placed a referral to a specialist for you. You should receive a phone call from the specialty office. Make sure your voicemail is not full and that if you are able to answer your phone to unknown or new numbers.   It may take up to 2 weeks to hear about the referral. If you do not hear anything in 2 weeks, please call our office and ask to speak with the referral coordinator.

## 2022-02-25 NOTE — Assessment & Plan Note (Signed)
She is stable, does not worsen anxiety and only takes it a few times a week.  Refill provided Adderall 20 mg long-acting.Marland Kitchen

## 2022-02-25 NOTE — Progress Notes (Signed)
Subjective:     Dominique Nunez is a 56 y.o. female presenting for Follow-up (ADHD)     HPI  #ADHD - doing ok  Depression - continues to feel depressed - has not impacted her daily activies too much  - hard time getting up -   Still getting panic attacks once a week  Review of Systems   Social History   Tobacco Use  Smoking Status Never  Smokeless Tobacco Never        Objective:    BP Readings from Last 3 Encounters:  02/25/22 110/70  10/04/21 114/65  09/30/21 124/86   Wt Readings from Last 3 Encounters:  02/25/22 254 lb 8 oz (115.4 kg)  10/04/21 245 lb (111.1 kg)  09/30/21 245 lb 9.6 oz (111.4 kg)    BP 110/70   Pulse 72   Temp (!) 97 F (36.1 C) (Temporal)   Ht 5' 2.75" (1.594 m)   Wt 254 lb 8 oz (115.4 kg)   SpO2 97%   BMI 45.44 kg/m    Physical Exam Constitutional:      General: She is not in acute distress.    Appearance: She is well-developed. She is not diaphoretic.  HENT:     Right Ear: External ear normal.     Left Ear: External ear normal.     Nose: Nose normal.  Eyes:     Conjunctiva/sclera: Conjunctivae normal.  Cardiovascular:     Rate and Rhythm: Normal rate.  Pulmonary:     Effort: Pulmonary effort is normal.  Musculoskeletal:     Cervical back: Neck supple.  Skin:    General: Skin is warm and dry.     Capillary Refill: Capillary refill takes less than 2 seconds.  Neurological:     Mental Status: She is alert. Mental status is at baseline.  Psychiatric:        Mood and Affect: Mood is anxious.        Behavior: Behavior normal.     Comments: Taping foot during visit        02/25/2022    9:47 AM 04/01/2021   10:01 AM 02/28/2019    2:03 PM  Depression screen PHQ 2/9  Decreased Interest 2 3 0  Down, Depressed, Hopeless '2 3 3  '$ PHQ - 2 Score '4 6 3  '$ Altered sleeping 3 3 0  Tired, decreased energy '2 3 3  '$ Change in appetite 1 3 0  Feeling bad or failure about yourself  '3 3 3  '$ Trouble concentrating '3 3 3  '$ Moving  slowly or fidgety/restless '1 3 3  '$ Suicidal thoughts 0 0 0  PHQ-9 Score '17 24 15  '$ Difficult doing work/chores Somewhat difficult Extremely dIfficult Extremely dIfficult      02/25/2022    9:47 AM 04/01/2021   10:02 AM 12/21/2017   11:51 AM  GAD 7 : Generalized Anxiety Score  Nervous, Anxious, on Edge '2 3 1  '$ Control/stop worrying '2 3 2  '$ Worry too much - different things '2 3 2  '$ Trouble relaxing '2 3 2  '$ Restless '1 3 3  '$ Easily annoyed or irritable 0 3 2  Afraid - awful might happen '3 3 3  '$ Total GAD 7 Score '12 21 15  '$ Anxiety Difficulty Somewhat difficult Extremely difficult Very difficult          Assessment & Plan:   Problem List Items Addressed This Visit       Other   Attention deficit disorder (ADD)  She is stable, does not worsen anxiety and only takes it a few times a week.  Refill provided Adderall 20 mg long-acting..      Relevant Medications   sertraline (ZOLOFT) 100 MG tablet   amphetamine-dextroamphetamine (ADDERALL XR) 20 MG 24 hr capsule (Start on 03/28/2022)   amphetamine-dextroamphetamine (ADDERALL XR) 20 MG 24 hr capsule (Start on 04/25/2022)   amphetamine-dextroamphetamine (ADDERALL XR) 20 MG 24 hr capsule   Other Relevant Orders   Ambulatory referral to Psychiatry   Major depression, recurrent ([place]) - Primary    Increase Zoloft, and referral to psychiatry as with her history and medications I think she would benefit from additional support.  Continue Wellbutrin 300 mg.      Relevant Medications   sertraline (ZOLOFT) 100 MG tablet   Bipolar 1 disorder, depressed ([place])    Patient notes a history of manic episodes involving spending.  She is on Abilify 10 mg.  She is also on Zoloft 100 mg consider changing SSRIs but given history of bipolar will increase dose to 150 mg.  Psych referral for continued support and management.      Relevant Medications   sertraline (ZOLOFT) 100 MG tablet   Other Relevant Orders   Ambulatory referral to Psychiatry    Generalized anxiety disorder with panic attacks    Her OB/GYN has been filling her diazepam, she notes 3 panic attacks per week.  Zoloft to 150 mg.  Referral as I do think getting all of her psychiatric medications under one prescriber would benefit her.      Relevant Medications   sertraline (ZOLOFT) 100 MG tablet   Other Relevant Orders   Ambulatory referral to Psychiatry     Return in about 6 weeks (around 04/08/2022) for physical and check.  Lesleigh Noe, MD

## 2022-02-25 NOTE — Assessment & Plan Note (Signed)
Patient notes a history of manic episodes involving spending.  She is on Abilify 10 mg.  She is also on Zoloft 100 mg consider changing SSRIs but given history of bipolar will increase dose to 150 mg.  Psych referral for continued support and management.

## 2022-02-25 NOTE — Assessment & Plan Note (Signed)
Increase Zoloft, and referral to psychiatry as with her history and medications I think she would benefit from additional support.  Continue Wellbutrin 300 mg.

## 2022-02-26 DIAGNOSIS — D2261 Melanocytic nevi of right upper limb, including shoulder: Secondary | ICD-10-CM | POA: Diagnosis not present

## 2022-02-26 DIAGNOSIS — D2262 Melanocytic nevi of left upper limb, including shoulder: Secondary | ICD-10-CM | POA: Diagnosis not present

## 2022-02-26 DIAGNOSIS — C4361 Malignant melanoma of right upper limb, including shoulder: Secondary | ICD-10-CM | POA: Diagnosis not present

## 2022-02-26 DIAGNOSIS — D225 Melanocytic nevi of trunk: Secondary | ICD-10-CM | POA: Diagnosis not present

## 2022-02-26 DIAGNOSIS — D485 Neoplasm of uncertain behavior of skin: Secondary | ICD-10-CM | POA: Diagnosis not present

## 2022-02-26 DIAGNOSIS — Z85828 Personal history of other malignant neoplasm of skin: Secondary | ICD-10-CM | POA: Diagnosis not present

## 2022-03-12 DIAGNOSIS — C4361 Malignant melanoma of right upper limb, including shoulder: Secondary | ICD-10-CM | POA: Diagnosis not present

## 2022-03-20 ENCOUNTER — Other Ambulatory Visit: Payer: Self-pay | Admitting: Family Medicine

## 2022-03-20 DIAGNOSIS — E039 Hypothyroidism, unspecified: Secondary | ICD-10-CM

## 2022-03-22 ENCOUNTER — Other Ambulatory Visit: Payer: Self-pay | Admitting: Family Medicine

## 2022-03-22 DIAGNOSIS — F411 Generalized anxiety disorder: Secondary | ICD-10-CM

## 2022-03-22 DIAGNOSIS — F319 Bipolar disorder, unspecified: Secondary | ICD-10-CM

## 2022-03-22 DIAGNOSIS — F909 Attention-deficit hyperactivity disorder, unspecified type: Secondary | ICD-10-CM

## 2022-03-25 ENCOUNTER — Telehealth: Payer: Self-pay | Admitting: Family Medicine

## 2022-03-25 ENCOUNTER — Encounter: Payer: Self-pay | Admitting: Family Medicine

## 2022-03-25 DIAGNOSIS — C439 Malignant melanoma of skin, unspecified: Secondary | ICD-10-CM | POA: Insufficient documentation

## 2022-03-25 NOTE — Telephone Encounter (Signed)
Mychart message sent to pt explaining why her refills were denied.

## 2022-03-25 NOTE — Telephone Encounter (Signed)
Patient received a text from her pharmacy stating that her sertraline (ZOLOFT) 100 MG tablet and buPROPion (WELLBUTRIN XL) 300 MG 24 hr tablet will not be refilled. She was wanting to know why. Thank you!

## 2022-04-03 ENCOUNTER — Encounter: Payer: BC Managed Care – PPO | Admitting: Family Medicine

## 2022-04-10 ENCOUNTER — Ambulatory Visit (INDEPENDENT_AMBULATORY_CARE_PROVIDER_SITE_OTHER): Payer: BC Managed Care – PPO | Admitting: Family Medicine

## 2022-04-10 VITALS — BP 100/60 | HR 82 | Temp 97.6°F | Ht 62.75 in | Wt 256.0 lb

## 2022-04-10 DIAGNOSIS — F319 Bipolar disorder, unspecified: Secondary | ICD-10-CM | POA: Diagnosis not present

## 2022-04-10 DIAGNOSIS — E039 Hypothyroidism, unspecified: Secondary | ICD-10-CM | POA: Diagnosis not present

## 2022-04-10 DIAGNOSIS — Z90710 Acquired absence of both cervix and uterus: Secondary | ICD-10-CM | POA: Insufficient documentation

## 2022-04-10 DIAGNOSIS — E785 Hyperlipidemia, unspecified: Secondary | ICD-10-CM | POA: Diagnosis not present

## 2022-04-10 DIAGNOSIS — Z0000 Encounter for general adult medical examination without abnormal findings: Secondary | ICD-10-CM | POA: Diagnosis not present

## 2022-04-10 DIAGNOSIS — R7303 Prediabetes: Secondary | ICD-10-CM | POA: Diagnosis not present

## 2022-04-10 LAB — COMPREHENSIVE METABOLIC PANEL
ALT: 14 U/L (ref 0–35)
AST: 10 U/L (ref 0–37)
Albumin: 3.9 g/dL (ref 3.5–5.2)
Alkaline Phosphatase: 85 U/L (ref 39–117)
BUN: 10 mg/dL (ref 6–23)
CO2: 28 mEq/L (ref 19–32)
Calcium: 9.4 mg/dL (ref 8.4–10.5)
Chloride: 101 mEq/L (ref 96–112)
Creatinine, Ser: 0.82 mg/dL (ref 0.40–1.20)
GFR: 81.02 mL/min (ref >60.00)
Glucose, Bld: 80 mg/dL (ref 70–99)
Potassium: 4.5 mEq/L (ref 3.5–5.1)
Sodium: 136 mEq/L (ref 135–145)
Total Bilirubin: 0.4 mg/dL (ref 0.2–1.2)
Total Protein: 6.5 g/dL (ref 6.0–8.3)

## 2022-04-10 LAB — LIPID PANEL
Cholesterol: 227 mg/dL — ABNORMAL HIGH (ref 0–200)
HDL: 62.5 mg/dL (ref >39.00)
NonHDL: 164.27
Total CHOL/HDL Ratio: 4
Triglycerides: 273 mg/dL — ABNORMAL HIGH (ref 0.0–149.0)
VLDL: 54.6 mg/dL — ABNORMAL HIGH (ref 0.0–40.0)

## 2022-04-10 LAB — LDL CHOLESTEROL, DIRECT: Direct LDL: 140 mg/dL

## 2022-04-10 LAB — TSH: TSH: 1.56 u[IU]/mL (ref 0.35–5.50)

## 2022-04-10 MED ORDER — ARIPIPRAZOLE 10 MG PO TABS: ORAL_TABLET | ORAL | 1 refills | Status: DC

## 2022-04-10 MED ORDER — BUPROPION HCL ER (XL) 300 MG PO TB24: 300.0000 mg | ORAL_TABLET | Freq: Every day | ORAL | 1 refills | Status: DC

## 2022-04-10 NOTE — Progress Notes (Signed)
Annual Exam   Chief Complaint:  Chief Complaint  Patient presents with   Annual Exam    History of Present Illness:  [name] is a 56 y.o. No obstetric history on file. who LMP was No LMP recorded. Patient has had a hysterectomy., presents today for her annual examination.     Nutrition She does get adequate calcium and Vitamin D in her diet. Diet: could be better Exercise: walking regularly in the evenings    Social History   Tobacco Use  Smoking Status Never  Smokeless Tobacco Never   Social History   Substance and Sexual Activity  Alcohol Use Yes   Comment: rare   Social History   Substance and Sexual Activity  Drug Use No     General Health Dentist in the last year: No Eye doctor: yes  Safety The patient wears seatbelts: yes.     The patient feels safe at home and in their relationships: yes.   Menstrual:  Symptoms of menopause: not currently - on hormones  GYN She is single partner, contraception - status post hysterectomy.    Cervical Cancer Screening (21-65):   Last Pap: 2021 normal  Breast Cancer Screening (Age 57-74):  There is FH of breast cancer. There is no FH of ovarian cancer. BRCA screening negative.  Last Mammogram: 08/2021 The patient does want a mammogram this year.    Colon Cancer Screening:  Age 4-75 yo - benefits outweigh the risk. Adults 17-85 yo who have never been screened benefit.  Benefits: 134000 people in 2016 will be diagnosed and 49,000 will die - early detection helps [place]: Complications 2/2 to colonoscopy High Risk (Colonoscopy): genetic disorder (Lynch syndrome or familial adenomatous polyposis), personal hx of IBD, previous adenomatous polyp, or previous colorectal cancer, FamHx start 10 years before the age at diagnosis, increased in males and black race  Options:  FIT - looks for hemoglobin (blood in the stool) - specific and fairly sensitive - must be done annually Cologuard - looks for DNA and blood  - more sensitive - therefore can have more false positives, every 3 years Colonoscopy - every 10 years if normal - sedation, bowl prep, must have someone drive you  Shared decision making and the patient had decided to do colonoscopy 2033.   Social History   Tobacco Use  Smoking Status Never  Smokeless Tobacco Never    Lung Cancer Screening (Ages 91-63): not applicable  Weight Wt Readings from Last 3 Encounters:  04/10/22 256 lb (116.1 kg)  02/25/22 254 lb 8 oz (115.4 kg)  10/04/21 245 lb (111.1 kg)   Patient has very high BMI  BMI Readings from Last 1 Encounters:  04/10/22 45.71 kg/m     Chronic disease screening Blood pressure monitoring:  BP Readings from Last 3 Encounters:  04/10/22 100/60  02/25/22 110/70  10/04/21 114/65    Lipid Monitoring: Indication for screening: age >29, obesity, diabetes, family hx, CV risk factors.  Lipid screening: Yes  Lab Results  Component Value Date   CHOL 233 (H) 04/01/2021   HDL 69 04/01/2021   LDLCALC 138 (H) 04/01/2021   TRIG 147 04/01/2021   CHOLHDL 3.4 04/01/2021     Diabetes Screening: age >33, overweight, family hx, PCOS, hx of gestational diabetes, at risk ethnicity Diabetes Screening screening: Yes  Lab Results  Component Value Date   HGBA1C 5.9 (H) 04/01/2021     Past Medical History:  Diagnosis Date   Anxiety    Bipolar disorder (  [place])    Depression    GERD (gastroesophageal reflux disease)    Headache    Heart murmur    History of fainting spells of unknown cause    Hypothyroidism    Kidney stones    Migraine    PONV (postoperative nausea and vomiting)    Thyroid disease     Past Surgical History:  Procedure Laterality Date   ABDOMINAL HYSTERECTOMY  2000   BREAST BIOPSY  1984   BREAST EXCISIONAL BIOPSY Left    CHOLECYSTECTOMY  2000   COLONOSCOPY WITH PROPOFOL N/A 10/04/2021   Procedure: COLONOSCOPY WITH PROPOFOL;  Surgeon: Jonathon Bellows, MD;  Location: [facility] ENDOSCOPY;  Service:  Gastroenterology;  Laterality: N/A;   CYSTOSCOPY N/A 06/26/2016   Procedure: CYSTOSCOPY;  Surgeon: Arvella Nigh, MD;  Location: [facility];  Service: Gynecology;  Laterality: N/A;   INNER EAR SURGERY     LAPAROSCOPIC SALPINGO OOPHERECTOMY Bilateral 06/26/2016   Procedure: LAPAROSCOPIC SALPINGO OOPHORECTOMY,BILATERAL;  Surgeon: Arvella Nigh, MD;  Location: [facility];  Service: Gynecology;  Laterality: Bilateral;   TONSILLECTOMY AND ADENOIDECTOMY  1976   WISDOM TOOTH EXTRACTION      Prior to Admission medications   Medication Sig Start Date End Date Taking? Authorizing Provider  amphetamine-dextroamphetamine (ADDERALL XR) 20 MG 24 hr capsule Take 1 capsule (20 mg total) by mouth every morning. 03/28/22  Yes Lesleigh Noe, MD  amphetamine-dextroamphetamine (ADDERALL XR) 20 MG 24 hr capsule Take 1 capsule (20 mg total) by mouth every morning. 04/25/22  Yes Lesleigh Noe, MD  ARIPiprazole (ABILIFY) 10 MG tablet TAKE 1 TABLET(10 MG) BY MOUTH DAILY 11/05/21  Yes Lesleigh Noe, MD  buPROPion (WELLBUTRIN XL) 300 MG 24 hr tablet TAKE 1 TABLET BY MOUTH  DAILY 11/07/21  Yes Lesleigh Noe, MD  cetirizine (ZYRTEC) 10 MG tablet Take 10 mg by mouth daily.   Yes [provider]  clonazePAM (KLONOPIN) 0.5 MG disintegrating tablet Take 0.5 mg by mouth daily as needed. 04/03/20  Yes [provider]  DOTTI 0.1 MG/24HR patch Place onto the skin 2 (two) times a week. 05/22/20  Yes [provider]  fluticasone (FLONASE) 50 MCG/ACT nasal spray Place 1 spray into both nostrils 2 (two) times daily. 07/25/21  Yes Dugal, Lawerance Bach, FNP  ibuprofen (ADVIL,MOTRIN) 200 MG tablet Take 800 mg by mouth daily as needed for moderate pain.   Yes [provider]  Ivermectin (SOOLANTRA) 1 % CREA Soolantra 1 % topical cream   Yes [provider]  levothyroxine (SYNTHROID) 88 MCG tablet TAKE 1 TABLET BY MOUTH  DAILY 03/21/22  Yes Lesleigh Noe, MD  metFORMIN (GLUCOPHAGE) 500 MG tablet TAKE 1  TABLET(500 MG) BY MOUTH TWICE DAILY WITH A MEAL 08/05/21  Yes Lesleigh Noe, MD  ofloxacin (OCUFLOX) 0.3 % ophthalmic solution 1 drop daily as needed. 10/05/19  Yes [provider]  rizatriptan (MAXALT) 10 MG tablet TAKE 1 TABLET BY MOUTH AS NEEDED FOR MIGRAINE. MAY REPEAT IN 2  HOURS IF NEEDED 11/07/21  Yes Lesleigh Noe, MD  sertraline (ZOLOFT) 100 MG tablet Take 1.5 tablets (150 mg total) by mouth daily. 02/25/22  Yes Lesleigh Noe, MD  zolpidem (AMBIEN) 10 MG tablet Take 1 tablet (10 mg total) by mouth at bedtime as needed for sleep. 01/05/15  Yes Lucille Passy, MD    No Known Allergies  Gynecologic History: No LMP recorded. Patient has had a hysterectomy.  Obstetric History: No obstetric history on file.  Social History  Socioeconomic History   Marital status: Married    Spouse name: Photographer   Number of children: 2   Years of education: Not on file   Highest education level: Bachelor's degree (e.g., BA, AB, BS)  Occupational History    Comment: full time  Tobacco Use   Smoking status: Never   Smokeless tobacco: Never  Vaping Use   Vaping Use: Never used  Substance and Sexual Activity   Alcohol use: Yes    Comment: rare   Drug use: No   Sexual activity: Yes    Birth control/protection: Surgical  Other Topics Concern   Not on file  Social History Narrative   10/13/19   From: the area   Living: with Husband Thayer Jew (1994)   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, 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   Dog: Optician, dispensing      Family: 2 adult children - Jarrett [place] and [place]       Enjoys: relax, walking, reading      Exercise: walking the dog and with husbant   Diet: 2 green veggies a night, meat, does like snacks      Safety   Seat belts: Yes    Guns: Yes  and secure   Safe in relationships: Yes    Social Determinants of Health   Financial Resource Strain: Low Risk  (08/11/2017)   Overall Financial Resource Strain (CARDIA)    Difficulty of Paying Living Expenses: Not hard at all   Food Insecurity: No Food Insecurity (08/11/2017)   Hunger Vital Sign    Worried About Running Out of Food in the Last Year: Never true    [place] in the Last Year: Never true  Transportation Needs: No Transportation Needs (08/11/2017)   PRAPARE - Hydrologist (Medical): No    Lack of Transportation (Non-Medical): No  Physical Activity: Inactive (08/11/2017)   Exercise Vital Sign    Days of Exercise per Week: 0 days    Minutes of Exercise per Session: 0 min  Stress: Stress Concern Present (08/11/2017)   [place]    Feeling of Stress : Rather much  Social Connections: Moderately Isolated (08/11/2017)   Social Connection and Isolation Panel [NHANES]    Frequency of Communication with Friends and Family: Once a week    Frequency of Social Gatherings with Friends and Family: Never    Attends Religious Services: Never    Marine scientist or Organizations: No    Attends Archivist Meetings: Never    Marital Status: Married  Human resources officer Violence: Not At Risk (08/11/2017)   Humiliation, Afraid, Rape, and Kick questionnaire    Fear of Current or Ex-Partner: No    Emotionally Abused: No    Physically Abused: No    Sexually Abused: No    Family History  Problem Relation Age of Onset   Ovarian cancer Mother    Asthma Mother    COPD Mother    Breast cancer Mother 47   Alcohol abuse Father    Kidney disease Father    Diabetes Father    Bipolar disorder Father    Anxiety disorder Father    Depression Father    Kidney disease Brother    Hepatitis C Brother    Alcohol abuse Brother    Bipolar disorder Brother    Anxiety disorder Brother    Depression Brother    Stroke Paternal Aunt    Breast cancer  Paternal Aunt    Alcohol abuse Maternal Grandfather    Alcohol abuse Paternal Grandfather    Arthritis Paternal Grandfather    Heart disease Paternal Grandfather     Stroke Paternal Grandfather    Pancreatic cancer Paternal Grandfather     Review of Systems  Constitutional:  Negative for chills and fever.  HENT:  Negative for congestion and sore throat.   Eyes:  Negative for blurred vision and double vision.  Respiratory:  Negative for shortness of breath.   Cardiovascular:  Negative for chest pain.  Gastrointestinal:  Negative for heartburn, nausea and vomiting.  Genitourinary: Negative.   Musculoskeletal: Negative.  Negative for myalgias.  Skin:  Negative for rash.  Neurological:  Negative for dizziness and headaches.  Endo/Heme/Allergies:  Does not bruise/bleed easily.  Psychiatric/Behavioral:  Negative for depression. The patient is not nervous/anxious.      Physical Exam BP 100/60   Pulse 82   Temp 97.6 F (36.4 C) (Temporal)   Ht 5' 2.75" (1.594 m)   Wt 256 lb (116.1 kg)   SpO2 98%   BMI 45.71 kg/m    BP Readings from Last 3 Encounters:  04/10/22 100/60  02/25/22 110/70  10/04/21 114/65      Physical Exam Constitutional:      General: She is not in acute distress.    Appearance: She is well-developed. She is not diaphoretic.  HENT:     Head: Normocephalic and atraumatic.     Right Ear: External ear normal.     Left Ear: External ear normal.     Nose: Nose normal.  Eyes:     General: No scleral icterus.    Extraocular Movements: Extraocular movements intact.     Conjunctiva/sclera: Conjunctivae normal.  Cardiovascular:     Rate and Rhythm: Normal rate and regular rhythm.     Heart sounds: No murmur heard. Pulmonary:     Effort: Pulmonary effort is normal. No respiratory distress.     Breath sounds: Normal breath sounds. No wheezing.  Abdominal:     General: Bowel sounds are normal. There is no distension.     Palpations: Abdomen is soft. There is no mass.     Tenderness: There is no abdominal tenderness. There is no guarding or rebound.  Musculoskeletal:        General: Normal range of motion.     Cervical back:  Neck supple.  Lymphadenopathy:     Cervical: No cervical adenopathy.  Skin:    General: Skin is warm and dry.     Capillary Refill: Capillary refill takes less than 2 seconds.  Neurological:     Mental Status: She is alert and oriented to person, place, and time.     Deep Tendon Reflexes: Reflexes normal.  Psychiatric:        Mood and Affect: Mood normal.        Behavior: Behavior normal.     Results:  PHQ-9:  [place] Office Visit from 04/10/2022 in [place] at [place]  PHQ-9 Total Score 18         Assessment: 56 y.o. No obstetric history on file. female here for routine annual physical examination.  Plan: Problem List Items Addressed This Visit       Endocrine   Hypothyroidism   Relevant Orders   TSH     Other   Prediabetes   Relevant Orders   Hemoglobin A1c   Hyperlipidemia   Relevant Orders   Comprehensive metabolic panel  Lipid panel   CBC   Morbid obesity ([place])   Relevant Orders   CBC   Bipolar 1 disorder, depressed (HCC)   Relevant Medications   buPROPion (WELLBUTRIN XL) 300 MG 24 hr tablet   ARIPiprazole (ABILIFY) 10 MG tablet   Other Relevant Orders   CBC   Other Visit Diagnoses     Annual physical exam    -  Primary       Screening: -- Blood pressure screen normal -- cholesterol screening: will obtain -- Weight screening: obese: discussed management options, including lifestyle, dietary, and exercise. -- Diabetes Screening: will obtain -- Nutrition: Encouraged healthy diet  The 10-year ASCVD risk score (Arnett DK, et al., 2019) is: 1.1%   Values used to calculate the score:     Age: 38 years     Sex: Female     Is Non-Hispanic African American: No     Diabetic: No     Tobacco smoker: No     Systolic Blood Pressure: 707 mmHg     Is BP treated: No     HDL Cholesterol: 69 mg/dL     Total Cholesterol: 233 mg/dL  -- Statin therapy for Age 16-75 with CVD risk >7.5%  Psych -- Depression screening (PHQ-9):   [place] Visit from 04/10/2022 in [place] at [facility]  PHQ-9 Total Score 18        Safety -- tobacco screening: not using -- alcohol screening:  low-risk usage. -- no evidence of domestic violence or intimate partner violence.   Cancer Screening -- pap smear not collected per ASCCP guidelines -- family history of breast cancer screening: done. not at high risk. -- Mammogram -  requested -- Colon cancer (age 66+)--  up to date  Immunizations Immunization History  Administered Date(s) Administered   Influenza,inj,Quad PF,6+ Mos 04/30/2017, 04/01/2021   Tdap 09/18/2016    -- flu vaccine not up to date - will get at work -- TDAP q10 years up to date -- Shingles (age >76) not up to date - encouraged getting  -- Covid-19 Vaccine encouraged   Encouraged healthy diet and exercise. Encouraged regular vision and dental care.    Lesleigh Noe, MD

## 2022-04-10 NOTE — Patient Instructions (Addendum)
Psychiatry - check with [facility] in [facility] - Call this psychiatrist  Address: [address] [place], [place] [address] Phone: [phone]  Shingles vaccine - nurse visit or pharmacy

## 2022-04-11 LAB — CBC
HCT: 38.8 % (ref 36.0–46.0)
Hemoglobin: 12.8 g/dL (ref 12.0–15.0)
MCHC: 32.9 g/dL (ref 30.0–36.0)
MCV: 89.1 fl (ref 78.0–100.0)
Platelets: 333 10*3/uL (ref 150.0–400.0)
RBC: 4.36 Mil/uL (ref 3.87–5.11)
RDW: 13.8 % (ref 11.5–15.5)
WBC: 11 10*3/uL — ABNORMAL HIGH (ref 4.0–10.5)

## 2022-04-11 LAB — HEMOGLOBIN A1C: Hgb A1c MFr Bld: 6.3 % (ref 4.6–6.5)

## 2022-04-28 ENCOUNTER — Encounter: Payer: Self-pay | Admitting: Nurse Practitioner

## 2022-04-28 ENCOUNTER — Ambulatory Visit (INDEPENDENT_AMBULATORY_CARE_PROVIDER_SITE_OTHER): Payer: BC Managed Care – PPO | Admitting: Nurse Practitioner

## 2022-04-28 VITALS — BP 163/86 | HR 90 | Ht 62.0 in | Wt 257.0 lb

## 2022-04-28 DIAGNOSIS — F319 Bipolar disorder, unspecified: Secondary | ICD-10-CM | POA: Diagnosis not present

## 2022-04-28 DIAGNOSIS — F331 Major depressive disorder, recurrent, moderate: Secondary | ICD-10-CM

## 2022-04-28 DIAGNOSIS — F909 Attention-deficit hyperactivity disorder, unspecified type: Secondary | ICD-10-CM | POA: Diagnosis not present

## 2022-04-28 DIAGNOSIS — F410 Panic disorder [episodic paroxysmal anxiety] without agoraphobia: Secondary | ICD-10-CM | POA: Diagnosis not present

## 2022-04-28 DIAGNOSIS — F411 Generalized anxiety disorder: Secondary | ICD-10-CM

## 2022-04-28 MED ORDER — CARIPRAZINE HCL 1.5 MG PO CAPS: 1.5000 mg | ORAL_CAPSULE | Freq: Every day | ORAL | 0 refills | Status: DC

## 2022-04-28 MED ORDER — SERTRALINE HCL 100 MG PO TABS: 150.0000 mg | ORAL_TABLET | Freq: Every day | ORAL | 0 refills | Status: DC

## 2022-04-28 NOTE — Progress Notes (Signed)
[facility] MD/PA/NP Initial Note  04/28/2022 3:34 PM Dominique Nunez  MRN:  [id number]  Chief Complaint:  Chief Complaint   Depression     HPI: Dominique Nunez, Flight 56 yo white female, presents with depression worsened over past year. She has been receiving psychiatric care from her PCP, who has now left the practice. C/O apathy, not wanting to shower, and not wanting to get out bed or interact with her family.  Her husband helps her get out of bed and get her day going. Depression began when her father committed suicide in 70.  Also c/o panic attacks twice weekly, unprovoked by any "undue" stress although she states her job is very stressful. She takes clonazepam 0.5 mg daily at 10 AM and for panic attacks, which alleviates the attacks. States that the Adderall provides focus, but depression not alleviated. Notes she has high-stress multi-tasking job responsibilities. Financial stress- states she spends a lot of money with use of credit cards and personal loans just to feel better. Denies mania, but reports irritability and spending. Denies SI/HI.  Has two daughters. Married, but somewhat strained.    Activity: Walking daily and sometimes twice daily if she feels okay emotionally.  Sleep: Sleeps well with Ambien.   Visit Diagnosis: Depression and anxiety with panic attacks.  Past Psychiatric History: Depression, Anxiety and Panic attacks.   Past Medical History:  Past Medical History:  Diagnosis Date   Anxiety    Bipolar disorder ([place])    Depression    GERD (gastroesophageal reflux disease)    Headache    Heart murmur    History of fainting spells of unknown cause    Hypothyroidism    Kidney stones    Migraine    PONV (postoperative nausea and vomiting)    Thyroid disease     Past Surgical History:  Procedure Laterality Date   ABDOMINAL HYSTERECTOMY  2000   BREAST BIOPSY  1984   BREAST EXCISIONAL BIOPSY Left    CHOLECYSTECTOMY  2000   COLONOSCOPY WITH PROPOFOL N/A 10/04/2021    Procedure: COLONOSCOPY WITH PROPOFOL;  Surgeon: Jonathon Bellows, MD;  Location: [facility];  Service: Gastroenterology;  Laterality: N/A;   CYSTOSCOPY N/A 06/26/2016   Procedure: CYSTOSCOPY;  Surgeon: Arvella Nigh, MD;  Location: [facility];  Service: Gynecology;  Laterality: N/A;   INNER EAR SURGERY     LAPAROSCOPIC SALPINGO OOPHERECTOMY Bilateral 06/26/2016   Procedure: LAPAROSCOPIC SALPINGO OOPHORECTOMY,BILATERAL;  Surgeon: Arvella Nigh, MD;  Location: [facility];  Service: Gynecology;  Laterality: Bilateral;   TONSILLECTOMY AND ADENOIDECTOMY  1976   WISDOM TOOTH EXTRACTION      Family Psychiatric History: Father- bipolar type 1, AUD. Mother- MDD and addiction to opiods. Brother- AUD.   Family History:  Family History  Problem Relation Age of Onset   Ovarian cancer Mother    Asthma Mother    COPD Mother    Breast cancer Mother 28   Alcohol abuse Father    Kidney disease Father    Diabetes Father    Bipolar disorder Father    Anxiety disorder Father    Depression Father    Kidney disease Brother    Hepatitis C Brother    Alcohol abuse Brother    Bipolar disorder Brother    Anxiety disorder Brother    Depression Brother    Stroke Paternal Aunt    Breast cancer Paternal Aunt    Alcohol abuse Maternal Grandfather    Alcohol abuse Paternal Grandfather    Arthritis Paternal Merchant navy officer  Heart disease Paternal Grandfather    Stroke Paternal Grandfather    Pancreatic cancer Paternal Grandfather     Social History:  Social History   Socioeconomic History   Marital status: Married    Spouse name: Photographer   Number of children: 2   Years of education: Not on file   Highest education level: Bachelor's degree (e.g., BA, AB, BS)  Occupational History    Comment: full time  Tobacco Use   Smoking status: Never   Smokeless tobacco: Never  Vaping Use   Vaping Use: Never used  Substance and Sexual Activity   Alcohol use: Yes    Comment: rare   Drug use: No   Sexual activity: Yes     Birth control/protection: Surgical  Other Topics Concern   Not on file  Social History Narrative   10/13/19   From: the area   Living: with Husband Dominique Nunez (1994)   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, 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   Dog: Optician, dispensing      Family: 2 adult children - Dominique Nunez and Dominique Nunez       Enjoys: relax, walking, reading      Exercise: walking the dog and with husbant   Diet: 2 green veggies a night, meat, does like snacks      Safety   Seat belts: Yes    Guns: Yes  and secure   Safe in relationships: Yes    Social Determinants of Health   Financial Resource Strain: Low Risk  (08/11/2017)   Overall Financial Resource Strain (CARDIA)    Difficulty of Paying Living Expenses: Not hard at all  Food Insecurity: No Food Insecurity (08/11/2017)   Hunger Vital Sign    Worried About Running Out of Food in the Last Year: Never true    [place] in the Last Year: Never true  Transportation Needs: No Transportation Needs (08/11/2017)   PRAPARE - Hydrologist (Medical): No    Lack of Transportation (Non-Medical): No  Physical Activity: Inactive (08/11/2017)   Exercise Vital Sign    Days of Exercise per Week: 0 days    Minutes of Exercise per Session: 0 min  Stress: Stress Concern Present (08/11/2017)   [place]    Feeling of Stress : Rather much  Social Connections: Moderately Isolated (08/11/2017)   Social Connection and Isolation Panel [NHANES]    Frequency of Communication with Friends and Family: Once a week    Frequency of Social Gatherings with Friends and Family: Never    Attends Religious Services: Never    Marine scientist or Organizations: No    Attends Music therapist: Never    Marital Status: Married    Allergies: No Known Allergies  Metabolic Disorder Labs: Lab Results  Component Value Date   HGBA1C 6.3 04/10/2022   No results found for:  "PROLACTIN" Lab Results  Component Value Date   CHOL 227 (H) 04/10/2022   TRIG 273.0 (H) 04/10/2022   HDL 62.50 04/10/2022   CHOLHDL 4 04/10/2022   VLDL 54.6 (H) 04/10/2022   LDLCALC 138 (H) 04/01/2021   LDLCALC 138 (H) 03/28/2020   Lab Results  Component Value Date   TSH 1.56 04/10/2022   TSH 2.210 04/01/2021    Therapeutic Level Labs: No results found for: "LITHIUM" No results found for: "VALPROATE" No results found for: "CBMZ"  Current Medications: Current Outpatient Medications  Medication Sig Dispense Refill  amphetamine-dextroamphetamine (ADDERALL XR) 20 MG 24 hr capsule Take 1 capsule (20 mg total) by mouth every morning. 30 capsule 0   amphetamine-dextroamphetamine (ADDERALL XR) 20 MG 24 hr capsule Take 1 capsule (20 mg total) by mouth every morning. 30 capsule 0   ARIPiprazole (ABILIFY) 10 MG tablet TAKE 1 TABLET(10 MG) BY MOUTH DAILY 90 tablet 1   buPROPion (WELLBUTRIN XL) 300 MG 24 hr tablet Take 1 tablet (300 mg total) by mouth daily. 90 tablet 1   cetirizine (ZYRTEC) 10 MG tablet Take 10 mg by mouth daily.     clonazePAM (KLONOPIN) 0.5 MG disintegrating tablet Take 0.5 mg by mouth daily as needed.     DOTTI 0.1 MG/24HR patch Place onto the skin 2 (two) times a week.     fluticasone (FLONASE) 50 MCG/ACT nasal spray Place 1 spray into both nostrils 2 (two) times daily. 16 g 5   ibuprofen (ADVIL,MOTRIN) 200 MG tablet Take 800 mg by mouth daily as needed for moderate pain.     Ivermectin (SOOLANTRA) 1 % CREA Soolantra 1 % topical cream     levothyroxine (SYNTHROID) 88 MCG tablet TAKE 1 TABLET BY MOUTH  DAILY 90 tablet 0   metFORMIN (GLUCOPHAGE) 500 MG tablet TAKE 1 TABLET(500 MG) BY MOUTH TWICE DAILY WITH A MEAL 180 tablet 2   ofloxacin (OCUFLOX) 0.3 % ophthalmic solution 1 drop daily as needed.     rizatriptan (MAXALT) 10 MG tablet TAKE 1 TABLET BY MOUTH AS NEEDED FOR MIGRAINE. MAY REPEAT IN 2  HOURS IF NEEDED 10 tablet 0   sertraline (ZOLOFT) 100 MG tablet Take 1.5  tablets (150 mg total) by mouth daily. 135 tablet 0   zolpidem (AMBIEN) 10 MG tablet Take 1 tablet (10 mg total) by mouth at bedtime as needed for sleep. 30 tablet 0   No current facility-administered medications for this visit.    Medication Side Effects: none  Orders placed this visit:  No orders of the defined types were placed in this encounter.   Psychiatric Specialty Exam:  Review of Systems  There were no vitals taken for this visit.There is no height or weight on file to calculate BMI.  General Appearance:   Established Patient Office Visit  Subjective   Patient ID: Dominique Nunez, female    DOB: 1965-12-18  Age: 56 y.o. MRN: [id number]  Chief Complaint  Patient presents with   Depression       States Abilify stopped working after about 4 weeks of use and she has felt more depressed since that time.     Objective:     BP (!) 163/86 (Patient Position: Sitting)   Pulse 90   Ht '5\' 2"'$  (1.575 m)   Wt 257 lb (116.6 kg)   BMI 47.01 kg/m       No results found for any visits on 04/28/22.    The 10-year ASCVD risk score (Arnett DK, et al., 2019) is: 2.9%    Assessment & Plan:   Problem List Items Addressed This Visit   None   F/u in 2 weeks.   Ladarious Kresse, Mort Sawyers, NP   Eye Contact:  Good  Speech:  Normal Rate  Volume:  Normal  Mood:  Depressed  Affect:  Depressed  Thought Process:  Coherent  Orientation:  Full (Time, Place, and Person)  Thought Content: Logical   Suicidal Thoughts:  No  Homicidal Thoughts:  No  Memory:  WNL  Judgement:  Good  Insight:  Good  Psychomotor Activity:  Normal  Concentration:  Concentration: Good  Recall:  Good  Fund of Knowledge: Good  Language: Good  Assets:  Communication Skills Desire for Improvement Financial Resources/Insurance  ADL's:  Impaired-pt feels too depressed to bathe  Cognition: WNL  Prognosis:  Good   Screenings:  GAD-7    Flowsheet Row Office Visit from 04/10/2022 in [place] at [facility] Visit from 02/25/2022 in [place] at [facility] Visit from 04/01/2021 in [place] at [facility] Visit from 12/21/2017 in [place]  Total GAD-7 Score '17 12 21 15      '$ PHQ2-9    [place] Visit from 04/10/2022 in [place] at [facility] Visit from 02/25/2022 in [place] at [facility] Visit from 04/01/2021 in [place] at [facility] Visit from 02/28/2019 in [place] Visit from 12/21/2017 in [place]  PHQ-2 Total Score '6 4 6 3 6  '$ PHQ-9 Total Score '18 17 24 15 23      '$ Flowsheet Row Admission (Discharged) from 10/04/2021 in [place] No Risk       Receiving Psychotherapy: No   Treatment Plan/Recommendations: Stop Abilify. Start Vraylar 1.5 mg in the AM. Highly recommend therapy- provided information of therapists here. Obtain EKG at next PCP visit in Dec. '23 due to stimulant use. Due to binge eating, will consider replacing Adderall with Vyvanse.  Discussed indication, usage, and side-effects with Sherlie; she indicates understanding. F/u in 2 weeks or sooner, if needed.    Tashea Othman, Mort Sawyers, NP

## 2022-04-28 NOTE — Patient Instructions (Signed)
Stop Abilify. Start Vraylar 1.5 mg in the AM; hold off on Adderall usage the first two days of Vraylar use in case of akathisia. Highly recommend therapy- provided information of the therapists here at [facility]. Obtain EKG at next PCP visit in Dec. '23 due to stimulant use. Due to binge eating, will consider replacing Adderall with Vyvanse, but using stepwise approach for now.  Discussed indication, usage, and side-effects of Vraylar with Dominique Nunez; she indicates understanding. F/u in 2 weeks or sooner, if needed.

## 2022-05-02 ENCOUNTER — Other Ambulatory Visit: Payer: Self-pay

## 2022-05-02 DIAGNOSIS — R7303 Prediabetes: Secondary | ICD-10-CM

## 2022-05-02 MED ORDER — METFORMIN HCL 500 MG PO TABS: ORAL_TABLET | ORAL | 0 refills | Status: DC

## 2022-05-07 ENCOUNTER — Telehealth: Payer: Self-pay | Admitting: Family Medicine

## 2022-05-07 NOTE — Telephone Encounter (Signed)
Left VM for pt notifying her that a refill of this RX was sent to her pharmacy on 04/25/22 so she should not be out of it and she would need to contact her pharmacy if she did not pick that refill up.

## 2022-05-07 NOTE — Telephone Encounter (Signed)
  Encourage patient to contact the pharmacy for refills or they can request refills through [facility]  Did the patient contact the pharmacy: no    LAST APPOINTMENT DATE: 04/10/22  NEXT APPOINTMENT DATE:06/27/22  MEDICATION:amphetamine-dextroamphetamine (ADDERALL XR) 20 MG 24 hr capsule  Is the patient out of medication? yes  If not, how much is left?none  Is this a 90 day supply: 30  PHARMACY: [place], [place] Phone:  [phone]  Fax:  [phone]      Let patient know to contact pharmacy at the end of the day to make sure medication is ready.  Please notify patient to allow 48-72 hours to process

## 2022-05-08 ENCOUNTER — Ambulatory Visit (INDEPENDENT_AMBULATORY_CARE_PROVIDER_SITE_OTHER): Payer: BC Managed Care – PPO | Admitting: Behavioral Health

## 2022-05-08 DIAGNOSIS — F32A Depression, unspecified: Secondary | ICD-10-CM

## 2022-05-08 DIAGNOSIS — F331 Major depressive disorder, recurrent, moderate: Secondary | ICD-10-CM

## 2022-05-08 DIAGNOSIS — F319 Bipolar disorder, unspecified: Secondary | ICD-10-CM | POA: Diagnosis not present

## 2022-05-08 DIAGNOSIS — F411 Generalized anxiety disorder: Secondary | ICD-10-CM

## 2022-05-08 DIAGNOSIS — F410 Panic disorder [episodic paroxysmal anxiety] without agoraphobia: Secondary | ICD-10-CM

## 2022-05-08 DIAGNOSIS — F909 Attention-deficit hyperactivity disorder, unspecified type: Secondary | ICD-10-CM | POA: Diagnosis not present
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
## 2022-05-12 ENCOUNTER — Ambulatory Visit (INDEPENDENT_AMBULATORY_CARE_PROVIDER_SITE_OTHER): Payer: BC Managed Care – PPO | Admitting: Nurse Practitioner

## 2022-05-12 ENCOUNTER — Encounter: Payer: Self-pay | Admitting: Nurse Practitioner

## 2022-05-12 DIAGNOSIS — F909 Attention-deficit hyperactivity disorder, unspecified type: Secondary | ICD-10-CM | POA: Diagnosis not present

## 2022-05-12 DIAGNOSIS — F410 Panic disorder [episodic paroxysmal anxiety] without agoraphobia: Secondary | ICD-10-CM

## 2022-05-12 DIAGNOSIS — F331 Major depressive disorder, recurrent, moderate: Secondary | ICD-10-CM | POA: Diagnosis not present

## 2022-05-12 DIAGNOSIS — F411 Generalized anxiety disorder: Secondary | ICD-10-CM | POA: Diagnosis not present

## 2022-05-12 DIAGNOSIS — F319 Bipolar disorder, unspecified: Secondary | ICD-10-CM

## 2022-05-12 MED ORDER — SERTRALINE HCL 100 MG PO TABS: ORAL_TABLET | ORAL | 0 refills | Status: DC

## 2022-05-12 NOTE — Progress Notes (Unsigned)
[facility] Med Check  Patient ID: Dominique Nunez,  MRN: [id number]  PCP: Waunita Schooner, MD  Date of Evaluation: 05/12/2022 Time spent:30 minutes  Chief Complaint: F/u on depression  HISTORY/CURRENT STATUS: HPI- Pt feels much better. Depression has lessened from 10/10 to 6/10. Denies side-effects with Vraylar. States she forgot to to increase Zoloft per her PCP- will do so tonight.   Mood- Stable. Sleep- Still interrupted. Activity- Walking daily Appetite- Good and less craving for sweets.  Concentration- Better with Adderall Denies SI/HI  Individual Medical History/ Review of Systems: Changes? :No   Allergies: Patient has no allergy information on record.  Current Medications:  Current Outpatient Medications:    amphetamine-dextroamphetamine (ADDERALL XR) 20 MG 24 hr capsule, Take 1 capsule (20 mg total) by mouth every morning., Disp: 30 capsule, Rfl: 0   buPROPion (WELLBUTRIN XL) 300 MG 24 hr tablet, Take 1 tablet (300 mg total) by mouth daily., Disp: 90 tablet, Rfl: 1   cariprazine (VRAYLAR) 1.5 MG capsule, Take 1 capsule (1.5 mg total) by mouth daily. Provided pt with 14 days of samples, Disp: 30 capsule, Rfl: 0   sertraline (ZOLOFT) 100 MG tablet, Take 1.5 tablets (150 mg total) by mouth daily., Disp: 45 tablet, Rfl: 0   sertraline (ZOLOFT) 100 MG tablet, Take 1.5 tablets daily, Disp: 45 tablet, Rfl: 0   zolpidem (AMBIEN) 10 MG tablet, Take 1 tablet (10 mg total) by mouth at bedtime as needed for sleep., Disp: 30 tablet, Rfl: 0   amphetamine-dextroamphetamine (ADDERALL XR) 20 MG 24 hr capsule, Take 1 capsule (20 mg total) by mouth every morning., Disp: 30 capsule, Rfl: 0   cetirizine (ZYRTEC) 10 MG tablet, Take 10 mg by mouth daily., Disp: , Rfl:    clonazePAM (KLONOPIN) 0.5 MG disintegrating tablet, Take 0.5 mg by mouth daily as needed., Disp: , Rfl:    DOTTI 0.1 MG/24HR patch, Place onto the skin 2 (two) times a week., Disp: , Rfl:    fluticasone (FLONASE) 50 MCG/ACT  nasal spray, Place 1 spray into both nostrils 2 (two) times daily., Disp: 16 g, Rfl: 5   ibuprofen (ADVIL,MOTRIN) 200 MG tablet, Take 800 mg by mouth daily as needed for moderate pain., Disp: , Rfl:    Ivermectin (SOOLANTRA) 1 % CREA, Soolantra 1 % topical cream, Disp: , Rfl:    levothyroxine (SYNTHROID) 88 MCG tablet, TAKE 1 TABLET BY MOUTH  DAILY, Disp: 90 tablet, Rfl: 0   metFORMIN (GLUCOPHAGE) 500 MG tablet, TAKE 1 TABLET(500 MG) BY MOUTH TWICE DAILY WITH A MEAL, Disp: 180 tablet, Rfl: 0   ofloxacin (OCUFLOX) 0.3 % ophthalmic solution, 1 drop daily as needed., Disp: , Rfl:    rizatriptan (MAXALT) 10 MG tablet, TAKE 1 TABLET BY MOUTH AS NEEDED FOR MIGRAINE. MAY REPEAT IN 2  HOURS IF NEEDED, Disp: 10 tablet, Rfl: 0 Medication Side Effects: none  Family Medical/ Social History: Changes? No  MENTAL HEALTH EXAM:  There were no vitals taken for this visit.There is no height or weight on file to calculate BMI.  General Appearance: Well Groomed  Eye Contact:  Good  Speech:  Normal Rate  Volume:  Normal  Mood:  Euthymic  Affect:  Appropriate and Congruent  Thought Process:  Coherent  Orientation:  Full (Time, Place, and Person)  Thought Content: Logical   Suicidal Thoughts:  No  Homicidal Thoughts:  No  Memory:  WNL  Judgement:  Good  Insight:  Good  Psychomotor Activity:  Normal  Concentration:  Concentration: Good  Recall:  Roel Cluck of Knowledge: Good  Language: Good  Assets:  Financial Resources/Insurance Housing Social Support Transportation  ADL's:  Intact  Cognition: WNL  Prognosis:  Good    DIAGNOSES:    ICD-10-CM   1. Major depressive disorder, recurrent episode, moderate (HCC)  F33.1 sertraline (ZOLOFT) 100 MG tablet    2. Generalized anxiety disorder with panic attacks  F41.1 sertraline (ZOLOFT) 100 MG tablet   F41.0     3. Bipolar 1 disorder, depressed ([place])  F31.9     4. Attention deficit hyperactivity disorder (ADHD), unspecified ADHD type  F90.9      PHQ-9=8  Receiving Psychotherapy: Yes    RECOMMENDATIONS: Cont Vraylar 1.5 mg every morning. Increase Zoloft to 150 mg; pt requests Zoloft refill. F/u in 4 weeks.    Paislei Dorval, Mort Sawyers, NP

## 2022-05-12 NOTE — Patient Instructions (Signed)
F/U in 4 weeks

## 2022-05-26 ENCOUNTER — Other Ambulatory Visit: Payer: Self-pay | Admitting: Nurse Practitioner

## 2022-05-26 DIAGNOSIS — F909 Attention-deficit hyperactivity disorder, unspecified type: Secondary | ICD-10-CM

## 2022-05-26 DIAGNOSIS — F410 Panic disorder [episodic paroxysmal anxiety] without agoraphobia: Secondary | ICD-10-CM

## 2022-05-26 DIAGNOSIS — F319 Bipolar disorder, unspecified: Secondary | ICD-10-CM

## 2022-05-30 ENCOUNTER — Other Ambulatory Visit: Payer: Self-pay | Admitting: Nurse Practitioner

## 2022-05-30 DIAGNOSIS — F331 Major depressive disorder, recurrent, moderate: Secondary | ICD-10-CM

## 2022-05-30 DIAGNOSIS — F411 Generalized anxiety disorder: Secondary | ICD-10-CM

## 2022-06-10 ENCOUNTER — Ambulatory Visit: Payer: BC Managed Care – PPO | Admitting: Nurse Practitioner

## 2022-06-10 DIAGNOSIS — Z20822 Contact with and (suspected) exposure to covid-19: Secondary | ICD-10-CM | POA: Diagnosis not present

## 2022-06-10 DIAGNOSIS — R059 Cough, unspecified: Secondary | ICD-10-CM | POA: Diagnosis not present

## 2022-06-10 DIAGNOSIS — J101 Influenza due to other identified influenza virus with other respiratory manifestations: Secondary | ICD-10-CM | POA: Diagnosis not present

## 2022-06-13 ENCOUNTER — Ambulatory Visit (INDEPENDENT_AMBULATORY_CARE_PROVIDER_SITE_OTHER): Payer: BC Managed Care – PPO

## 2022-06-13 ENCOUNTER — Ambulatory Visit
Admission: EM | Admit: 2022-06-13 | Discharge: 2022-06-13 | Disposition: A | Discharge status: Home / Self Care | Payer: BC Managed Care – PPO | Attending: Emergency Medicine | Admitting: Emergency Medicine

## 2022-06-13 DIAGNOSIS — R059 Cough, unspecified: Secondary | ICD-10-CM | POA: Diagnosis not present

## 2022-06-13 DIAGNOSIS — R058 Other specified cough: Secondary | ICD-10-CM

## 2022-06-13 DIAGNOSIS — J209 Acute bronchitis, unspecified: Secondary | ICD-10-CM | POA: Diagnosis not present

## 2022-06-13 MED ORDER — AZITHROMYCIN 250 MG PO TABS: 250.0000 mg | ORAL_TABLET | Freq: Every day | ORAL | 0 refills | Status: DC

## 2022-06-13 NOTE — Discharge Instructions (Addendum)
Take the Zithromax as directed.  Follow up with your primary care provider if your symptoms are not improving.    

## 2022-06-13 NOTE — ED Provider Notes (Signed)
Roderic Palau    CSN: [id number] Arrival date & time: 06/13/22  1552      History   Chief Complaint Chief Complaint  Patient presents with   Cough    HPI Dominique Nunez is a 56 y.o. female.  Patient presents with productive cough, wheezing, chest congestion x 4 to 5 days.  She reports subjective fever and body aches.  No ear pain, sore throat, shortness of breath, vomiting, diarrhea, or other symptoms.   Patient reports she was positive for Influenza B on 06/10/2022 at [facility]; treated symptomatically.   The history is provided by the patient and medical records.    Past Medical History:  Diagnosis Date   Anxiety    Bipolar disorder ([place])    Depression    GERD (gastroesophageal reflux disease)    Headache    Heart murmur    History of fainting spells of unknown cause    Hypothyroidism    Kidney stones    Migraine    PONV (postoperative nausea and vomiting)    Thyroid disease     Patient Active Problem List   Diagnosis Date Noted   H/O: hysterectomy 04/10/2022   Melanoma of skin ([place]) 03/25/2022   Bipolar 1 disorder, depressed ([place]) 02/25/2022   Generalized anxiety disorder with panic attacks 02/25/2022   Hyperlipidemia 03/28/2020   Morbid obesity ([place]) 03/28/2020   Insomnia 10/13/2019   Major depression, recurrent ([place]) 08/21/2019   Prediabetes 02/28/2019   Attention deficit disorder (ADD) 12/21/2017   CRP elevated 06/16/2016   Chronic arthralgias of knees and hips 05/22/2016   Edema 03/04/2016   Vitamin D deficiency 02/14/2015   Hypothyroidism 12/26/2014   Depression     Past Surgical History:  Procedure Laterality Date   ABDOMINAL HYSTERECTOMY  2000   BREAST BIOPSY  1984   BREAST EXCISIONAL BIOPSY Left    CHOLECYSTECTOMY  2000   COLONOSCOPY WITH PROPOFOL N/A 10/04/2021   Procedure: COLONOSCOPY WITH PROPOFOL;  Surgeon: Jonathon Bellows, MD;  Location: [facility] ENDOSCOPY;  Service: Gastroenterology;  Laterality: N/A;   CYSTOSCOPY N/A 06/26/2016    Procedure: CYSTOSCOPY;  Surgeon: Arvella Nigh, MD;  Location: [facility];  Service: Gynecology;  Laterality: N/A;   INNER EAR SURGERY     LAPAROSCOPIC SALPINGO OOPHERECTOMY Bilateral 06/26/2016   Procedure: LAPAROSCOPIC SALPINGO OOPHORECTOMY,BILATERAL;  Surgeon: Arvella Nigh, MD;  Location: [facility];  Service: Gynecology;  Laterality: Bilateral;   TONSILLECTOMY AND ADENOIDECTOMY  1976   WISDOM TOOTH EXTRACTION      OB History   No obstetric history on file.      Home Medications    Prior to Admission medications   Medication Sig Start Date End Date Taking? Authorizing Provider  azithromycin (ZITHROMAX) 250 MG tablet Take 1 tablet (250 mg total) by mouth daily. Take first 2 tablets together, then 1 every day until finished. 06/13/22  Yes Sharion Balloon, NP  amphetamine-dextroamphetamine (ADDERALL XR) 20 MG 24 hr capsule Take 1 capsule (20 mg total) by mouth every morning. 03/28/22   Waunita Schooner, MD  amphetamine-dextroamphetamine (ADDERALL XR) 20 MG 24 hr capsule Take 1 capsule (20 mg total) by mouth every morning. 04/25/22   Waunita Schooner, MD  buPROPion (WELLBUTRIN XL) 300 MG 24 hr tablet Take 1 tablet (300 mg total) by mouth daily. 04/10/22   Waunita Schooner, MD  cetirizine (ZYRTEC) 10 MG tablet Take 10 mg by mouth daily.    [provider]  clonazePAM (KLONOPIN) 0.5 MG disintegrating tablet Take 0.5 mg by  mouth daily as needed. 04/03/20   [provider]  DOTTI 0.1 MG/24HR patch Place onto the skin 2 (two) times a week. 05/22/20   [provider]  fluticasone (FLONASE) 50 MCG/ACT nasal spray Place 1 spray into both nostrils 2 (two) times daily. 07/25/21   Eugenia Pancoast, FNP  ibuprofen (ADVIL,MOTRIN) 200 MG tablet Take 800 mg by mouth daily as needed for moderate pain.    [provider]  Ivermectin (SOOLANTRA) 1 % CREA Soolantra 1 % topical cream    [provider]  levothyroxine (SYNTHROID) 88 MCG tablet TAKE 1 TABLET BY MOUTH  DAILY 03/21/22   Waunita Schooner, MD  metFORMIN (GLUCOPHAGE) 500 MG tablet TAKE 1 TABLET(500 MG) BY MOUTH TWICE DAILY WITH A MEAL 05/02/22   Dutch Quint B, FNP  ofloxacin (OCUFLOX) 0.3 % ophthalmic solution 1 drop daily as needed. 10/05/19   [provider]  rizatriptan (MAXALT) 10 MG tablet TAKE 1 TABLET BY MOUTH AS NEEDED FOR MIGRAINE. MAY REPEAT IN 2  HOURS IF NEEDED 11/07/21   Waunita Schooner, MD  sertraline (ZOLOFT) 100 MG tablet Take 1.5 tablets daily 05/12/22   Chatmon, Stephani Police, NP  sertraline (ZOLOFT) 100 MG tablet TAKE 1 AND 1/2 TABLETS(150 MG) BY MOUTH DAILY 05/27/22   Chatmon, Stephani Police, NP  VRAYLAR 1.5 MG capsule TAKE 1 CAPSULE BY MOUTH DAILY 06/02/22   Chatmon, Stephani Police, NP  zolpidem (AMBIEN) 10 MG tablet Take 1 tablet (10 mg total) by mouth at bedtime as needed for sleep. 01/05/15   Lucille Passy, MD    Family History Family History  Problem Relation Age of Onset   Ovarian cancer Mother    Asthma Mother    COPD Mother    Breast cancer Mother 23   Alcohol abuse Father    Kidney disease Father    Diabetes Father    Bipolar disorder Father    Anxiety disorder Father    Depression Father    Kidney disease Brother    Hepatitis C Brother    Alcohol abuse Brother    Bipolar disorder Brother    Anxiety disorder Brother    Depression Brother    Stroke Paternal Aunt    Breast cancer Paternal Aunt    Alcohol abuse Maternal Grandfather    Alcohol abuse Paternal Grandfather    Arthritis Paternal Grandfather    Heart disease Paternal Grandfather    Stroke Paternal Grandfather    Pancreatic cancer Paternal Grandfather     Social History Social History   Tobacco Use   Smoking status: Never   Smokeless tobacco: Never  Vaping Use   Vaping Use: Never used  Substance Use Topics   Alcohol use: Yes    Comment: rare   Drug use: No     Allergies   Patient has no known allergies.   Review of Systems Review of Systems  Constitutional:  Negative for chills and fever.  HENT:   Positive for congestion. Negative for ear pain and sore throat.   Respiratory:  Positive for cough and wheezing. Negative for shortness of breath.   Cardiovascular:  Negative for chest pain and palpitations.  Gastrointestinal:  Negative for diarrhea and vomiting.  Skin:  Negative for color change and rash.  All other systems reviewed and are negative.    Physical Exam Triage Vital Signs ED Triage Vitals  Enc Vitals Group     BP 06/13/22 1627 122/80     Pulse Rate 06/13/22 1623 84  Resp 06/13/22 1623 18     Temp 06/13/22 1623 98.1 F (36.7 C)     Temp src --      SpO2 06/13/22 1623 95 %     Weight 06/13/22 1626 250 lb (113.4 kg)     Height 06/13/22 1626 '5\' 2"'$  (1.575 m)     Head Circumference --      Peak Flow --      Pain Score 06/13/22 1622 7     Pain Loc --      Pain Edu? --      Excl. in [place]? --    No data found.  Updated Vital Signs BP 122/80 (BP Location: Left Arm)   Pulse 84   Temp 98.1 F (36.7 C)   Resp 18   Ht '5\' 2"'$  (1.575 m)   Wt 250 lb (113.4 kg)   SpO2 95%   BMI 45.73 kg/m   Visual Acuity Right Eye Distance:   Left Eye Distance:   Bilateral Distance:    Right Eye Near:   Left Eye Near:    Bilateral Near:     Physical Exam Vitals and nursing note reviewed.  Constitutional:      General: She is not in acute distress.    Appearance: She is well-developed. She is obese. She is not ill-appearing.  HENT:     Right Ear: Tympanic membrane normal.     Left Ear: Tympanic membrane normal.     Nose: Nose normal.     Mouth/Throat:     Mouth: Mucous membranes are moist.     Pharynx: Oropharynx is clear.  Cardiovascular:     Rate and Rhythm: Normal rate and regular rhythm.     Heart sounds: Normal heart sounds.  Pulmonary:     Effort: Pulmonary effort is normal. No respiratory distress.     Breath sounds: Rhonchi present.     Comments: Bilateral rhonchi.  Musculoskeletal:     Cervical back: Neck supple.  Skin:    General: Skin is warm and dry.   Neurological:     Mental Status: She is alert.  Psychiatric:        Mood and Affect: Mood normal.        Behavior: Behavior normal.      UC Treatments / Results  Labs (all labs ordered are listed, but only abnormal results are displayed) Labs Reviewed - No data to display  EKG   Radiology DG Chest 2 View  Result Date: 06/13/2022 CLINICAL DATA:  Productive cough. EXAM: CHEST - 2 VIEW COMPARISON:  None Available. FINDINGS: Cardiac silhouette and mediastinal contours are within normal limits. The lungs are clear. No pleural effusion or pneumothorax. Mild multilevel degenerative disc changes of the thoracic spine. Cholecystectomy clips. IMPRESSION: No active cardiopulmonary disease. Electronically Signed   By: Yvonne Kendall M.D.   On: 06/13/2022 17:06    Procedures Procedures (including critical care time)  Medications Ordered in UC Medications - No data to display  Initial Impression / Assessment and Plan / UC Course  I have reviewed the triage vital signs and the nursing notes.  Pertinent labs & imaging results that were available during my care of the patient were reviewed by me and considered in my medical decision making (see chart for details).   Acute bronchitis.  CXR negative.  Patient reports she was positive for Influenza B earlier this week at another UC.  Symptom onset 5 days ago and getting worse.  Treating today with Zithromax.  Discussed symptomatic treatment including Tylenol or ibuprofen, rest, hydration.  Instructed patient to follow up with her PCP if her symptoms are not improving.  She agrees to plan of care.    Final Clinical Impressions(s) / UC Diagnoses   Final diagnoses:  Productive cough  Acute bronchitis, unspecified organism     Discharge Instructions      Take the Zithromax as directed.  Follow up with your primary care provider if your symptoms are not improving.        ED Prescriptions     Medication Sig Dispense Auth. Provider    azithromycin (ZITHROMAX) 250 MG tablet Take 1 tablet (250 mg total) by mouth daily. Take first 2 tablets together, then 1 every day until finished. 6 tablet Sharion Balloon, NP      PDMP not reviewed this encounter.   Sharion Balloon, NP 06/13/22 [phone]

## 2022-06-13 NOTE — ED Triage Notes (Signed)
Patient to Urgent Care with complaints of cough and wheezing/ chest congestion x4 days. Possible fever today. Cough is dry and unproductive.  [facility] at [facility] on Tuesday and diagnosed with Flu B but continues to feel worse.

## 2022-06-16 ENCOUNTER — Other Ambulatory Visit: Payer: Self-pay

## 2022-06-16 DIAGNOSIS — E039 Hypothyroidism, unspecified: Secondary | ICD-10-CM

## 2022-06-16 MED ORDER — LEVOTHYROXINE SODIUM 88 MCG PO TABS: 88.0000 ug | ORAL_TABLET | Freq: Every day | ORAL | 0 refills | Status: DC

## 2022-06-19 ENCOUNTER — Ambulatory Visit
Admission: RE | Admit: 2022-06-19 | Discharge: 2022-06-19 | Disposition: A | Discharge status: Home / Self Care | Payer: BC Managed Care – PPO | Attending: Primary Care | Admitting: Primary Care

## 2022-06-19 ENCOUNTER — Encounter: Payer: Self-pay | Admitting: Primary Care

## 2022-06-19 ENCOUNTER — Ambulatory Visit: Payer: BC Managed Care – PPO | Admitting: Primary Care

## 2022-06-19 ENCOUNTER — Ambulatory Visit
Admission: RE | Admit: 2022-06-19 | Discharge: 2022-06-19 | Disposition: A | Discharge status: Home / Self Care | Payer: BC Managed Care – PPO | Source: Ambulatory Visit | Attending: Primary Care | Admitting: Primary Care

## 2022-06-19 VITALS — BP 118/76 | HR 75 | Temp 97.2°F | Ht 62.0 in | Wt 256.0 lb

## 2022-06-19 DIAGNOSIS — R059 Cough, unspecified: Secondary | ICD-10-CM | POA: Diagnosis not present

## 2022-06-19 DIAGNOSIS — R051 Acute cough: Secondary | ICD-10-CM | POA: Insufficient documentation

## 2022-06-19 DIAGNOSIS — R0989 Other specified symptoms and signs involving the circulatory and respiratory systems: Secondary | ICD-10-CM | POA: Diagnosis not present

## 2022-06-19 MED ORDER — OMEPRAZOLE 20 MG PO CPDR: 20.0000 mg | DELAYED_RELEASE_CAPSULE | Freq: Every day | ORAL | 0 refills | Status: DC

## 2022-06-19 MED ORDER — BENZONATATE 200 MG PO CAPS: 200.0000 mg | ORAL_CAPSULE | Freq: Three times a day (TID) | ORAL | 0 refills | Status: DC | PRN

## 2022-06-19 MED ORDER — PREDNISONE 20 MG PO TABS: ORAL_TABLET | ORAL | 0 refills | Status: DC

## 2022-06-19 NOTE — Progress Notes (Signed)
Subjective:    Patient ID: Dominique Nunez, female    DOB: 1966-04-22, 55 y.o.   MRN: [id number]  HPI  Dominique Nunez is a very pleasant 56 y.o. female patient of Dr. Einar Pheasant with a history of hypothyroidism, ADD, MDD, Bipolar Disorder, GAD, prediabetes who presents today for urgent care follow up.  She presented to urgent care on 06/13/2022 for a 4 to 5-day history of productive cough, wheezing, chest congestion, body aches, fevers.  She previously tested positive for influenza B on 06/10/2022 at [facility].  Was not provided with Tamiflu.  During her visit on 06/13/2022 she was diagnosed with acute bronchitis.  Chest x-ray was negative for pneumonia.  Given her worsening symptoms she was treated with azithromycin course, Tylenol/ibuprofen.  Since her urgent care visit she has completed her course of azithromycin and doesn't feel any better. She continues with wheezing, coughing, body aches, and fatigue. She's since developed sinus pressure and esophageal reflux. Her cough is worse during the night, does occur during the day. Her cough is dry.  Review of Systems  Constitutional:  Positive for fatigue.  HENT:  Positive for congestion, sinus pressure and sinus pain. Negative for postnasal drip and sore throat.   Respiratory:  Positive for cough and chest tightness.   Musculoskeletal:  Positive for myalgias.         Past Medical History:  Diagnosis Date   Anxiety    Bipolar disorder ([facility])    Depression    GERD (gastroesophageal reflux disease)    Headache    Heart murmur    History of fainting spells of unknown cause    Hypothyroidism    Kidney stones    Migraine    PONV (postoperative nausea and vomiting)    Thyroid disease     Social History   Socioeconomic History   Marital status: Married    Spouse name: Photographer   Number of children: 2   Years of education: Not on file   Highest education level: Bachelor's degree (e.g., BA, AB, BS)  Occupational History     Comment: full time  Tobacco Use   Smoking status: Never   Smokeless tobacco: Never  Vaping Use   Vaping Use: Never used  Substance and Sexual Activity   Alcohol use: Yes    Comment: rare   Drug use: No   Sexual activity: Yes    Birth control/protection: Surgical  Other Topics Concern   Not on file  Social History Narrative   10/13/19   From: the area   Living: with Husband Thayer Jew (1994)   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, 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   Dog: Optician, dispensing      Family: 2 adult children - Jarrett [place]oho and Baring       Enjoys: relax, walking, reading      Exercise: walking the dog and with husbant   Diet: 2 green veggies a night, meat, does like snacks      Safety   Seat belts: Yes    Guns: Yes  and secure   Safe in relationships: Yes    Social Determinants of Health   Financial Resource Strain: Low Risk  (08/11/2017)   Overall Financial Resource Strain (CARDIA)    Difficulty of Paying Living Expenses: Not hard at all  Food Insecurity: No Food Insecurity (08/11/2017)   Hunger Vital Sign    Worried About Running Out of Food in the Last Year: Never true    Ran Out of Food  in the Last Year: Never true  Transportation Needs: No Transportation Needs (08/11/2017)   PRAPARE - Hydrologist (Medical): No    Lack of Transportation (Non-Medical): No  Physical Activity: Inactive (08/11/2017)   Exercise Vital Sign    Days of Exercise per Week: 0 days    Minutes of Exercise per Session: 0 min  Stress: Stress Concern Present (08/11/2017)   [place]    Feeling of Stress : Rather much  Social Connections: Moderately Isolated (08/11/2017)   Social Connection and Isolation Panel [NHANES]    Frequency of Communication with Friends and Family: Once a week    Frequency of Social Gatherings with Friends and Family: Never    Attends Religious Services: Never    Marine scientist or  Organizations: No    Attends Archivist Meetings: Never    Marital Status: Married  Human resources officer Violence: Not At Risk (08/11/2017)   Humiliation, Afraid, Rape, and Kick questionnaire    Fear of Current or Ex-Partner: No    Emotionally Abused: No    Physically Abused: No    Sexually Abused: No    Past Surgical History:  Procedure Laterality Date   ABDOMINAL HYSTERECTOMY  2000   BREAST BIOPSY  1984   BREAST EXCISIONAL BIOPSY Left    CHOLECYSTECTOMY  2000   COLONOSCOPY WITH PROPOFOL N/A 10/04/2021   Procedure: COLONOSCOPY WITH PROPOFOL;  Surgeon: Jonathon Bellows, MD;  Location: [facility] ENDOSCOPY;  Service: Gastroenterology;  Laterality: N/A;   CYSTOSCOPY N/A 06/26/2016   Procedure: CYSTOSCOPY;  Surgeon: Arvella Nigh, MD;  Location: [facility];  Service: Gynecology;  Laterality: N/A;   INNER EAR SURGERY     LAPAROSCOPIC SALPINGO OOPHERECTOMY Bilateral 06/26/2016   Procedure: LAPAROSCOPIC SALPINGO OOPHORECTOMY,BILATERAL;  Surgeon: Arvella Nigh, MD;  Location: [facility];  Service: Gynecology;  Laterality: Bilateral;   TONSILLECTOMY AND ADENOIDECTOMY  1976   WISDOM TOOTH EXTRACTION      Family History  Problem Relation Age of Onset   Ovarian cancer Mother    Asthma Mother    COPD Mother    Breast cancer Mother 67   Alcohol abuse Father    Kidney disease Father    Diabetes Father    Bipolar disorder Father    Anxiety disorder Father    Depression Father    Kidney disease Brother    Hepatitis C Brother    Alcohol abuse Brother    Bipolar disorder Brother    Anxiety disorder Brother    Depression Brother    Stroke Paternal Aunt    Breast cancer Paternal Aunt    Alcohol abuse Maternal Grandfather    Alcohol abuse Paternal Grandfather    Arthritis Paternal Grandfather    Heart disease Paternal Grandfather    Stroke Paternal Grandfather    Pancreatic cancer Paternal Grandfather     No Known Allergies  Current Outpatient Medications on File Prior to Visit  Medication Sig  Dispense Refill   amphetamine-dextroamphetamine (ADDERALL XR) 20 MG 24 hr capsule Take 1 capsule (20 mg total) by mouth every morning. 30 capsule 0   buPROPion (WELLBUTRIN XL) 300 MG 24 hr tablet Take 1 tablet (300 mg total) by mouth daily. 90 tablet 1   cetirizine (ZYRTEC) 10 MG tablet Take 10 mg by mouth daily.     clonazePAM (KLONOPIN) 0.5 MG disintegrating tablet Take 0.5 mg by mouth daily as needed.     DOTTI 0.1 MG/24HR patch  Place onto the skin 2 (two) times a week.     ibuprofen (ADVIL,MOTRIN) 200 MG tablet Take 800 mg by mouth daily as needed for moderate pain.     Ivermectin (SOOLANTRA) 1 % CREA Soolantra 1 % topical cream     levothyroxine (SYNTHROID) 88 MCG tablet Take 1 tablet (88 mcg total) by mouth daily. Will need to be seen in office for more refills. 90 tablet 0   metFORMIN (GLUCOPHAGE) 500 MG tablet TAKE 1 TABLET(500 MG) BY MOUTH TWICE DAILY WITH A MEAL 180 tablet 0   ofloxacin (OCUFLOX) 0.3 % ophthalmic solution 1 drop daily as needed.     rizatriptan (MAXALT) 10 MG tablet TAKE 1 TABLET BY MOUTH AS NEEDED FOR MIGRAINE. MAY REPEAT IN 2  HOURS IF NEEDED 10 tablet 0   sertraline (ZOLOFT) 100 MG tablet TAKE 1 AND 1/2 TABLETS(150 MG) BY MOUTH DAILY 45 tablet 0   VRAYLAR 1.5 MG capsule TAKE 1 CAPSULE BY MOUTH DAILY 30 capsule 0   zolpidem (AMBIEN) 10 MG tablet Take 1 tablet (10 mg total) by mouth at bedtime as needed for sleep. 30 tablet 0   amphetamine-dextroamphetamine (ADDERALL XR) 20 MG 24 hr capsule Take 1 capsule (20 mg total) by mouth every morning. (Patient not taking: Reported on 06/19/2022) 30 capsule 0   azithromycin (ZITHROMAX) 250 MG tablet Take 1 tablet (250 mg total) by mouth daily. Take first 2 tablets together, then 1 every day until finished. (Patient not taking: Reported on 06/19/2022) 6 tablet 0   fluticasone (FLONASE) 50 MCG/ACT nasal spray Place 1 spray into both nostrils 2 (two) times daily. (Patient not taking: Reported on 06/19/2022) 16 g 5   sertraline (ZOLOFT)  100 MG tablet Take 1.5 tablets daily (Patient not taking: Reported on 06/19/2022) 45 tablet 0   No current facility-administered medications on file prior to visit.    BP 118/76   Pulse 75   Temp (!) 97.2 F (36.2 C) (Temporal)   Ht '5\' 2"'$  (1.575 m)   Wt 256 lb (116.1 kg)   SpO2 98%   BMI 46.82 kg/m  Objective:   Physical Exam Constitutional:      Appearance: She is not ill-appearing.  HENT:     Right Ear: Tympanic membrane and ear canal normal.     Left Ear: There is impacted cerumen.     Nose:     Right Sinus: No maxillary sinus tenderness or frontal sinus tenderness.     Left Sinus: No maxillary sinus tenderness or frontal sinus tenderness.     Mouth/Throat:     Pharynx: No posterior oropharyngeal erythema.  Eyes:     Conjunctiva/sclera: Conjunctivae normal.  Cardiovascular:     Rate and Rhythm: Normal rate and regular rhythm.  Pulmonary:     Effort: Pulmonary effort is normal.     Breath sounds: Examination of the right-upper field reveals rhonchi. Examination of the left-upper field reveals rhonchi. Examination of the right-lower field reveals rhonchi. Examination of the left-lower field reveals rhonchi. Rhonchi present. No wheezing or rales.     Comments: No cough during visit  Musculoskeletal:     Cervical back: Neck supple.  Lymphadenopathy:     Cervical: No cervical adenopathy.  Skin:    General: Skin is warm and dry.           Assessment & Plan:   Problem List Items Addressed This Visit       Other   Acute cough - Primary    Symptoms  likely still viral, especially given no improvement with antibiotic treatment. GERD could be exacerbating cough.  Given lung sounds on exam we will repeat chest xray. Start prednisone 20 mg tablets. Take 2 tablets by mouth once daily in the morning for 5 days. Start Benzonatate capsules for cough. Take 1 capsule by mouth three times daily as needed for cough. Start omeprazole 20-40 mg HS for reflux cough.   Work note  provided.  She will set up care next week with a new provider and follow up then.  Await results.       Relevant Medications   predniSONE (DELTASONE) 20 MG tablet   benzonatate (TESSALON) 200 MG capsule   omeprazole (PRILOSEC) 20 MG capsule   Other Relevant Orders   DG Chest 2 View       Pleas Koch, NP

## 2022-06-19 NOTE — Patient Instructions (Signed)
Start omeprazole for heartburn and cough. Take 1-2 capsules by mouth at bedtime.  You may take Benzonatate capsules for cough. Take 1 capsule by mouth three times daily as needed for cough.  Start prednisone 20 mg tablets. Take 2 tablets by mouth once daily in the morning for 5 days.  Complete xray(s) as discussed. I will notify you of your results once received.  It was a pleasure meeting you!

## 2022-06-19 NOTE — Assessment & Plan Note (Signed)
Symptoms likely still viral, especially given no improvement with antibiotic treatment. GERD could be exacerbating cough.  Given lung sounds on exam we will repeat chest xray. Start prednisone 20 mg tablets. Take 2 tablets by mouth once daily in the morning for 5 days. Start Benzonatate capsules for cough. Take 1 capsule by mouth three times daily as needed for cough. Start omeprazole 20-40 mg HS for reflux cough.   Work note provided.  She will set up care next week with a new provider and follow up then.  Await results.

## 2022-06-23 ENCOUNTER — Telehealth: Payer: Self-pay | Admitting: Nurse Practitioner

## 2022-06-23 NOTE — Telephone Encounter (Signed)
Please call to schedule appt. She was a patient of [facility], don't know who will see now. She was last seen 10/30 with RTC in 4 weeks and she canceled that appt. We have not prescribed her stimulant previously.

## 2022-06-23 NOTE — Telephone Encounter (Signed)
Pt called and said that she needs a refill on her adderall xr 20 mg.Pharmacy is [facility] in [place]

## 2022-06-24 ENCOUNTER — Other Ambulatory Visit: Payer: Self-pay

## 2022-06-24 DIAGNOSIS — F909 Attention-deficit hyperactivity disorder, unspecified type: Secondary | ICD-10-CM

## 2022-06-24 DIAGNOSIS — F410 Panic disorder [episodic paroxysmal anxiety] without agoraphobia: Secondary | ICD-10-CM

## 2022-06-24 DIAGNOSIS — F319 Bipolar disorder, unspecified: Secondary | ICD-10-CM

## 2022-06-24 NOTE — Telephone Encounter (Signed)
Pended.

## 2022-06-24 NOTE — Telephone Encounter (Signed)
Adderall XR '20mg'$  RF  and Zoloft both need RFS. [place], [place]  Scheduled w/B. White Jul 15, 2022.

## 2022-06-24 NOTE — Telephone Encounter (Signed)
Lvm for pt to call back and schedule.  

## 2022-06-24 NOTE — Telephone Encounter (Signed)
Are you able to send meds?The adderall was prescribed by another doctor before coming here Dominique Nunez did not prescribe it at the visit

## 2022-06-25 MED ORDER — SERTRALINE HCL 100 MG PO TABS: ORAL_TABLET | ORAL | 0 refills | Status: DC

## 2022-06-25 MED ORDER — AMPHETAMINE-DEXTROAMPHET ER 20 MG PO CP24: 20.0000 mg | ORAL_CAPSULE | ORAL | 0 refills | Status: DC

## 2022-06-27 ENCOUNTER — Encounter: Payer: BC Managed Care – PPO | Admitting: Family

## 2022-07-03 ENCOUNTER — Other Ambulatory Visit: Payer: Self-pay | Admitting: Nurse Practitioner

## 2022-07-03 DIAGNOSIS — F410 Panic disorder [episodic paroxysmal anxiety] without agoraphobia: Secondary | ICD-10-CM

## 2022-07-03 DIAGNOSIS — F331 Major depressive disorder, recurrent, moderate: Secondary | ICD-10-CM

## 2022-07-05 ENCOUNTER — Other Ambulatory Visit: Payer: Self-pay | Admitting: Nurse Practitioner

## 2022-07-05 DIAGNOSIS — F331 Major depressive disorder, recurrent, moderate: Secondary | ICD-10-CM

## 2022-07-05 DIAGNOSIS — F410 Panic disorder [episodic paroxysmal anxiety] without agoraphobia: Secondary | ICD-10-CM

## 2022-07-15 ENCOUNTER — Ambulatory Visit: Payer: BC Managed Care – PPO | Admitting: Behavioral Health

## 2022-07-15 ENCOUNTER — Encounter: Payer: Self-pay | Admitting: Behavioral Health

## 2022-07-15 DIAGNOSIS — F331 Major depressive disorder, recurrent, moderate: Secondary | ICD-10-CM

## 2022-07-15 DIAGNOSIS — F909 Attention-deficit hyperactivity disorder, unspecified type: Secondary | ICD-10-CM

## 2022-07-15 DIAGNOSIS — F411 Generalized anxiety disorder: Secondary | ICD-10-CM

## 2022-07-15 DIAGNOSIS — F410 Panic disorder [episodic paroxysmal anxiety] without agoraphobia: Secondary | ICD-10-CM | POA: Diagnosis not present

## 2022-07-15 MED ORDER — CLONAZEPAM 0.5 MG PO TBDP: 0.5000 mg | ORAL_TABLET | Freq: Every day | ORAL | 3 refills | Status: DC | PRN

## 2022-07-15 NOTE — Progress Notes (Signed)
[facility]  Patient ID: Dominique Nunez,  MRN: [id number]  PCP: Waunita Schooner, MD  Date of Evaluation: 07/15/2022 Time spent:30 minutes  Chief Complaint:  Chief Complaint   Depression; Anxiety; ADHD; Follow-up; Patient Education; Medication Refill     HISTORY/CURRENT STATUS: HPI  "Dominique Nunez", 57 year old female presents to this office for follow up  and medication management. Says that she feels much better. Depression has lessened from to 3/10 and anxiety is 3/10.  Denies side-effects with Vraylar. She denies mania, no psychosis, no auditory or visual hallucinations.  No SI or HI.     Individual Medical History/ Review of Systems: Changes? :No   Allergies: Patient has no known allergies.  Current Medications:  Current Outpatient Medications:    amphetamine-dextroamphetamine (ADDERALL XR) 20 MG 24 hr capsule, Take 1 capsule (20 mg total) by mouth every morning. (Patient not taking: Reported on 06/19/2022), Disp: 30 capsule, Rfl: 0   amphetamine-dextroamphetamine (ADDERALL XR) 20 MG 24 hr capsule, Take 1 capsule (20 mg total) by mouth every morning., Disp: 30 capsule, Rfl: 0   azithromycin (ZITHROMAX) 250 MG tablet, Take 1 tablet (250 mg total) by mouth daily. Take first 2 tablets together, then 1 every day until finished. (Patient not taking: Reported on 06/19/2022), Disp: 6 tablet, Rfl: 0   benzonatate (TESSALON) 200 MG capsule, Take 1 capsule (200 mg total) by mouth 3 (three) times daily as needed for cough., Disp: 15 capsule, Rfl: 0   buPROPion (WELLBUTRIN XL) 300 MG 24 hr tablet, Take 1 tablet (300 mg total) by mouth daily., Disp: 90 tablet, Rfl: 1   cetirizine (ZYRTEC) 10 MG tablet, Take 10 mg by mouth daily., Disp: , Rfl:    clonazePAM (KLONOPIN) 0.5 MG disintegrating tablet, Take 0.5 mg by mouth daily as needed., Disp: , Rfl:    DOTTI 0.1 MG/24HR patch, Place onto the skin 2 (two) times a week., Disp: , Rfl:    fluticasone (FLONASE) 50 MCG/ACT nasal spray, Place 1 spray  into both nostrils 2 (two) times daily. (Patient not taking: Reported on 06/19/2022), Disp: 16 g, Rfl: 5   ibuprofen (ADVIL,MOTRIN) 200 MG tablet, Take 800 mg by mouth daily as needed for moderate pain., Disp: , Rfl:    Ivermectin (SOOLANTRA) 1 % CREA, Soolantra 1 % topical cream, Disp: , Rfl:    levothyroxine (SYNTHROID) 88 MCG tablet, Take 1 tablet (88 mcg total) by mouth daily. Will need to be seen in office for more refills., Disp: 90 tablet, Rfl: 0   metFORMIN (GLUCOPHAGE) 500 MG tablet, TAKE 1 TABLET(500 MG) BY MOUTH TWICE DAILY WITH A MEAL, Disp: 180 tablet, Rfl: 0   ofloxacin (OCUFLOX) 0.3 % ophthalmic solution, 1 drop daily as needed., Disp: , Rfl:    omeprazole (PRILOSEC) 20 MG capsule, Take 1-2 capsules (20-40 mg total) by mouth at bedtime. For heartburn and cough, Disp: 60 capsule, Rfl: 0   predniSONE (DELTASONE) 20 MG tablet, Take 2 tablets by mouth once daily in the morning for 5 days., Disp: 10 tablet, Rfl: 0   rizatriptan (MAXALT) 10 MG tablet, TAKE 1 TABLET BY MOUTH AS NEEDED FOR MIGRAINE. MAY REPEAT IN 2  HOURS IF NEEDED, Disp: 10 tablet, Rfl: 0   sertraline (ZOLOFT) 100 MG tablet, Take 1.5 tablets daily (Patient not taking: Reported on 06/19/2022), Disp: 45 tablet, Rfl: 0   sertraline (ZOLOFT) 100 MG tablet, TAKE 1 AND 1/2 TABLETS(150 MG) BY MOUTH DAILY, Disp: 45 tablet, Rfl: 0   VRAYLAR 1.5 MG capsule, TAKE  1 CAPSULE BY MOUTH DAILY, Disp: 30 capsule, Rfl: 0   zolpidem (AMBIEN) 10 MG tablet, Take 1 tablet (10 mg total) by mouth at bedtime as needed for sleep., Disp: 30 tablet, Rfl: 0 Medication Side Effects: none  Family Medical/ Social History: Changes? No  MENTAL HEALTH EXAM:  There were no vitals taken for this visit.There is no height or weight on file to calculate BMI.  General Appearance: Casual, Neat, and Well Groomed  Eye Contact:  Good  Speech:  Clear and Coherent  Volume:  Normal  Mood:  Depressed  Affect:  Congruent  Thought Process:  Coherent  Orientation:   Full (Time, Place, and Person)  Thought Content: Logical   Suicidal Thoughts:  No  Homicidal Thoughts:  No  Memory:  WNL  Judgement:  Good  Insight:  Good  Psychomotor Activity:  Normal  Concentration:  Concentration: Good  Recall:  Good  Fund of Knowledge: Good  Language: Good  Assets:  Desire for Improvement  ADL's:  Intact  Cognition: WNL  Prognosis:  Good    DIAGNOSES:    ICD-10-CM   1. Generalized anxiety disorder with panic attacks  F41.1    F41.0     2. Major depressive disorder, recurrent episode, moderate (HCC)  F33.1     3. Attention deficit hyperactivity disorder (ADHD), unspecified ADHD type  F90.9     4. Moderate episode of recurrent major depressive disorder ([place])  F33.1       Receiving Psychotherapy: yes   RECOMMENDATIONS:  Greater than 50% of  30 min face to face time with patient was spent on counseling and coordination of care. We discussed previous poc with [facility]. Reviewed medications and discussed her current stability. Cont Vraylar 1.5 mg every morning. Increase Zoloft to 150 mg; pt requests Zoloft refill. F/u in 4 weeks.  To call for worsening symptoms or side effects Provided emergency contact information Reviewed PDMP   Elwanda Brooklyn, NP

## 2022-07-17 ENCOUNTER — Ambulatory Visit (INDEPENDENT_AMBULATORY_CARE_PROVIDER_SITE_OTHER): Payer: BC Managed Care – PPO | Admitting: Behavioral Health

## 2022-07-17 DIAGNOSIS — F331 Major depressive disorder, recurrent, moderate: Secondary | ICD-10-CM

## 2022-07-17 DIAGNOSIS — F411 Generalized anxiety disorder: Secondary | ICD-10-CM

## 2022-07-17 DIAGNOSIS — F410 Panic disorder [episodic paroxysmal anxiety] without agoraphobia: Secondary | ICD-10-CM | POA: Diagnosis not present

## 2022-07-17 DIAGNOSIS — F332 Major depressive disorder, recurrent severe without psychotic features: Secondary | ICD-10-CM

## 2022-07-17 NOTE — Progress Notes (Unsigned)
[facility] Counselor/Therapist Progress Note  Patient ID: Dominique Nunez, MRN: [id number],    Date: 07/18/2022  Time Spent: 87 minutes   Treatment Type: Psychotherapy  Reported Symptoms: The patient reports experiencing depression.   Mental Status Exam:  Appearance:   Casual     Behavior:  Appropriate  Motor:  Normal  Speech/Language:   Clear and Coherent  Affect:  Appropriate  Mood:  normal  Thought process:  normal  Thought content:    WNL  Sensory/Perceptual disturbances:    WNL  Orientation:  oriented to person  Attention:  Good  Concentration:  Good  Memory:  WNL  Fund of knowledge:   Good  Insight:    Good  Judgment:   Good  Impulse Control:  Good   Risk Assessment: Danger to Self:  No Self-injurious Behavior: No Danger to Others: No Duty to Warn:no Physical Aggression / Violence:No  Access to Firearms a concern: No  Gang Involvement:No   Subjective: The patient reports "Doing okay still dealing with depression. I haven't been doing terrible or nothing major". She identified depressive symptoms as "Wanting to stay in bed, not wanting to take a shower". She states she has recently been assigned to work from home for her job. States she is currently not happy with her job. She reports she has applied for other jobs, however she has not had any success. 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 She states belief that she is not able to obtain her masters degree. She reports feeling like "Nobody wants me and I have been there (current job) 24 years". She states belief that she has become complacent and has the ability and confidence to do something else, however she has not received any job offers. She reports her husband is supportive of her desire to find another job. She identified her personal goal as finding another job this year and states plans to not give up. She  reports her insecurity is holding her back. She identified a certain number of applications she would like to complete a week and identified what her ideal job would look like.     The patient reports to worry about her daughter whom is also looking for another job. She states to continue to encourage her daughter and pray for her. She identified thoughts contributing to anxiety and identified what she has control over. The patient reports wanting to find out what makes her happy and states to understand she is responsible for her own feelings. She identified her coping skills of staying focused and encouraging herself by using positive self talk. She identified her take away as considering to volunteer to obtain experience in the field that she would like to work in and states plans to increase her positive self talk. The patient reports she is unable to increase her therapy session to biweekly at this time. She denied SI/HI.  The counselor discussed the patients mental health symptoms and coping strategies utilized. The counselor administered a Major Depressive Inventory screening and a Mood Disorder Questionnaire. The counselor provided assistance to the patient with developing a plan to work towards finding another job. The counselor educated the patient on thoughts/feeling/behaviors are connected. The counselor discussed irrational thoughts and reframing negative automatic irrational thoughts. The counselor discussed utilizing positive self talk.  The counselor affirmed and validated the patients understanding of what is  in her control.  The counselor questioned the patients interest with increasing her therapy session to biweekly. The counselor assessed for SI/HI.  Interventions: Cognitive Behavioral Therapy and Solution-Oriented/Positive Psychology  Diagnosis:   ICD-10-CM   1. Moderate episode of recurrent major depressive disorder (HCC)  F33.1     2. Generalized anxiety disorder with panic  attacks  F41.1    F41.0       Plan:   1) Long Term Goal:  Begin a healthy grieving process around the loss of her father, mother and brother.     Short Term Goal:  Explore and resolve grief and loss issues.     Objective: Identify what stages of grief have been experienced in the continuum of the grieving process.     Objective: Begin verbalizing feelings associated with the loss.    2) Long Term Goal: Alleviate depressive symptoms and return to the previous level of effective functioning.     Short Term Goal: Improve overall mood, personal hygiene and attentiveness to appropriate self-care.     Objective: Describe mood state, energy level, amount of control over thoughts, and sleeping pattern.     Objective: Identify and replace thoughts and behaviors that trigger depressive symptoms.      Objective: Make short and simple to do list and complete three task each day.    3) Long Term Goal: Stabilize anxiety level while increasing the ability to function on a daily basis.      Short Term Goal: Develop strategies to reduce symptoms, or reduce anxiety and improve coping skills.     Objective: Reduce the frequency, intensity and duration of panic attacks.     Objective: Identify, challenge and replace biased, fearful self-talk with reality-based, positive self-talk.       Jannifer Hick, [facility]

## 2022-07-22 ENCOUNTER — Encounter: Payer: Self-pay | Admitting: Behavioral Health

## 2022-08-05 ENCOUNTER — Telehealth: Payer: Self-pay | Admitting: Behavioral Health

## 2022-08-05 ENCOUNTER — Other Ambulatory Visit: Payer: Self-pay

## 2022-08-05 DIAGNOSIS — F909 Attention-deficit hyperactivity disorder, unspecified type: Secondary | ICD-10-CM

## 2022-08-05 MED ORDER — AMPHETAMINE-DEXTROAMPHET ER 20 MG PO CP24: 20.0000 mg | ORAL_CAPSULE | ORAL | 0 refills | Status: DC

## 2022-08-05 NOTE — Telephone Encounter (Signed)
Pended.

## 2022-08-05 NOTE — Telephone Encounter (Signed)
Patient lvm requesting a refill on the Adderall. Appointment scheduled for 09/09/22

## 2022-08-11 ENCOUNTER — Other Ambulatory Visit: Payer: Self-pay | Admitting: Behavioral Health

## 2022-08-11 DIAGNOSIS — F410 Panic disorder [episodic paroxysmal anxiety] without agoraphobia: Secondary | ICD-10-CM

## 2022-08-11 DIAGNOSIS — F331 Major depressive disorder, recurrent, moderate: Secondary | ICD-10-CM

## 2022-08-13 DIAGNOSIS — Z85828 Personal history of other malignant neoplasm of skin: Secondary | ICD-10-CM | POA: Diagnosis not present

## 2022-08-13 DIAGNOSIS — Z85820 Personal history of malignant melanoma of skin: Secondary | ICD-10-CM | POA: Diagnosis not present

## 2022-08-13 DIAGNOSIS — Z1283 Encounter for screening for malignant neoplasm of skin: Secondary | ICD-10-CM | POA: Diagnosis not present

## 2022-08-13 DIAGNOSIS — D225 Melanocytic nevi of trunk: Secondary | ICD-10-CM | POA: Diagnosis not present

## 2022-08-18 ENCOUNTER — Ambulatory Visit (INDEPENDENT_AMBULATORY_CARE_PROVIDER_SITE_OTHER): Payer: BC Managed Care – PPO | Admitting: Behavioral Health

## 2022-08-18 DIAGNOSIS — F331 Major depressive disorder, recurrent, moderate: Secondary | ICD-10-CM

## 2022-08-18 NOTE — Progress Notes (Unsigned)
[facility] Counselor/Therapist Progress Note  Patient ID: Dominique Nunez, MRN: [id number],    Date: 08/18/2022  Time Spent: 60 minutes   Treatment Type: Psychotherapy  Reported Symptoms:  Depression   Mental Status Exam:  Appearance:   Casual     Behavior:  Appropriate and Sharing  Motor:  Normal  Speech/Language:   Clear and Coherent  Affect:  Appropriate and Congruent  Mood:  depressed and sad  Thought process:  normal  Thought content:    WNL  Sensory/Perceptual disturbances:    WNL  Orientation:  oriented to person  Attention:  Good  Concentration:  Good  Memory:  WNL  Fund of knowledge:   Good  Insight:    Good  Judgment:   Good  Impulse Control:  Good   Risk Assessment: Danger to Self:  No Self-injurious Behavior: No Danger to Others: No Duty to Warn:no Physical Aggression / Violence:No  Access to Firearms a concern: No  Gang Involvement:No   Subjective: *** The patient reports "I been doing good" she expressed [place] job that will allow her to be closer to her Patient states gratitude due to her daughters moods have improved  Patient reports she has explored volunteering with the local hospitals  Patient states she is currently unhappy with her current job Patient continues to have hope regarding obtaining a new job  Patient reports it has been a while since she has felt happy within herself  She states belief that obtaining a new job may help her reestablish happiness  Patient discussed deceased loved ones in session  patient became tearful in session  Patient expressed anger regarding her father committing suicide Patient reports feeling anger due to her mom, brother and father "Not fighting hard enough to live" Patient states feeling depression "Creeping in more"  States she is uncertain if emotions experienced are due to medication side effects  Patient reports when her father committed  suicide her brother "Took it the worst" she states belief he experienced a lot of anger and guilt regarding there fathers mother and and father's death  Patient expressed being hurt by other family members making comments that she did not do enough for her parents and brother  Patient identified additional emotions in session she is experiencing as "Hurt, angry, confused, rejected, helpless, insecure, anxious, insignificant at times" however she reports she is hopeful for the future and proud of her children patient states in addition she is bored with her current work position and would like to have an occupation that interest her  Patient states to experience depression, and loneliness due to not wanting to be around others  Patient reports interest with experiencing "Peaceful, powerful and joyful" Patient identified her source of support "Friends and acquaintances" Patient reports belief that she is currently experiencing grief now due to in the past not being able to grieve due to raising her children  Patient identified today's take away as "That was a wonderful exercise I find myself not wanting to open up and I feel lighter and I don't feel as heavy as I did".  Patient denied experiencing SI/HI, [place]   Counselor conducted check in Counselor discussed mental health symptoms patient experienced Counselor explored grief and loss with the patient Counselor conducted an activity in session with the patient that allowed her to express her current feelings related to grief and loss of her deceased love one Counselor educated  the patient on the coping strategy of scheduling pleasant activities to look forward to, consider revisiting things she has done in the past that has brought her joy to help her manage low moods. Counselor discussed utilizing additional coping strategies of positive self talk, and reframing irrational thoughts. Counselor assessed for SI/HI, AH/VH     Interventions: Grief  Therapy  Diagnosis:   ICD-10-CM   1. Major depressive disorder, recurrent episode, moderate (HCC)  F33.1       Plan: ***  1) Long Term Goal:  Begin a healthy grieving process around the loss of her father, mother and brother.     Short Term Goal:  Explore and resolve grief and loss issues.     Objective: Identify what stages of grief have been experienced in the continuum of the grieving process.     Objective: Begin verbalizing feelings associated with the loss.    2) Long Term Goal: Alleviate depressive symptoms and return to the previous level of effective functioning.     Short Term Goal: Improve overall mood, personal hygiene and attentiveness to appropriate self-care.     Objective: Describe mood state, energy level, amount of control over thoughts, and sleeping pattern.     Objective: Identify and replace thoughts and behaviors that trigger depressive symptoms.      Objective: Make short and simple to do list and complete three task each day.    3) Long Term Goal: Stabilize anxiety level while increasing the ability to function on a daily basis.      Short Term Goal: Develop strategies to reduce symptoms, or reduce anxiety and improve coping skills.     Objective: Reduce the frequency, intensity and duration of panic attacks.     Objective: Identify, challenge and replace biased, fearful self-talk with reality-based, positive self-talk.         Jannifer Hick, [facility]

## 2022-08-20 ENCOUNTER — Encounter: Payer: Self-pay | Admitting: Behavioral Health

## 2022-08-25 ENCOUNTER — Other Ambulatory Visit: Payer: Self-pay | Admitting: Family

## 2022-08-25 DIAGNOSIS — E039 Hypothyroidism, unspecified: Secondary | ICD-10-CM

## 2022-09-03 DIAGNOSIS — Z1151 Encounter for screening for human papillomavirus (HPV): Secondary | ICD-10-CM | POA: Diagnosis not present

## 2022-09-03 DIAGNOSIS — Z6841 Body Mass Index (BMI) 40.0 and over, adult: Secondary | ICD-10-CM | POA: Diagnosis not present

## 2022-09-03 DIAGNOSIS — Z1231 Encounter for screening mammogram for malignant neoplasm of breast: Secondary | ICD-10-CM | POA: Diagnosis not present

## 2022-09-03 DIAGNOSIS — Z1272 Encounter for screening for malignant neoplasm of vagina: Secondary | ICD-10-CM | POA: Diagnosis not present

## 2022-09-03 DIAGNOSIS — Z01419 Encounter for gynecological examination (general) (routine) without abnormal findings: Secondary | ICD-10-CM | POA: Diagnosis not present

## 2022-09-08 ENCOUNTER — Other Ambulatory Visit: Payer: Self-pay | Admitting: Obstetrics and Gynecology

## 2022-09-08 DIAGNOSIS — Z803 Family history of malignant neoplasm of breast: Secondary | ICD-10-CM

## 2022-09-09 ENCOUNTER — Ambulatory Visit: Payer: BC Managed Care – PPO | Admitting: Behavioral Health

## 2022-09-09 ENCOUNTER — Encounter: Payer: Self-pay | Admitting: Behavioral Health

## 2022-09-09 DIAGNOSIS — F319 Bipolar disorder, unspecified: Secondary | ICD-10-CM

## 2022-09-09 DIAGNOSIS — F410 Panic disorder [episodic paroxysmal anxiety] without agoraphobia: Secondary | ICD-10-CM | POA: Diagnosis not present

## 2022-09-09 DIAGNOSIS — F411 Generalized anxiety disorder: Secondary | ICD-10-CM

## 2022-09-09 MED ORDER — CARIPRAZINE HCL 3 MG PO CAPS: 3.0000 mg | ORAL_CAPSULE | Freq: Every day | ORAL | 2 refills | Status: DC

## 2022-09-09 NOTE — Progress Notes (Signed)
[facility]  Patient ID: Dominique Nunez,  MRN: [id number]  PCP: Waunita Schooner, MD  Date of Evaluation: 09/09/2022 Time spent:30 minutes  Chief Complaint:  Chief Complaint   Depression; Anxiety; Medication Refill; Patient Education     HISTORY/CURRENT STATUS: HPI "Dominique Nunez", 57 year old female presents to this office for follow up  and medication management. Says that she feels about 40 %  better since starting Vraylar. She is still experiencing unacceptable level of anxiety and depression.  Depression has lessened from to 6/10 and anxiety is 3/10.  Requesting medication adjustment this visit. Denies side-effects with Vraylar. She denies mania, no psychosis, no auditory or visual hallucinations.  No SI or HI.        Individual Medical History/ Review of Systems: Changes? :No   Allergies: Patient has no known allergies.  Current Medications:  Current Outpatient Medications:    amphetamine-dextroamphetamine (ADDERALL XR) 20 MG 24 hr capsule, Take 1 capsule (20 mg total) by mouth every morning. (Patient not taking: Reported on 06/19/2022), Disp: 30 capsule, Rfl: 0   amphetamine-dextroamphetamine (ADDERALL XR) 20 MG 24 hr capsule, Take 1 capsule (20 mg total) by mouth every morning., Disp: 30 capsule, Rfl: 0   azithromycin (ZITHROMAX) 250 MG tablet, Take 1 tablet (250 mg total) by mouth daily. Take first 2 tablets together, then 1 every day until finished. (Patient not taking: Reported on 06/19/2022), Disp: 6 tablet, Rfl: 0   benzonatate (TESSALON) 200 MG capsule, Take 1 capsule (200 mg total) by mouth 3 (three) times daily as needed for cough., Disp: 15 capsule, Rfl: 0   buPROPion (WELLBUTRIN XL) 300 MG 24 hr tablet, Take 1 tablet (300 mg total) by mouth daily., Disp: 90 tablet, Rfl: 1   cetirizine (ZYRTEC) 10 MG tablet, Take 10 mg by mouth daily., Disp: , Rfl:    clonazePAM (KLONOPIN) 0.5 MG disintegrating tablet, Take 1 tablet (0.5 mg total) by mouth daily as needed., Disp: 30  tablet, Rfl: 3   DOTTI 0.1 MG/24HR patch, Place onto the skin 2 (two) times a week., Disp: , Rfl:    fluticasone (FLONASE) 50 MCG/ACT nasal spray, Place 1 spray into both nostrils 2 (two) times daily. (Patient not taking: Reported on 06/19/2022), Disp: 16 g, Rfl: 5   ibuprofen (ADVIL,MOTRIN) 200 MG tablet, Take 800 mg by mouth daily as needed for moderate pain., Disp: , Rfl:    Ivermectin (SOOLANTRA) 1 % CREA, Soolantra 1 % topical cream, Disp: , Rfl:    levothyroxine (SYNTHROID) 88 MCG tablet, Take 1 tablet (88 mcg total) by mouth daily. Will need to be seen in office for more refills., Disp: 90 tablet, Rfl: 0   metFORMIN (GLUCOPHAGE) 500 MG tablet, TAKE 1 TABLET(500 MG) BY MOUTH TWICE DAILY WITH A MEAL, Disp: 180 tablet, Rfl: 0   ofloxacin (OCUFLOX) 0.3 % ophthalmic solution, 1 drop daily as needed., Disp: , Rfl:    omeprazole (PRILOSEC) 20 MG capsule, Take 1-2 capsules (20-40 mg total) by mouth at bedtime. For heartburn and cough, Disp: 60 capsule, Rfl: 0   predniSONE (DELTASONE) 20 MG tablet, Take 2 tablets by mouth once daily in the morning for 5 days., Disp: 10 tablet, Rfl: 0   rizatriptan (MAXALT) 10 MG tablet, TAKE 1 TABLET BY MOUTH AS NEEDED FOR MIGRAINE. MAY REPEAT IN 2  HOURS IF NEEDED, Disp: 10 tablet, Rfl: 0   sertraline (ZOLOFT) 100 MG tablet, Take 1.5 tablets daily (Patient not taking: Reported on 06/19/2022), Disp: 45 tablet, Rfl: 0  sertraline (ZOLOFT) 100 MG tablet, TAKE 1 AND 1/2 TABLETS(150 MG) BY MOUTH DAILY, Disp: 45 tablet, Rfl: 0   VRAYLAR 1.5 MG capsule, TAKE 1 CAPSULE BY MOUTH DAILY, Disp: 30 capsule, Rfl: 0   zolpidem (AMBIEN) 10 MG tablet, Take 1 tablet (10 mg total) by mouth at bedtime as needed for sleep., Disp: 30 tablet, Rfl: 0 Medication Side Effects: anxiety  Family Medical/ Social History: Changes? No  MENTAL HEALTH EXAM:  There were no vitals taken for this visit.There is no height or weight on file to calculate BMI.  General Appearance: Casual, Neat, and Well  Groomed  Eye Contact:  Good  Speech:  Clear and Coherent  Volume:  Normal  Mood:  Anxious, Depressed, and Dysphoric  Affect:  Appropriate, Congruent, Depressed, Flat, and Anxious  Thought Process:  Coherent  Orientation:  Full (Time, Place, and Person)  Thought Content: Logical   Suicidal Thoughts:  No  Homicidal Thoughts:  No  Memory:  WNL  Judgement:  Good  Insight:  Good  Psychomotor Activity:  Normal  Concentration:  Concentration: Good  Recall:  Good  Fund of Knowledge: Good  Language: Good  Assets:  Desire for Improvement  ADL's:  Intact  Cognition: WNL  Prognosis:  Good    DIAGNOSES:    ICD-10-CM   1. Bipolar 1 disorder, depressed ([place])  F31.9     2. Generalized anxiety disorder with panic attacks  F41.1    F41.0       Receiving Psychotherapy: No    RECOMMENDATIONS:   Greater than 50% of  30 min face to face time with patient was spent on counseling and coordination of care. Discussed her current stability. She is is reporting about 40 % improvement since start the Vraylar. She is ok with trying to increase the Vraylar dose this visit. To increase Vraylar to 3 mg every morning.  Continue  Zoloft to 150 mg; pt requests Zoloft refill. F/u in 4 weeks.  To call for worsening symptoms or side effects Provided emergency contact information Reviewed PDMP     Elwanda Brooklyn, NP

## 2022-09-11 ENCOUNTER — Ambulatory Visit: Payer: BC Managed Care – PPO | Admitting: Family

## 2022-09-11 ENCOUNTER — Encounter: Payer: Self-pay | Admitting: Family

## 2022-09-11 VITALS — BP 126/82 | HR 69 | Temp 97.6°F | Ht 62.0 in | Wt 258.4 lb

## 2022-09-11 DIAGNOSIS — J3089 Other allergic rhinitis: Secondary | ICD-10-CM | POA: Diagnosis not present

## 2022-09-11 DIAGNOSIS — Z85820 Personal history of malignant melanoma of skin: Secondary | ICD-10-CM

## 2022-09-11 DIAGNOSIS — R12 Heartburn: Secondary | ICD-10-CM

## 2022-09-11 DIAGNOSIS — R7303 Prediabetes: Secondary | ICD-10-CM | POA: Diagnosis not present

## 2022-09-11 DIAGNOSIS — E039 Hypothyroidism, unspecified: Secondary | ICD-10-CM

## 2022-09-11 DIAGNOSIS — M2550 Pain in unspecified joint: Secondary | ICD-10-CM | POA: Diagnosis not present

## 2022-09-11 DIAGNOSIS — E785 Hyperlipidemia, unspecified: Secondary | ICD-10-CM

## 2022-09-11 DIAGNOSIS — J0110 Acute frontal sinusitis, unspecified: Secondary | ICD-10-CM | POA: Insufficient documentation

## 2022-09-11 MED ORDER — FLUTICASONE PROPIONATE 50 MCG/ACT NA SUSP: 1.0000 | Freq: Two times a day (BID) | NASAL | 5 refills | Status: DC

## 2022-09-11 MED ORDER — OMEPRAZOLE 20 MG PO CPDR: 20.0000 mg | DELAYED_RELEASE_CAPSULE | Freq: Every day | ORAL | 3 refills | Status: DC

## 2022-09-11 NOTE — Assessment & Plan Note (Signed)
Pt advised to work on diet and exercise as tolerated

## 2022-09-11 NOTE — Progress Notes (Signed)
Established Patient Office Visit  Subjective:  Patient ID: Dominique Nunez, female    DOB: 1966/06/28  Age: 57 y.o. MRN: [id number]  CC:  Chief Complaint  Patient presents with   Establish Care    TOC from Dr Einar Pheasant. Needs something different for reflux.    HPI Dominique Nunez is here for a transition of care visit.  Prior provider was: Dr. Waunita Nunez   Pt is with acute concerns.  Heartburn, worsening. Takes about four a day.  Often with caffeine, denies fried fatty foods and or spicy foods. Not a lot of chocolate.   Joint stiffness with swelling, yesterday pretty awful. Right shoulder gave her a lot of pain. Stiffness seems to last all day, bil hands and felt bloated. Also in bil hands, knees and feet.   chronic concerns:  ADD: on adderall 20 mg, has been taking for about five-7 years. Was diagnosed by psychiatrist. She states this helps her with focus, works in Therapist, art, in a call center and helps her to concentrate with multi tasking.   Allergic rhinitis: zyrtec 10 mg once daily. Also daily flonase   Rosacea: uses soolantra cream prn   Bipolar 1 disorder: seeing psychiatry at current. On sertraline 100 mg daily as well as vraylar 3 mg daily. Also clonazepam prn sees therapist regularly.   PMP: h/o total hysterectomy. Currently on Dotti patch for HRT. Gyn taking care of this.  Sleep disorder: taking ambien 10 mg nightly, followed by GYN. Psychiatry aware.   Hypothyroid: taking with all of her meds in the am. She was not aware that she should take separately.   Past Medical History:  Diagnosis Date   Anxiety    Bipolar disorder ([place])    Depression    GERD (gastroesophageal reflux disease)    Headache    Heart murmur    History of fainting spells of unknown cause    Hypothyroidism    Kidney stones    Migraine    PONV (postoperative nausea and vomiting)    Thyroid disease     Past Surgical History:  Procedure Laterality Date   ABDOMINAL HYSTERECTOMY  2000    complete   BREAST BIOPSY Left 1984   CHOLECYSTECTOMY  2000   COLONOSCOPY WITH PROPOFOL N/A 10/04/2021   Procedure: COLONOSCOPY WITH PROPOFOL;  Surgeon: Jonathon Bellows, MD;  Location: [facility] ENDOSCOPY;  Service: Gastroenterology;  Laterality: N/A;   CYSTOSCOPY N/A 06/26/2016   Procedure: CYSTOSCOPY;  Surgeon: Arvella Nigh, MD;  Location: [place] [facility];  Service: Gynecology;  Laterality: N/A;   INNER EAR SURGERY     LAPAROSCOPIC SALPINGO OOPHERECTOMY Bilateral 06/26/2016   Procedure: LAPAROSCOPIC SALPINGO OOPHORECTOMY,BILATERAL;  Surgeon: Arvella Nigh, MD;  Location: [facility];  Service: Gynecology;  Laterality: Bilateral;   TONSILLECTOMY AND ADENOIDECTOMY  1976   WISDOM TOOTH EXTRACTION      Family History  Problem Relation Age of Onset   Ovarian cancer Mother    Asthma Mother    COPD Mother    Breast cancer Mother 74   Alcohol abuse Father    Kidney disease Father    Diabetes Father    Bipolar disorder Father        suicide attempt   Anxiety disorder Father    Depression Father    Kidney disease Brother    Hepatitis C Brother    Alcohol abuse Brother    Bipolar disorder Brother    Anxiety disorder Brother    Depression Brother    Alcohol abuse  Maternal Grandfather    Alcohol abuse Paternal Grandfather    Arthritis Paternal Grandfather    Heart disease Paternal Grandfather    Stroke Paternal Grandfather    Pancreatic cancer Paternal Grandfather    Stroke Paternal Aunt    Breast cancer Paternal Aunt     Social History   Socioeconomic History   Marital status: Married    Spouse name: Photographer   Number of children: 2   Years of education: Not on file   Highest education level: Bachelor's degree (e.g., BA, AB, BS)  Occupational History    Comment: full time    Employer: [employer]  Tobacco Use   Smoking status: Never   Smokeless tobacco: Never  Vaping Use   Vaping Use: Never used  Substance and Sexual Activity   Alcohol use: Yes    Comment: rare   Drug use: No   Sexual  activity: Yes    Partners: Male    Birth control/protection: Surgical  Other Topics Concern   Not on file  Social History Narrative   10/13/19   From: the area   Living: with Husband Thayer Jew (1994)   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, 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   Dog: Optician, dispensing      Family: 2 adult children - Dominique Nunez and [place]       Enjoys: relax, walking, reading      Exercise: walking the dog and with husbant   Diet: 2 green veggies a night, meat, does like snacks      Safety   Seat belts: Yes    Guns: Yes  and secure   Safe in relationships: Yes    Social Determinants of Health   Financial Resource Strain: Low Risk  (08/11/2017)   Overall Financial Resource Strain (CARDIA)    Difficulty of Paying Living Expenses: Not hard at all  Food Insecurity: No Food Insecurity (08/11/2017)   Hunger Vital Sign    Worried About Running Out of Food in the Last Year: Never true    [place] in the Last Year: Never true  Transportation Needs: No Transportation Needs (08/11/2017)   PRAPARE - Hydrologist (Medical): No    Lack of Transportation (Non-Medical): No  Physical Activity: Inactive (08/11/2017)   Exercise Vital Sign    Days of Exercise per Week: 0 days    Minutes of Exercise per Session: 0 min  Stress: Stress Concern Present (08/11/2017)   [place]    Feeling of Stress : Rather much  Social Connections: Moderately Isolated (08/11/2017)   Social Connection and Isolation Panel [NHANES]    Frequency of Communication with Friends and Family: Once a week    Frequency of Social Gatherings with Friends and Family: Never    Attends Religious Services: Never    Marine scientist or Organizations: No    Attends Archivist Meetings: Never    Marital Status: Married  Human resources officer Violence: Not At Risk (08/11/2017)   Humiliation, Afraid, Rape, and Kick questionnaire    Fear of  Current or Ex-Partner: No    Emotionally Abused: No    Physically Abused: No    Sexually Abused: No    Outpatient Medications Prior to Visit  Medication Sig Dispense Refill   amphetamine-dextroamphetamine (ADDERALL XR) 20 MG 24 hr capsule Take 20 mg by mouth daily.     buPROPion (WELLBUTRIN XL) 300 MG 24 hr tablet Take 1 tablet (300  mg total) by mouth daily. 90 tablet 1   cariprazine (VRAYLAR) 3 MG capsule Take 1 capsule (3 mg total) by mouth daily. 30 capsule 2   cetirizine (ZYRTEC) 10 MG tablet Take 10 mg by mouth daily.     clonazePAM (KLONOPIN) 0.5 MG disintegrating tablet Take 1 tablet (0.5 mg total) by mouth daily as needed. 30 tablet 3   DOTTI 0.1 MG/24HR patch Place onto the skin 2 (two) times a week.     ibuprofen (ADVIL,MOTRIN) 200 MG tablet Take 800 mg by mouth daily as needed for moderate pain.     Ivermectin (SOOLANTRA) 1 % CREA Soolantra 1 % topical cream     levothyroxine (SYNTHROID) 88 MCG tablet Take 1 tablet (88 mcg total) by mouth daily. Will need to be seen in office for more refills. 90 tablet 0   metFORMIN (GLUCOPHAGE) 500 MG tablet TAKE 1 TABLET(500 MG) BY MOUTH TWICE DAILY WITH A MEAL 180 tablet 0   rizatriptan (MAXALT) 10 MG tablet TAKE 1 TABLET BY MOUTH AS NEEDED FOR MIGRAINE. MAY REPEAT IN 2  HOURS IF NEEDED 10 tablet 0   sertraline (ZOLOFT) 100 MG tablet TAKE 1 AND 1/2 TABLETS(150 MG) BY MOUTH DAILY 45 tablet 0   zolpidem (AMBIEN) 10 MG tablet Take 1 tablet (10 mg total) by mouth at bedtime as needed for sleep. 30 tablet 0   amphetamine-dextroamphetamine (ADDERALL XR) 20 MG 24 hr capsule Take 1 capsule (20 mg total) by mouth every morning. 30 capsule 0   fluticasone (FLONASE) 50 MCG/ACT nasal spray Place 1 spray into both nostrils 2 (two) times daily. 16 g 5   VRAYLAR 1.5 MG capsule TAKE 1 CAPSULE BY MOUTH DAILY 30 capsule 0   amphetamine-dextroamphetamine (ADDERALL XR) 20 MG 24 hr capsule Take 1 capsule (20 mg total) by mouth every morning. 30 capsule 0    azithromycin (ZITHROMAX) 250 MG tablet Take 1 tablet (250 mg total) by mouth daily. Take first 2 tablets together, then 1 every day until finished. (Patient not taking: Reported on 06/19/2022) 6 tablet 0   benzonatate (TESSALON) 200 MG capsule Take 1 capsule (200 mg total) by mouth 3 (three) times daily as needed for cough. 15 capsule 0   ofloxacin (OCUFLOX) 0.3 % ophthalmic solution 1 drop daily as needed.     omeprazole (PRILOSEC) 20 MG capsule Take 1-2 capsules (20-40 mg total) by mouth at bedtime. For heartburn and cough 60 capsule 0   predniSONE (DELTASONE) 20 MG tablet Take 2 tablets by mouth once daily in the morning for 5 days. 10 tablet 0   sertraline (ZOLOFT) 100 MG tablet Take 1.5 tablets daily (Patient not taking: Reported on 06/19/2022) 45 tablet 0   No facility-administered medications prior to visit.    No Known Allergies  ROS: Pertinent symptoms negative unless otherwise noted in HPI      Objective:    Physical Exam Constitutional:      General: She is not in acute distress.    Appearance: Normal appearance. She is obese. She is not ill-appearing.  HENT:     Right Ear: Tympanic membrane normal.     Left Ear: Tympanic membrane normal.     Nose: Nose normal. No congestion or rhinorrhea.     Right Turbinates: Not enlarged or swollen.     Left Turbinates: Not enlarged or swollen.     Right Sinus: No maxillary sinus tenderness or frontal sinus tenderness.     Left Sinus: No maxillary sinus tenderness or frontal  sinus tenderness.     Mouth/Throat:     Mouth: Mucous membranes are moist.     Pharynx: No pharyngeal swelling, oropharyngeal exudate or posterior oropharyngeal erythema.     Tonsils: No tonsillar exudate.  Eyes:     Extraocular Movements: Extraocular movements intact.     Conjunctiva/sclera: Conjunctivae normal.     Pupils: Pupils are equal, round, and reactive to light.  Neck:     Thyroid: No thyroid mass.  Cardiovascular:     Rate and Rhythm: Normal rate  and regular rhythm.  Pulmonary:     Effort: Pulmonary effort is normal.     Breath sounds: Normal breath sounds.  Lymphadenopathy:     Cervical:     Right cervical: No superficial cervical adenopathy.    Left cervical: No superficial cervical adenopathy.  Neurological:     Mental Status: She is alert.      BP 126/82   Pulse 69   Temp 97.6 F (36.4 C) (Temporal)   Ht '5\' 2"'$  (1.575 m)   Wt 258 lb 6.4 oz (117.2 kg)   SpO2 98%   BMI 47.26 kg/m  Wt Readings from Last 3 Encounters:  09/11/22 258 lb 6.4 oz (117.2 kg)  06/19/22 256 lb (116.1 kg)  06/13/22 250 lb (113.4 kg)     Health Maintenance Due  Topic Date Due   COVID-19 Vaccine (1) Never done   Zoster Vaccines- Shingrix (1 of 2) Never done    There are no preventive care reminders to display for this patient.  Lab Results  Component Value Date   TSH 1.56 04/10/2022   Lab Results  Component Value Date   WBC 11.0 (H) 04/10/2022   HGB 12.8 04/10/2022   HCT 38.8 04/10/2022   MCV 89.1 04/10/2022   PLT 333.0 04/10/2022   Lab Results  Component Value Date   NA 136 04/10/2022   K 4.5 04/10/2022   CO2 28 04/10/2022   GLUCOSE 80 04/10/2022   BUN 10 04/10/2022   CREATININE 0.82 04/10/2022   BILITOT 0.4 04/10/2022   ALKPHOS 85 04/10/2022   AST 10 04/10/2022   ALT 14 04/10/2022   PROT 6.5 04/10/2022   ALBUMIN 3.9 04/10/2022   CALCIUM 9.4 04/10/2022   EGFR 94 04/01/2021   GFR 81.02 04/10/2022   Lab Results  Component Value Date   CHOL 227 (H) 04/10/2022   Lab Results  Component Value Date   HDL 62.50 04/10/2022   Lab Results  Component Value Date   LDLCALC 138 (H) 04/01/2021   Lab Results  Component Value Date   TRIG 273.0 (H) 04/10/2022   Lab Results  Component Value Date   CHOLHDL 4 04/10/2022   Lab Results  Component Value Date   HGBA1C 6.3 04/10/2022      Assessment & Plan:   History of melanoma -     TSH  Non-seasonal allergic rhinitis, unspecified trigger -     Fluticasone  Propionate; Place 1 spray into both nostrils 2 (two) times daily.  Dispense: 16 g; Refill: 5  Polyarthralgia -     ANA -     C-reactive protein -     Rheumatoid factor -     Sedimentation rate  Heartburn -     Omeprazole; Take 1 capsule (20 mg total) by mouth daily.  Dispense: 30 capsule; Refill: 3  Hypothyroidism, unspecified type Assessment & Plan: D/w pt to take levothyroxine separate by one hour from food and other medication.  Also advised >4  hours with ppi use and or vitamins.   Prediabetes Assessment & Plan: Ordering A1c today pending results Pt advised of the following: Work on a diabetic diet, try to incorporate exercise at least 20-30 a day for 3 days a week or more.    Orders: -     Hemoglobin A1c  Hyperlipidemia, unspecified hyperlipidemia type Assessment & Plan: Ordered lipid panel, pending results. Work on low cholesterol diet and exercise as tolerated   Orders: -     Lipid panel  Morbid obesity ([place]) Assessment & Plan: Pt advised to work on diet and exercise as tolerated      Meds ordered this encounter  Medications   fluticasone (FLONASE) 50 MCG/ACT nasal spray    Sig: Place 1 spray into both nostrils 2 (two) times daily.    Dispense:  16 g    Refill:  5   omeprazole (PRILOSEC) 20 MG capsule    Sig: Take 1 capsule (20 mg total) by mouth daily.    Dispense:  30 capsule    Refill:  3    Order Specific Question:   Supervising Provider    Answer:   Diona Browner, AMY E [id number]    Follow-up: Return in about 6 months (around 03/12/2023) for f/u cholesterol.    Eugenia Pancoast, FNP

## 2022-09-11 NOTE — Assessment & Plan Note (Signed)
Ordered lipid panel, pending results. Work on low cholesterol diet and exercise as tolerated ? ?

## 2022-09-11 NOTE — Assessment & Plan Note (Signed)
D/w pt to take levothyroxine separate by one hour from food and other medication.  Also advised >4 hours with ppi use and or vitamins.

## 2022-09-11 NOTE — Assessment & Plan Note (Signed)
Ordering A1c today pending results Pt advised of the following: Work on a diabetic diet, try to incorporate exercise at least 20-30 a day for 3 days a week or more.

## 2022-09-12 ENCOUNTER — Other Ambulatory Visit: Payer: Self-pay | Admitting: Family

## 2022-09-12 DIAGNOSIS — R7982 Elevated C-reactive protein (CRP): Secondary | ICD-10-CM

## 2022-09-12 DIAGNOSIS — M2550 Pain in unspecified joint: Secondary | ICD-10-CM

## 2022-09-12 DIAGNOSIS — R7303 Prediabetes: Secondary | ICD-10-CM

## 2022-09-12 DIAGNOSIS — G8929 Other chronic pain: Secondary | ICD-10-CM

## 2022-09-12 LAB — TSH
TSH: 2.72 u[IU]/mL (ref 0.450–4.500)
TSH: 2.83 u[IU]/mL (ref 0.450–4.500)

## 2022-09-12 LAB — HEMOGLOBIN A1C
Est. average glucose Bld gHb Est-mCnc: 137 mg/dL
Hgb A1c MFr Bld: 6.4 % — ABNORMAL HIGH (ref 4.8–5.6)

## 2022-09-12 LAB — SEDIMENTATION RATE: Sed Rate: 34 mm/hr (ref 0–40)

## 2022-09-12 LAB — LIPID PANEL
Chol/HDL Ratio: 2.8 ratio (ref 0.0–4.4)
Cholesterol, Total: 207 mg/dL — ABNORMAL HIGH (ref 100–199)
HDL: 74 mg/dL (ref >39)
LDL Chol Calc (NIH): 106 mg/dL — ABNORMAL HIGH (ref 0–99)
Triglycerides: 158 mg/dL — ABNORMAL HIGH (ref 0–149)
VLDL Cholesterol Cal: 27 mg/dL (ref 5–40)

## 2022-09-12 LAB — C-REACTIVE PROTEIN: CRP: 18 mg/L — ABNORMAL HIGH (ref 0–10)

## 2022-09-12 LAB — ANA: Anti Nuclear Antibody (ANA): NEGATIVE

## 2022-09-12 LAB — RHEUMATOID FACTOR: Rheumatoid fact SerPl-aCnc: 11 IU/mL (ref <14.0)

## 2022-09-12 MED ORDER — METFORMIN HCL 500 MG PO TABS: 1000.0000 mg | ORAL_TABLET | Freq: Two times a day (BID) | ORAL | 1 refills | Status: DC

## 2022-09-12 NOTE — Progress Notes (Signed)
Inflammatory marker elevated. With stiffness and bloating and I would say we can refer to rheumatologist and see if they will accept her for further evaluation of her symptoms.  Hga1c has elevated from 6.3 to 6.4. I would recommend that we increase metformin. I would ideally like her to increase to two tablets in am and one tablet at night for one week then increase to two tablets in am and two tablets at pm for total 1000 mg twice daily. Let's repeat lab only in three months after starting this increase to make sure number is trending down and of course work on diabetic diet and exercise as tolerated.

## 2022-09-16 ENCOUNTER — Ambulatory Visit: Payer: BC Managed Care – PPO | Admitting: Behavioral Health

## 2022-09-16 DIAGNOSIS — F410 Panic disorder [episodic paroxysmal anxiety] without agoraphobia: Secondary | ICD-10-CM | POA: Diagnosis not present

## 2022-09-16 DIAGNOSIS — F411 Generalized anxiety disorder: Secondary | ICD-10-CM | POA: Diagnosis not present

## 2022-09-16 NOTE — Progress Notes (Unsigned)
[facility] Counselor/Therapist Progress Note  Patient ID: IONE MENEAR, MRN: [id number],    Date: 09/18/2022  Time Spent: 60 minutes  Treatment Type: Psychotherapy  Reported Symptoms: Anxiety and depression   Mental Status Exam:  Appearance:   Casual     Behavior:  Appropriate and Sharing  Motor:  Normal  Speech/Language:   Clear and Coherent  Affect:  Appropriate and Congruent  Mood:  normal  Thought process:  normal  Thought content:    WNL  Sensory/Perceptual disturbances:    WNL  Orientation:  oriented to person  Attention:  Good  Concentration:  Good  Memory:  WNL  Fund of knowledge:   Good  Insight:    Good  Judgment:   Good  Impulse Control:  Good   Risk Assessment: Danger to Self:  No Self-injurious Behavior: No Danger to Others: No Duty to Warn:no Physical Aggression / Violence:No  Access to Firearms a concern: No  Gang Involvement:No   Subjective:   The patient reports since last being seen by this counselor she is doing "Okay, pretty good last time I think we made a break thru and I been doing good since then". The patient reports expressing her feelings in the last session was helpful regarding grief and loss of her deceased loved ones.The patient states she is working thru the grief process since last being seen. She reports belief that she is in the anger stage of her grief and loss process. She reports attempting to stay in the present moment. She states she is medication compliant and states it is helping. She reports her medication has been adjusted she denied experiencing any side effects. The patient states "I understand depression and I realize I need to fight thru it". She reports feeling the saddest at night when thinking about her deceased family. She reports what she miss the most about her deceased loved ones is "Seeing them". The patient rated her depression at a 6 today and rated anxiety at a 7 on a scale of 1 to 10 with 10 being severe. She  reports coping by utilizing deep breathing exercises and counting. She identified her last episode of experiencing a panic attack. She reports "Sometimes I have them before work and work is a Retail banker for me". She reports applying to other jobs and states she is waiting to receive feedback.   The patient expressed gratitude with potentially being able to volunteer at a local hospital to receive patient advocacy experience. The patient reports "I am feeling more positive about everything". She reports feeling better about her younger daughters mental health. She states her youngest daughter recently moved in with her oldest daughter due to obtaining a new job. The patient reports she is attempting to explore finding herself.  She states practicing self care by getting outside walking and socializing spending time with friends. The patient reports to continue to have trouble sleeping she states "I can't turn my brain off and have a hard time relaxing if I don't have my sleep aid". She reports experiencing racing thoughts such as "Thinking about the girls what are they doing, thinking about work". She reports to cope by taking a shower to try to relax, and breathing exercises. The patient identified physical symptoms of anxiety as muscle tension, increased heart rate, trembling, shaking, agitation, avoidance escape flight if around a lot of people, perceived negative evaluations by others, frightening thoughts or images, poor concentration, poor memory, feel nervous and wound up. She  reports "My medication takes care of a lot of this (anxiety symptoms)". The patient identified her take away from today's session as "The relaxation techniques definitely, putting my thoughts on trial. Saying is this real or am I fabricating in my mind". The patient denied SI/HI, AH/VH.   Counselor conducted check in. Counselor discussed mental health symptoms experienced and requested for the patient to rate the symptoms. Counselor  questioned coping strategies utilized. Counselor discussed practicing self care. Counselor discussed medication compliance. Counselor educated the patient on the fight/flight/freeze response. Counselor discussed coping strategies to include challenging irrational thoughts, imagery, deep breathing and progressive muscle relaxation. Counselor demonstrated the progressive muscle relaxation technique in session with the patients participation and cooperation. Counselor informed the patient her time is limited with [facility]. Counselor assessed for SI/HI, AH/VH.    Interventions: Cognitive Behavioral Therapy  Diagnosis:   ICD-10-CM   1. Generalized anxiety disorder with panic attacks  F41.1    F41.0       Plan:   1) Long Term Goal:  Begin a healthy grieving process around the loss of her father, mother and brother.     Short Term Goal:  Explore and resolve grief and loss issues.     Objective: Identify what stages of grief have been experienced in the continuum of the grieving process.     Objective: Begin verbalizing feelings associated with the loss.    2) Long Term Goal: Alleviate depressive symptoms and return to the previous level of effective functioning.     Short Term Goal: Improve overall mood, personal hygiene and attentiveness to appropriate self-care.     Objective: Describe mood state, energy level, amount of control over thoughts, and sleeping pattern.     Objective: Identify and replace thoughts and behaviors that trigger depressive symptoms.      Objective: Make short and simple to do list and complete three task each day.    3) Long Term Goal: Stabilize anxiety level while increasing the ability to function on a daily basis.      Short Term Goal: Develop strategies to reduce symptoms, or reduce anxiety and improve coping skills.     Objective: Reduce the frequency, intensity and duration of panic attacks.     Objective: Identify, challenge and replace  biased, fearful self-talk with reality-based, positive self-talk.      Jannifer Hick, [facility]

## 2022-09-18 ENCOUNTER — Encounter: Payer: Self-pay | Admitting: Behavioral Health

## 2022-09-19 ENCOUNTER — Telehealth: Payer: Self-pay | Admitting: Behavioral Health

## 2022-09-19 DIAGNOSIS — F909 Attention-deficit hyperactivity disorder, unspecified type: Secondary | ICD-10-CM

## 2022-09-19 DIAGNOSIS — F319 Bipolar disorder, unspecified: Secondary | ICD-10-CM

## 2022-09-19 DIAGNOSIS — F411 Generalized anxiety disorder: Secondary | ICD-10-CM

## 2022-09-19 MED ORDER — CARIPRAZINE HCL 3 MG PO CAPS: 3.0000 mg | ORAL_CAPSULE | Freq: Every day | ORAL | 0 refills | Status: DC

## 2022-09-19 MED ORDER — SERTRALINE HCL 100 MG PO TABS: ORAL_TABLET | ORAL | 0 refills | Status: DC

## 2022-09-19 NOTE — Telephone Encounter (Signed)
Sent!

## 2022-09-19 NOTE — Telephone Encounter (Signed)
Pt called and said that she needs a  refill of her zoloft 90 day supply sent to the [facility] on [address] in [place]. She also said that due to insurance she needs a 90 day supply of vraylar.

## 2022-10-16 ENCOUNTER — Ambulatory Visit: Payer: BC Managed Care – PPO | Admitting: Behavioral Health

## 2022-10-21 ENCOUNTER — Encounter: Payer: Self-pay | Admitting: Behavioral Health

## 2022-10-21 ENCOUNTER — Ambulatory Visit: Payer: BC Managed Care – PPO | Admitting: Behavioral Health

## 2022-10-21 DIAGNOSIS — F319 Bipolar disorder, unspecified: Secondary | ICD-10-CM

## 2022-10-21 DIAGNOSIS — F909 Attention-deficit hyperactivity disorder, unspecified type: Secondary | ICD-10-CM | POA: Diagnosis not present

## 2022-10-21 DIAGNOSIS — F410 Panic disorder [episodic paroxysmal anxiety] without agoraphobia: Secondary | ICD-10-CM

## 2022-10-21 DIAGNOSIS — F411 Generalized anxiety disorder: Secondary | ICD-10-CM

## 2022-10-21 DIAGNOSIS — M170 Bilateral primary osteoarthritis of knee: Secondary | ICD-10-CM | POA: Diagnosis not present

## 2022-10-21 DIAGNOSIS — M7581 Other shoulder lesions, right shoulder: Secondary | ICD-10-CM | POA: Diagnosis not present

## 2022-10-21 MED ORDER — CARIPRAZINE HCL 3 MG PO CAPS: 3.0000 mg | ORAL_CAPSULE | Freq: Every day | ORAL | 1 refills | Status: DC

## 2022-10-21 MED ORDER — ZOLPIDEM TARTRATE 10 MG PO TABS: 10.0000 mg | ORAL_TABLET | Freq: Every evening | ORAL | 0 refills | Status: DC | PRN

## 2022-10-21 MED ORDER — AMPHETAMINE-DEXTROAMPHET ER 20 MG PO CP24: 20.0000 mg | ORAL_CAPSULE | Freq: Every day | ORAL | 0 refills | Status: DC

## 2022-10-21 MED ORDER — SERTRALINE HCL 100 MG PO TABS: 200.0000 mg | ORAL_TABLET | Freq: Every day | ORAL | 1 refills | Status: DC

## 2022-10-21 NOTE — Progress Notes (Signed)
[facility]  Patient ID: Dominique Nunez,  MRN: [id number]  PCP: Mort Sawyers, FNP  Date of Evaluation: 10/21/2022 Time spent:30 minutes  Chief Complaint:  Chief Complaint   Anxiety; Depression; Follow-up; Patient Education; Medication Refill     HISTORY/CURRENT STATUS: HPI "Dominique Nunez", 57 year old female presents to this office for follow up  and medication management. Says that she feels about 70 % better since starting Vraylar. She is still experiencing some low level depression.  Depression has lessened from to 6/10 and anxiety is 3/10.  Requesting medication adjustment this visit. Denies side-effects with Vraylar. She denies mania, no psychosis, no auditory or visual hallucinations.  No SI or HI.     Individual Medical History/ Review of Systems: Changes? :No   Allergies: Patient has no known allergies.  Current Medications:  Current Outpatient Medications:    sertraline (ZOLOFT) 100 MG tablet, Take 2 tablets (200 mg total) by mouth daily., Disp: 180 tablet, Rfl: 1   amphetamine-dextroamphetamine (ADDERALL XR) 20 MG 24 hr capsule, Take 1 capsule (20 mg total) by mouth daily., Disp: 30 capsule, Rfl: 0   buPROPion (WELLBUTRIN XL) 300 MG 24 hr tablet, Take 1 tablet (300 mg total) by mouth daily., Disp: 90 tablet, Rfl: 1   cariprazine (VRAYLAR) 3 MG capsule, Take 1 capsule (3 mg total) by mouth daily., Disp: 90 capsule, Rfl: 1   cetirizine (ZYRTEC) 10 MG tablet, Take 10 mg by mouth daily., Disp: , Rfl:    clonazePAM (KLONOPIN) 0.5 MG disintegrating tablet, Take 1 tablet (0.5 mg total) by mouth daily as needed., Disp: 30 tablet, Rfl: 3   DOTTI 0.1 MG/24HR patch, Place onto the skin 2 (two) times a week., Disp: , Rfl:    fluticasone (FLONASE) 50 MCG/ACT nasal spray, Place 1 spray into both nostrils 2 (two) times daily., Disp: 16 g, Rfl: 5   ibuprofen (ADVIL,MOTRIN) 200 MG tablet, Take 800 mg by mouth daily as needed for moderate pain., Disp: , Rfl:    Ivermectin (SOOLANTRA)  1 % CREA, Soolantra 1 % topical cream, Disp: , Rfl:    levothyroxine (SYNTHROID) 88 MCG tablet, Take 1 tablet (88 mcg total) by mouth daily. Will need to be seen in office for more refills., Disp: 90 tablet, Rfl: 0   metFORMIN (GLUCOPHAGE) 500 MG tablet, Take 2 tablets (1,000 mg total) by mouth 2 (two) times daily with a meal., Disp: 360 tablet, Rfl: 1   omeprazole (PRILOSEC) 20 MG capsule, Take 1 capsule (20 mg total) by mouth daily., Disp: 30 capsule, Rfl: 3   rizatriptan (MAXALT) 10 MG tablet, TAKE 1 TABLET BY MOUTH AS NEEDED FOR MIGRAINE. MAY REPEAT IN 2  HOURS IF NEEDED, Disp: 10 tablet, Rfl: 0   sertraline (ZOLOFT) 100 MG tablet, TAKE 1 AND 1/2 TABLETS(150 MG) BY MOUTH DAILY, Disp: 135 tablet, Rfl: 0   zolpidem (AMBIEN) 10 MG tablet, Take 1 tablet (10 mg total) by mouth at bedtime as needed for sleep., Disp: 30 tablet, Rfl: 0 Medication Side Effects: none  Family Medical/ Social History: Changes? No  MENTAL HEALTH EXAM:  There were no vitals taken for this visit.There is no height or weight on file to calculate BMI.  General Appearance: Casual, Neat, and Well Groomed  Eye Contact:  Good  Speech:  Clear and Coherent  Volume:  Normal  Mood:  Depressed  Affect:  Appropriate  Thought Process:  Coherent  Orientation:  Full (Time, Place, and Person)  Thought Content: Logical   Suicidal Thoughts:  No  Homicidal Thoughts:  No  Memory:  WNL  Judgement:  Good  Insight:  Good  Psychomotor Activity:  Normal  Concentration:  Concentration: Good  Recall:  Good  Fund of Knowledge: Good  Language: Good  Assets:  Desire for Improvement  ADL's:  Intact  Cognition: WNL  Prognosis:  Good    DIAGNOSES:    ICD-10-CM   1. Bipolar 1 disorder, depressed  F31.9 cariprazine (VRAYLAR) 3 MG capsule    sertraline (ZOLOFT) 100 MG tablet    2. Attention deficit hyperactivity disorder (ADHD), unspecified ADHD type  F90.9 amphetamine-dextroamphetamine (ADDERALL XR) 20 MG 24 hr capsule    3.  Generalized anxiety disorder with panic attacks  F41.1 cariprazine (VRAYLAR) 3 MG capsule   F41.0 sertraline (ZOLOFT) 100 MG tablet      Receiving Psychotherapy: No    RECOMMENDATIONS:  Greater than 50% of  30 min face to face time with patient was spent on counseling and coordination of care. Discussed her current stability. Still feeling a little dull, and depressed, we discussed increasing her Sertraline. Increase  Zoloft to 200 mg;  Continue Vraylar 3 mg daily Continue Ambien 10 mg at bedtime Continue Adderall 20 mg ER daily To call for worsening symptoms or side effects Provided emergency contact information Reviewed PDMP      Joan Flores, NP

## 2022-10-22 ENCOUNTER — Telehealth: Payer: Self-pay | Admitting: Family

## 2022-10-22 DIAGNOSIS — E039 Hypothyroidism, unspecified: Secondary | ICD-10-CM

## 2022-10-22 NOTE — Telephone Encounter (Signed)
Prescription Request  10/22/2022  LOV: 09/11/2022  What is the name of the medication or equipment?  levothyroxine (SYNTHROID) 88 MCG tablet  Have you contacted your pharmacy to request a refill? Yes   Which pharmacy would you like this sent to?  [id number] [place], [place] - [address] AT [facility] OF [address] [place] [address] Phone: [phone] Fax: [phone]  Patient requested a 90 day supply if possible.  Patient notified that their request is being sent to the clinical staff for review and that they should receive a response within 2 business days.   Please advise at Mobile [phone] (mobile)

## 2022-10-23 MED ORDER — LEVOTHYROXINE SODIUM 88 MCG PO TABS: 88.0000 ug | ORAL_TABLET | Freq: Every day | ORAL | 3 refills | Status: DC

## 2022-11-25 ENCOUNTER — Other Ambulatory Visit: Payer: Self-pay | Admitting: Behavioral Health

## 2022-11-25 ENCOUNTER — Telehealth: Payer: Self-pay | Admitting: Behavioral Health

## 2022-11-25 DIAGNOSIS — F909 Attention-deficit hyperactivity disorder, unspecified type: Secondary | ICD-10-CM

## 2022-11-25 MED ORDER — AMPHETAMINE-DEXTROAMPHET ER 20 MG PO CP24: 20.0000 mg | ORAL_CAPSULE | Freq: Every day | ORAL | 0 refills | Status: DC

## 2022-11-25 NOTE — Telephone Encounter (Signed)
Pt called and said that she needs a refill on her adderall xr 20 mg. Pharmacy is [facility] on [address]

## 2022-12-02 ENCOUNTER — Other Ambulatory Visit: Payer: Self-pay | Admitting: Family

## 2022-12-02 DIAGNOSIS — R7303 Prediabetes: Secondary | ICD-10-CM

## 2022-12-02 NOTE — Progress Notes (Unsigned)
A1c

## 2022-12-09 ENCOUNTER — Other Ambulatory Visit: Payer: Self-pay | Admitting: Behavioral Health

## 2022-12-09 DIAGNOSIS — F331 Major depressive disorder, recurrent, moderate: Secondary | ICD-10-CM

## 2022-12-15 ENCOUNTER — Other Ambulatory Visit (INDEPENDENT_AMBULATORY_CARE_PROVIDER_SITE_OTHER): Payer: BC Managed Care – PPO

## 2022-12-15 DIAGNOSIS — R7303 Prediabetes: Secondary | ICD-10-CM | POA: Diagnosis not present

## 2022-12-16 ENCOUNTER — Ambulatory Visit: Payer: BC Managed Care – PPO | Admitting: Behavioral Health

## 2022-12-16 ENCOUNTER — Other Ambulatory Visit: Payer: Self-pay | Admitting: Family

## 2022-12-16 DIAGNOSIS — E118 Type 2 diabetes mellitus with unspecified complications: Secondary | ICD-10-CM

## 2022-12-16 LAB — HEMOGLOBIN A1C
Est. average glucose Bld gHb Est-mCnc: 143 mg/dL
Hgb A1c MFr Bld: 6.6 % — ABNORMAL HIGH (ref 4.8–5.6)

## 2022-12-16 MED ORDER — METFORMIN HCL 1000 MG PO TABS: 1000.0000 mg | ORAL_TABLET | Freq: Two times a day (BID) | ORAL | 3 refills | Status: DC

## 2022-12-23 ENCOUNTER — Encounter: Payer: Self-pay | Admitting: Family

## 2022-12-29 ENCOUNTER — Ambulatory Visit: Payer: BC Managed Care – PPO | Admitting: Behavioral Health

## 2022-12-29 ENCOUNTER — Encounter: Payer: Self-pay | Admitting: Behavioral Health

## 2022-12-29 DIAGNOSIS — F5105 Insomnia due to other mental disorder: Secondary | ICD-10-CM | POA: Diagnosis not present

## 2022-12-29 DIAGNOSIS — F411 Generalized anxiety disorder: Secondary | ICD-10-CM

## 2022-12-29 DIAGNOSIS — F410 Panic disorder [episodic paroxysmal anxiety] without agoraphobia: Secondary | ICD-10-CM

## 2022-12-29 DIAGNOSIS — F99 Mental disorder, not otherwise specified: Secondary | ICD-10-CM | POA: Diagnosis not present

## 2022-12-29 DIAGNOSIS — F319 Bipolar disorder, unspecified: Secondary | ICD-10-CM

## 2022-12-29 MED ORDER — CARIPRAZINE HCL 3 MG PO CAPS: 3.0000 mg | ORAL_CAPSULE | Freq: Every day | ORAL | 1 refills | Status: DC

## 2022-12-29 MED ORDER — SERTRALINE HCL 100 MG PO TABS: 200.0000 mg | ORAL_TABLET | Freq: Every day | ORAL | 1 refills | Status: DC

## 2022-12-29 MED ORDER — AUVELITY 45-105 MG PO TBCR
EXTENDED_RELEASE_TABLET | ORAL | 1 refills | Status: DC
Start: 2022-12-29 — End: 2023-02-02

## 2022-12-29 MED ORDER — ZOLPIDEM TARTRATE 10 MG PO TABS: 10.0000 mg | ORAL_TABLET | Freq: Every evening | ORAL | 0 refills | Status: DC | PRN

## 2022-12-29 NOTE — Progress Notes (Signed)
[facility]  Patient ID: Dominique Nunez,  MRN: [id number]  PCP: Mort Sawyers, FNP  Date of Evaluation: 12/29/2022 Time spent:30 minutes  Chief Complaint:  Chief Complaint   Anxiety; Depression; Follow-up; Medication Refill; Medication Problem; Patient Education     HISTORY/CURRENT STATUS: HPI "Dominique Nunez", 57 year old female presents to this office for follow up  and medication management. Says she is still depressed and unlike last visit when expressing she was 70% better she says she has declined again.  Reports depression  at  7/10 and anxiety is 3/10.  Requesting medication adjustment this visit. Denies side-effects with Vraylar. She denies mania, no psychosis, no auditory or visual hallucinations.  No SI or HI.     Individual Medical History/ Review of Systems: Changes? :No   Allergies: Patient has no known allergies.  Current Medications:  Current Outpatient Medications:    Dextromethorphan-buPROPion ER (AUVELITY) 45-105 MG TBCR, Take one tablet by mouth for 3 days, then take two tablets daily 7-8 hours between doses., Disp: 60 tablet, Rfl: 1   amphetamine-dextroamphetamine (ADDERALL XR) 20 MG 24 hr capsule, Take 1 capsule (20 mg total) by mouth daily., Disp: 30 capsule, Rfl: 0   cariprazine (VRAYLAR) 3 MG capsule, Take 1 capsule (3 mg total) by mouth daily., Disp: 90 capsule, Rfl: 1   cetirizine (ZYRTEC) 10 MG tablet, Take 10 mg by mouth daily., Disp: , Rfl:    clonazePAM (KLONOPIN) 0.5 MG disintegrating tablet, Take 1 tablet (0.5 mg total) by mouth daily as needed., Disp: 30 tablet, Rfl: 3   DOTTI 0.1 MG/24HR patch, Place onto the skin 2 (two) times a week., Disp: , Rfl:    fluticasone (FLONASE) 50 MCG/ACT nasal spray, Place 1 spray into both nostrils 2 (two) times daily., Disp: 16 g, Rfl: 5   ibuprofen (ADVIL,MOTRIN) 200 MG tablet, Take 800 mg by mouth daily as needed for moderate pain., Disp: , Rfl:    Ivermectin (SOOLANTRA) 1 % CREA, Soolantra 1 % topical cream,  Disp: , Rfl:    levothyroxine (SYNTHROID) 88 MCG tablet, Take 1 tablet (88 mcg total) by mouth daily., Disp: 90 tablet, Rfl: 3   metFORMIN (GLUCOPHAGE) 1000 MG tablet, Take 1 tablet (1,000 mg total) by mouth 2 (two) times daily with a meal., Disp: 180 tablet, Rfl: 3   omeprazole (PRILOSEC) 20 MG capsule, Take 1 capsule (20 mg total) by mouth daily., Disp: 30 capsule, Rfl: 3   rizatriptan (MAXALT) 10 MG tablet, TAKE 1 TABLET BY MOUTH AS NEEDED FOR MIGRAINE. MAY REPEAT IN 2  HOURS IF NEEDED, Disp: 10 tablet, Rfl: 0   sertraline (ZOLOFT) 100 MG tablet, TAKE 1 AND 1/2 TABLETS(150 MG) BY MOUTH DAILY, Disp: 135 tablet, Rfl: 0   sertraline (ZOLOFT) 100 MG tablet, Take 2 tablets (200 mg total) by mouth daily., Disp: 180 tablet, Rfl: 1   zolpidem (AMBIEN) 10 MG tablet, Take 1 tablet (10 mg total) by mouth at bedtime as needed for sleep., Disp: 30 tablet, Rfl: 0 Medication Side Effects: none  Family Medical/ Social History: Changes? No  MENTAL HEALTH EXAM:  There were no vitals taken for this visit.There is no height or weight on file to calculate BMI.  General Appearance: Casual, Neat, and Well Groomed  Eye Contact:  Good  Speech:  Clear and Coherent  Volume:  Normal  Mood:  Anxious, Depressed, and Dysphoric  Affect:  Appropriate, Depressed, Flat, and Anxious  Thought Process:  Coherent  Orientation:  Full (Time, Place, and Person)  Thought  Content: Logical   Suicidal Thoughts:  No  Homicidal Thoughts:  No  Memory:  WNL  Judgement:  Good  Insight:  Good  Psychomotor Activity:  Normal  Concentration:  Concentration: Good  Recall:  Good  Fund of Knowledge: Good  Language: Good  Assets:  Desire for Improvement  ADL's:  Intact  Cognition: WNL  Prognosis:  Good    DIAGNOSES:    ICD-10-CM   1. Insomnia due to other mental disorder  F51.05 zolpidem (AMBIEN) 10 MG tablet   F99     2. Bipolar 1 disorder, depressed (HCC)  F31.9 cariprazine (VRAYLAR) 3 MG capsule    sertraline (ZOLOFT) 100  MG tablet    Dextromethorphan-buPROPion ER (AUVELITY) 45-105 MG TBCR    3. Generalized anxiety disorder with panic attacks  F41.1 cariprazine (VRAYLAR) 3 MG capsule   F41.0 sertraline (ZOLOFT) 100 MG tablet      Receiving Psychotherapy: No    RECOMMENDATIONS:   Greater than 50% of  30 min face to face time with patient was spent on counseling and coordination of care. Discussed her current stability. Still feeling a little dull, and depressed.  Continue Vraylar 3 mg daily Continue Sertraline 200 mg daily To start Auvelity 45-105  mg for three days, then 90-210 mg daily. Provided sample Sent RX to my scripts.  Continue Ambien 10 mg at bedtime Continue Adderall 20 mg ER daily To call for worsening symptoms or side effects Provided emergency contact information Reviewed PDMP     Joan Flores, NP

## 2022-12-30 ENCOUNTER — Encounter: Payer: Self-pay | Admitting: Family

## 2022-12-30 DIAGNOSIS — R12 Heartburn: Secondary | ICD-10-CM

## 2022-12-30 DIAGNOSIS — E118 Type 2 diabetes mellitus with unspecified complications: Secondary | ICD-10-CM

## 2022-12-30 NOTE — Telephone Encounter (Signed)
Left message to return call to our office.  

## 2022-12-30 NOTE — Telephone Encounter (Signed)
How often do you want patient to test? I will send in supplies if ok.

## 2022-12-30 NOTE — Telephone Encounter (Signed)
Please have her decrease metformin to initial dosing prior to increase.  Have her make appt to be assessed for potential poison ivy. We will discuss metformin further at that visit.

## 2023-01-02 NOTE — Telephone Encounter (Signed)
Patient called back she will go back to original metformin dose. Rash has gone away but I have set up follow up in office in 3 weeks to go over metformin and any questions. Patient had another message open about getting a glucometer. Reviewed with her she would like sent in as well.

## 2023-01-02 NOTE — Telephone Encounter (Signed)
Left message and sent my chart for patient to call office.

## 2023-01-02 NOTE — Telephone Encounter (Signed)
Patient would like to get one sent in just to have on hand. Ok to send in as check once a day.

## 2023-01-05 DIAGNOSIS — E118 Type 2 diabetes mellitus with unspecified complications: Secondary | ICD-10-CM | POA: Insufficient documentation

## 2023-01-05 MED ORDER — BLOOD GLUCOSE TEST VI STRP
ORAL_STRIP | 0 refills | Status: AC
Start: 2023-01-05 — End: ?

## 2023-01-05 MED ORDER — BLOOD GLUCOSE MONITORING SUPPL DEVI
0 refills | Status: DC
Start: 2023-01-05 — End: 2023-01-27

## 2023-01-05 MED ORDER — LANCETS MISC
0 refills | Status: AC
Start: 2023-01-05 — End: ?

## 2023-01-05 MED ORDER — OMEPRAZOLE 20 MG PO CPDR
20.0000 mg | DELAYED_RELEASE_CAPSULE | Freq: Every day | ORAL | 3 refills | Status: AC
Start: 2023-01-05 — End: ?

## 2023-01-05 NOTE — Telephone Encounter (Signed)
Noted  

## 2023-01-05 NOTE — Addendum Note (Signed)
Addended by: Mort Sawyers on: 01/05/2023 02:16 PM   Modules accepted: Orders

## 2023-01-05 NOTE — Addendum Note (Signed)
Addended by: Mort Sawyers on: 01/05/2023 03:15 PM   Modules accepted: Orders

## 2023-01-08 ENCOUNTER — Other Ambulatory Visit: Payer: Self-pay

## 2023-01-08 ENCOUNTER — Telehealth: Payer: Self-pay | Admitting: Behavioral Health

## 2023-01-08 DIAGNOSIS — F909 Attention-deficit hyperactivity disorder, unspecified type: Secondary | ICD-10-CM

## 2023-01-08 MED ORDER — AMPHETAMINE-DEXTROAMPHET ER 20 MG PO CP24
20.0000 mg | ORAL_CAPSULE | Freq: Every day | ORAL | 0 refills | Status: DC
Start: 2023-01-08 — End: 2023-04-20

## 2023-01-08 NOTE — Telephone Encounter (Signed)
Pended.

## 2023-01-08 NOTE — Telephone Encounter (Signed)
Dominique Nunez called at 3;35 to request refill of her Adderall XR 20.  Appt 7/22. Send to [facility] #[id number] - [place], [place] - [address]

## 2023-01-22 ENCOUNTER — Telehealth: Payer: Self-pay

## 2023-01-22 NOTE — Telephone Encounter (Signed)
Prior Authorization submitted for [employer] per request to [employer] [facility]

## 2023-01-22 NOTE — Telephone Encounter (Signed)
Prior Approval received effective 01/22/2023-01/22/2024, ZO#[id number]. For Auvelity 45-105 mg

## 2023-01-27 ENCOUNTER — Ambulatory Visit: Payer: BC Managed Care – PPO | Admitting: Family

## 2023-01-27 ENCOUNTER — Encounter: Payer: Self-pay | Admitting: Family

## 2023-01-27 VITALS — BP 120/80 | HR 75 | Temp 98.2°F | Ht 62.0 in | Wt 264.0 lb

## 2023-01-27 DIAGNOSIS — E118 Type 2 diabetes mellitus with unspecified complications: Secondary | ICD-10-CM

## 2023-01-27 DIAGNOSIS — Z7985 Long-term (current) use of injectable non-insulin antidiabetic drugs: Secondary | ICD-10-CM

## 2023-01-27 DIAGNOSIS — Z7984 Long term (current) use of oral hypoglycemic drugs: Secondary | ICD-10-CM

## 2023-01-27 MED ORDER — METFORMIN HCL ER 750 MG PO TB24
750.0000 mg | ORAL_TABLET | Freq: Two times a day (BID) | ORAL | 1 refills | Status: DC
Start: 2023-01-27 — End: 2024-01-13

## 2023-01-27 MED ORDER — OZEMPIC (0.25 OR 0.5 MG/DOSE) 2 MG/3ML ~~LOC~~ SOPN
0.2500 mg | PEN_INJECTOR | SUBCUTANEOUS | 2 refills | Status: DC
Start: 2023-01-27 — End: 2024-01-13

## 2023-01-27 NOTE — Assessment & Plan Note (Signed)
Start ozempic 0.25 mg weekly  Change metformin to XR 750 mg  Work on diabetic diet exercise as tolerated.

## 2023-01-27 NOTE — Progress Notes (Signed)
Established Patient Office Visit  Subjective:      CC:  Chief Complaint  Patient presents with   Diabetes    HPI: Dominique Nunez is a 57 y.o. female presenting on 01/27/2023 for Diabetes . DM2:  Lab Results  Component Value Date   HGBA1C 6.6 (H) 12/15/2022  New dx of diabetes 1 month ago. Prior to that had been prediabetic. Advised last visit to increase metformin to 1000 mg twice daily. She states however too much GI upset taking 1000 mg QAM and 500 mg at night, still hurts her stomach.   HLD:  Lab Results  Component Value Date   CHOL 207 (H) 09/11/2022   HDL 74 09/11/2022   LDLCALC 106 (H) 09/11/2022   LDLDIRECT 140.0 04/10/2022   TRIG 158 (H) 09/11/2022   CHOLHDL 2.8 09/11/2022    Pt inquiring about gLP 1 for diabetes, she denies known history personal or family for thyroid cancer. Had constipation when she was younger but not any more.    Social history:  Relevant past medical, surgical, family and social history reviewed and updated as indicated. Interim medical history since our last visit reviewed.  Allergies and medications reviewed and updated.  DATA REVIEWED: CHART IN EPIC     ROS: Negative unless specifically indicated above in HPI.    Current Outpatient Medications:    amphetamine-dextroamphetamine (ADDERALL XR) 20 MG 24 hr capsule, Take 1 capsule (20 mg total) by mouth daily., Disp: 30 capsule, Rfl: 0   Blood Glucose Monitoring Suppl (ACCU-CHEK GUIDE ME) w/Device KIT, See admin instructions. follow package directions, Disp: , Rfl:    cariprazine (VRAYLAR) 3 MG capsule, Take 1 capsule (3 mg total) by mouth daily., Disp: 90 capsule, Rfl: 1   cetirizine (ZYRTEC) 10 MG tablet, Take 10 mg by mouth daily., Disp: , Rfl:    clonazePAM (KLONOPIN) 0.5 MG disintegrating tablet, Take 1 tablet (0.5 mg total) by mouth daily as needed., Disp: 30 tablet, Rfl: 3   Dextromethorphan-buPROPion ER (AUVELITY) 45-105 MG TBCR, Take one tablet by mouth for 3 days, then  take two tablets daily 7-8 hours between doses., Disp: 60 tablet, Rfl: 1   DOTTI 0.1 MG/24HR patch, Place onto the skin 2 (two) times a week., Disp: , Rfl:    fluticasone (FLONASE) 50 MCG/ACT nasal spray, Place 1 spray into both nostrils 2 (two) times daily., Disp: 16 g, Rfl: 5   Glucose Blood (BLOOD GLUCOSE TEST STRIPS) STRP, Check glucose prn symptoms May substitute to any manufacturer covered by patient's insurance., Disp: 100 strip, Rfl: 0   ibuprofen (ADVIL,MOTRIN) 200 MG tablet, Take 800 mg by mouth daily as needed for moderate pain., Disp: , Rfl:    Ivermectin (SOOLANTRA) 1 % CREA, Soolantra 1 % topical cream, Disp: , Rfl:    Lancets MISC, Check glucose po every day prn symptoms Defer to any manufacturer covered with pt insurance, Disp: 100 each, Rfl: 0   levothyroxine (SYNTHROID) 88 MCG tablet, Take 1 tablet (88 mcg total) by mouth daily., Disp: 90 tablet, Rfl: 3   metFORMIN (GLUCOPHAGE-XR) 750 MG 24 hr tablet, Take 1 tablet (750 mg total) by mouth in the morning and at bedtime., Disp: 180 tablet, Rfl: 1   omeprazole (PRILOSEC) 20 MG capsule, Take 1 capsule (20 mg total) by mouth daily., Disp: 90 capsule, Rfl: 3   rizatriptan (MAXALT) 10 MG tablet, TAKE 1 TABLET BY MOUTH AS NEEDED FOR MIGRAINE. MAY REPEAT IN 2  HOURS IF NEEDED, Disp: 10 tablet, Rfl: 0  Semaglutide,0.25 or 0.5MG /DOS, (OZEMPIC, 0.25 OR 0.5 MG/DOSE,) 2 MG/3ML SOPN, Inject 0.25 mg into the skin once a week., Disp: 3 mL, Rfl: 2   sertraline (ZOLOFT) 100 MG tablet, TAKE 1 AND 1/2 TABLETS(150 MG) BY MOUTH DAILY, Disp: 135 tablet, Rfl: 0   sertraline (ZOLOFT) 100 MG tablet, Take 2 tablets (200 mg total) by mouth daily., Disp: 180 tablet, Rfl: 1   zolpidem (AMBIEN) 10 MG tablet, Take 1 tablet (10 mg total) by mouth at bedtime as needed for sleep., Disp: 30 tablet, Rfl: 0      Objective:    BP 120/80 (BP Location: Left Arm, Patient Position: Sitting, Cuff Size: Large)   Pulse 75   Temp 98.2 F (36.8 C) (Temporal)   Ht 5\' 2"   (1.575 m)   Wt 264 lb (119.7 kg)   SpO2 96%   BMI 48.29 kg/m   Wt Readings from Last 3 Encounters:  01/27/23 264 lb (119.7 kg)  09/11/22 258 lb 6.4 oz (117.2 kg)  06/19/22 256 lb (116.1 kg)    Physical Exam Constitutional:      General: She is not in acute distress.    Appearance: Normal appearance. She is normal weight. She is not ill-appearing, toxic-appearing or diaphoretic.  HENT:     Head: Normocephalic.  Cardiovascular:     Rate and Rhythm: Normal rate.  Pulmonary:     Effort: Pulmonary effort is normal.  Musculoskeletal:        General: Normal range of motion.  Neurological:     General: No focal deficit present.     Mental Status: She is alert and oriented to person, place, and time. Mental status is at baseline.  Psychiatric:        Mood and Affect: Mood normal.        Behavior: Behavior normal.        Thought Content: Thought content normal.        Judgment: Judgment normal.           Assessment & Plan:  Controlled type 2 diabetes mellitus with complication, without long-term current use of insulin (HCC) Assessment & Plan: Start ozempic 0.25 mg weekly  Change metformin to XR 750 mg  Work on diabetic diet exercise as tolerated.   Orders: -     metFORMIN HCl ER; Take 1 tablet (750 mg total) by mouth in the morning and at bedtime.  Dispense: 180 tablet; Refill: 1 -     Ozempic (0.25 or 0.5 MG/DOSE); Inject 0.25 mg into the skin once a week.  Dispense: 3 mL; Refill: 2     Return in about 3 months (around 04/29/2023).  Mort Sawyers, MSN, APRN, FNP-C [facility]

## 2023-01-29 NOTE — Progress Notes (Signed)
noted 

## 2023-02-02 ENCOUNTER — Ambulatory Visit: Payer: BC Managed Care – PPO | Admitting: Behavioral Health

## 2023-02-02 ENCOUNTER — Encounter: Payer: Self-pay | Admitting: Behavioral Health

## 2023-02-02 DIAGNOSIS — F5105 Insomnia due to other mental disorder: Secondary | ICD-10-CM | POA: Diagnosis not present

## 2023-02-02 DIAGNOSIS — F319 Bipolar disorder, unspecified: Secondary | ICD-10-CM | POA: Diagnosis not present

## 2023-02-02 DIAGNOSIS — F99 Mental disorder, not otherwise specified: Secondary | ICD-10-CM | POA: Diagnosis not present

## 2023-02-02 MED ORDER — ZOLPIDEM TARTRATE 10 MG PO TABS
10.0000 mg | ORAL_TABLET | Freq: Every evening | ORAL | 0 refills | Status: DC | PRN
Start: 2023-02-02 — End: 2023-05-05

## 2023-02-02 MED ORDER — AUVELITY 45-105 MG PO TBCR
EXTENDED_RELEASE_TABLET | ORAL | 3 refills | Status: DC
Start: 2023-02-02 — End: 2023-05-05

## 2023-02-02 NOTE — Progress Notes (Signed)
[facility]  Patient ID: Dominique Nunez,  MRN: [id number]  PCP: Mort Sawyers, FNP  Date of Evaluation: 02/02/2023 Time spent:30 minutes  Chief Complaint:  Chief Complaint   Anxiety; Depression; Follow-up; Medication Refill; Patient Education     HISTORY/CURRENT STATUS: HPI  "Dominique Nunez", 57 year old female presents to this office for follow up  and medication management. Says that she really likes Auvelity and feels the medication is working well. She would like to stay on course with this regimen for now..  Reports depression  at  3/10 and anxiety is 3/10.  Requesting medication adjustment this visit. Denies side-effects with Vraylar. She denies mania, no psychosis, no auditory or visual hallucinations.  No SI or HI.      Individual Medical History/ Review of Systems: Changes? :No   Allergies: Patient has no known allergies.  Current Medications:  Current Outpatient Medications:    amphetamine-dextroamphetamine (ADDERALL XR) 20 MG 24 hr capsule, Take 1 capsule (20 mg total) by mouth daily., Disp: 30 capsule, Rfl: 0   Blood Glucose Monitoring Suppl (ACCU-CHEK GUIDE ME) w/Device KIT, See admin instructions. follow package directions, Disp: , Rfl:    cariprazine (VRAYLAR) 3 MG capsule, Take 1 capsule (3 mg total) by mouth daily., Disp: 90 capsule, Rfl: 1   cetirizine (ZYRTEC) 10 MG tablet, Take 10 mg by mouth daily., Disp: , Rfl:    clonazePAM (KLONOPIN) 0.5 MG disintegrating tablet, Take 1 tablet (0.5 mg total) by mouth daily as needed., Disp: 30 tablet, Rfl: 3   Dextromethorphan-buPROPion ER (AUVELITY) 45-105 MG TBCR, Take one tablet by mouth for 3 days, then take two tablets daily 7-8 hours between doses., Disp: 60 tablet, Rfl: 3   DOTTI 0.1 MG/24HR patch, Place onto the skin 2 (two) times a week., Disp: , Rfl:    fluticasone (FLONASE) 50 MCG/ACT nasal spray, Place 1 spray into both nostrils 2 (two) times daily., Disp: 16 g, Rfl: 5   Glucose Blood (BLOOD GLUCOSE TEST  STRIPS) STRP, Check glucose prn symptoms May substitute to any manufacturer covered by patient's insurance., Disp: 100 strip, Rfl: 0   ibuprofen (ADVIL,MOTRIN) 200 MG tablet, Take 800 mg by mouth daily as needed for moderate pain., Disp: , Rfl:    Ivermectin (SOOLANTRA) 1 % CREA, Soolantra 1 % topical cream, Disp: , Rfl:    Lancets MISC, Check glucose po every day prn symptoms Defer to any manufacturer covered with pt insurance, Disp: 100 each, Rfl: 0   levothyroxine (SYNTHROID) 88 MCG tablet, Take 1 tablet (88 mcg total) by mouth daily., Disp: 90 tablet, Rfl: 3   metFORMIN (GLUCOPHAGE-XR) 750 MG 24 hr tablet, Take 1 tablet (750 mg total) by mouth in the morning and at bedtime., Disp: 180 tablet, Rfl: 1   omeprazole (PRILOSEC) 20 MG capsule, Take 1 capsule (20 mg total) by mouth daily., Disp: 90 capsule, Rfl: 3   rizatriptan (MAXALT) 10 MG tablet, TAKE 1 TABLET BY MOUTH AS NEEDED FOR MIGRAINE. MAY REPEAT IN 2  HOURS IF NEEDED, Disp: 10 tablet, Rfl: 0   Semaglutide,0.25 or 0.5MG /DOS, (OZEMPIC, 0.25 OR 0.5 MG/DOSE,) 2 MG/3ML SOPN, Inject 0.25 mg into the skin once a week., Disp: 3 mL, Rfl: 2   sertraline (ZOLOFT) 100 MG tablet, TAKE 1 AND 1/2 TABLETS(150 MG) BY MOUTH DAILY, Disp: 135 tablet, Rfl: 0   sertraline (ZOLOFT) 100 MG tablet, Take 2 tablets (200 mg total) by mouth daily., Disp: 180 tablet, Rfl: 1   zolpidem (AMBIEN) 10 MG tablet, Take 1  tablet (10 mg total) by mouth at bedtime as needed for sleep., Disp: 30 tablet, Rfl: 0 Medication Side Effects: none  Family Medical/ Social History: Changes? No  MENTAL HEALTH EXAM:  There were no vitals taken for this visit.There is no height or weight on file to calculate BMI.  General Appearance: Casual, Neat, and Well Groomed  Eye Contact:  Good  Speech:  Clear and Coherent  Volume:  Normal  Mood:  Anxious, Depressed, and Dysphoric  Affect:  Appropriate  Thought Process:  Coherent  Orientation:  Full (Time, Place, and Person)  Thought Content:  Logical   Suicidal Thoughts:  No  Homicidal Thoughts:  No  Memory:  WNL  Judgement:  Good  Insight:  Good  Psychomotor Activity:  Normal  Concentration:  Concentration: Good  Recall:  Good  Fund of Knowledge: Good  Language: Good  Assets:  Desire for Improvement  ADL's:  Intact  Cognition: WNL  Prognosis:  Good    DIAGNOSES:    ICD-10-CM   1. Insomnia due to other mental disorder  F51.05 zolpidem (AMBIEN) 10 MG tablet   F99     2. Bipolar 1 disorder, depressed (HCC)  F31.9 Dextromethorphan-buPROPion ER (AUVELITY) 45-105 MG TBCR      Receiving Psychotherapy: No    RECOMMENDATIONS:   Greater than 50% of  30 min face to face time with patient was spent on counseling and coordination of care. Discussed her significant improvement with Auvelity since last visit. Reports 70% improvement since last visit.   Continue Vraylar 3 mg daily Continue Sertraline 200 mg daily Continue Auvelity  90-210 mg daily.  Continue Ambien 10 mg at bedtime Continue Adderall 20 mg ER daily To call for worsening symptoms or side effects Provided emergency contact information Reviewed PDMP           Joan Flores, NP

## 2023-02-08 NOTE — Progress Notes (Unsigned)
Office Visit Note  Patient: Dominique Nunez             Date of Birth: 12/03/1965           MRN: [id number]             PCP: Mort Sawyers, FNP Referring: Mort Sawyers, FNP Visit Date: 02/10/2023 Occupation: Computer work, customer service [employer]  Subjective:  New Patient (Initial Visit) (Patient states she has joint pain in her hands, knees, and back pain. )   History of Present Illness: Dominique Nunez is a 57 y.o. female here for evaluation of elevated CRP checked in association with chronic joint pain in multiple areas.  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  Never required any formal physical therapy injection or surgical intervention for this.  Also has longstanding joint pain in both knees has been seeing [facility] for this in the past 6 years.  Notices a lot of clicking and popping especially with deep bending knee movements.  She has increased trouble with climbing stairs due to pain.  Tried several rounds of cortisone injections with benefit more recently had viscosupplementation injections last was about 2 years ago.  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  Amount of pain is typically worse at end of the day and dependent on the amount of use.  She sees visible swelling in her hands and feet usually worse by the end of the day.  No particular redness or discoloration.  She has a family history of osteoarthritis on her mother side.  She does not take any daily oral anti-inflammatory medicines and sometimes uses Tylenol as needed. Besides joint pain she has chronic rash with facial rosacea uses topical ivermectin which is beneficial but usually gets flareups again afterwards.  Has some darkening and some spots in multiple areas but no other erythematous rashes. Denies any  history of oral nasal ulcers, chronic lymphadenopathy, focal alopecia, or Raynaud's symptoms.  Does not have any history of abnormal bleeding or blood clots. Had hysterectomy at age 21 due to prolapse after her second pregnancy.  Had bilateral oophorectomy about 6 years ago.  Labs reviewed CRP 18 ANA neg RF neg ESR 34   Activities of Daily Living:  Patient reports morning stiffness for 30 minutes.   Patient Denies nocturnal pain.  Difficulty dressing/grooming: Denies Difficulty climbing stairs: Reports Difficulty getting out of chair: Reports Difficulty using hands for taps, buttons, cutlery, and/or writing: Reports  Review of Systems  Constitutional:  Positive for fatigue.  HENT:  Negative for mouth sores and mouth dryness.   Eyes:  Negative for dryness.  Respiratory:  Positive for shortness of breath.   Cardiovascular:  Negative for chest pain and palpitations.  Gastrointestinal:  Negative for blood in stool, constipation and diarrhea.  Endocrine: Negative for increased urination.  Genitourinary:  Positive for involuntary urination.  Musculoskeletal:  Positive for joint pain, joint pain, joint swelling, myalgias, morning stiffness and myalgias. Negative for gait problem, muscle weakness and muscle tenderness.  Skin:  Negative for color change, rash, hair loss and sensitivity to sunlight.  Allergic/Immunologic: Negative for susceptible to infections.  Neurological:  Positive for headaches. Negative for dizziness.  Hematological:  Negative for swollen glands.  Psychiatric/Behavioral:  Positive for depressed mood and sleep disturbance. The patient is nervous/anxious.  PMFS History:  Patient Active Problem List   Diagnosis Date Noted   Bilateral hand pain 02/10/2023   Generalized osteoarthritis of multiple sites 02/10/2023   Controlled type 2 diabetes mellitus with complication, without long-term current use of insulin (HCC) 01/05/2023   History of melanoma 09/11/2022    Bipolar 1 disorder, depressed (HCC) 02/25/2022   Generalized anxiety disorder with panic attacks 02/25/2022   Hyperlipidemia 03/28/2020   Morbid obesity (HCC) 03/28/2020   Insomnia 10/13/2019   Major depression, recurrent (HCC) 08/21/2019   Prediabetes 02/28/2019   Attention deficit disorder (ADD) 12/21/2017   CRP elevated 06/16/2016   Chronic arthralgias of knees and hips 05/22/2016   Vitamin D deficiency 02/14/2015   Hypothyroidism 12/26/2014    Past Medical History:  Diagnosis Date   Anxiety    Bipolar disorder (HCC)    Depression    GERD (gastroesophageal reflux disease)    Headache    Heart murmur    History of fainting spells of unknown cause    Hypothyroidism    Kidney stones    Melanoma (HCC)    right arm   Migraine    PONV (postoperative nausea and vomiting)    Thyroid disease     Family History  Problem Relation Age of Onset   Ovarian cancer Mother    Asthma Mother    COPD Mother    Breast cancer Mother 81   Alcohol abuse Father    Kidney disease Father    Diabetes Father    Bipolar disorder Father        suicide attempt   Anxiety disorder Father    Depression Father    Kidney disease Brother    Hepatitis C Brother    Alcohol abuse Brother    Bipolar disorder Brother    Anxiety disorder Brother    Depression Brother    Alcohol abuse Maternal Grandfather    Alcohol abuse Paternal Grandfather    Arthritis Paternal Grandfather    Heart disease Paternal Grandfather    Stroke Paternal Grandfather    Pancreatic cancer Paternal Grandfather    Stroke Paternal Aunt    Breast cancer Paternal Aunt    Past Surgical History:  Procedure Laterality Date   ABDOMINAL HYSTERECTOMY  2000   complete   BREAST BIOPSY Left 1984   CHOLECYSTECTOMY  2000   COLONOSCOPY WITH PROPOFOL N/A 10/04/2021   Procedure: COLONOSCOPY WITH PROPOFOL;  Surgeon: Wyline Mood, MD;  Location: [facility] ENDOSCOPY;  Service: Gastroenterology;  Laterality: N/A;   CYSTOSCOPY N/A 06/26/2016    Procedure: CYSTOSCOPY;  Surgeon: Richardean Chimera, MD;  Location: [facility] ORS;  Service: Gynecology;  Laterality: N/A;   INNER EAR SURGERY     LAPAROSCOPIC SALPINGO OOPHERECTOMY Bilateral 06/26/2016   Procedure: LAPAROSCOPIC SALPINGO OOPHORECTOMY,BILATERAL;  Surgeon: Richardean Chimera, MD;  Location: [facility] ORS;  Service: Gynecology;  Laterality: Bilateral;   TONSILLECTOMY AND ADENOIDECTOMY  1976   WISDOM TOOTH EXTRACTION     Social History   Social History Narrative   10/13/19   From: the area   Living: with Husband Zollie Beckers (1994)   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   Dog: Publishing copy      Family: 2 adult children - Dahlia Client and Colin Mulders       Enjoys: relax, walking, reading      Exercise: walking the dog and with husbant   Diet: 2 green veggies a night, meat, does like snacks      Safety   Seat belts: Yes  Guns: Yes  and secure   Safe in relationships: Yes    Immunization History  Administered Date(s) Administered   Influenza,inj,Quad PF,6+ Mos 04/30/2017, 04/01/2021   Tdap 09/18/2016     Objective: Vital Signs: BP 128/82 (BP Location: Right Arm, Patient Position: Sitting, Cuff Size: Normal)   Pulse 75   Resp 14   Ht 5\' 3"  (1.6 m)   Wt 258 lb (117 kg)   BMI 45.70 kg/m    Physical Exam Constitutional:      Appearance: She is obese.  HENT:     Mouth/Throat:     Mouth: Mucous membranes are moist.     Pharynx: Oropharynx is clear.  Eyes:     Conjunctiva/sclera: Conjunctivae normal.  Cardiovascular:     Rate and Rhythm: Normal rate and regular rhythm.  Pulmonary:     Effort: Pulmonary effort is normal.     Breath sounds: Normal breath sounds.  Musculoskeletal:     Comments: No pitting edema at feet and ankles  Lymphadenopathy:     Cervical: No cervical adenopathy.  Skin:    General: Skin is warm and dry.     Comments: Central facial rosacea  Neurological:     Mental Status: She is alert.  Psychiatric:        Mood and Affect: Mood normal.       Musculoskeletal Exam:  Shoulders full ROM no tenderness or swelling Elbows full ROM no tenderness or swelling Wrists full ROM no swelling, right wrist tenderness to percussion on flexor side with radiation into hand Fingers full ROM no tenderness or swelling Diffuse low back pain midline and bilateral paraspinal muscles lumbar spine Knees full ROM no tenderness or swelling, patellofemoral crepitus worse on right side than left Ankles full ROM no tenderness or swelling   Investigation: No additional findings.  Imaging: No results found.  Recent Labs: Lab Results  Component Value Date   WBC 11.0 (H) 04/10/2022   HGB 12.8 04/10/2022   PLT 333.0 04/10/2022   NA 136 04/10/2022   K 4.5 04/10/2022   CL 101 04/10/2022   CO2 28 04/10/2022   GLUCOSE 80 04/10/2022   BUN 10 04/10/2022   CREATININE 0.82 04/10/2022   BILITOT 0.4 04/10/2022   ALKPHOS 85 04/10/2022   AST 10 04/10/2022   ALT 14 04/10/2022   PROT 6.5 04/10/2022   ALBUMIN 3.9 04/10/2022   CALCIUM 9.4 04/10/2022   GFRAA 71 03/28/2020    Speciality Comments: No specialty comments available.  Procedures:  No procedures performed Allergies: Patient has no known allergies.   Assessment / Plan:     Visit Diagnoses: CRP elevated - Plan: C-reactive protein, Sedimentation rate, C3 and C4, CYCLIC CITRUL PEPTIDE ANTIBODY, IGG/IGA  Mildly elevated CRP there is no peripheral joint synovitis on exam today.  Reported symptoms are more consistent with degenerative arthritis.  Will recheck the CRP also sed rate and serum complements and CCP antibodies.  If same or improved compared to prior level could just follow-up as needed for symptomatic management.  Otherwise may be trial for prescription scheduled NSAIDs as next step.  Bilateral hand pain  Findings appear consistent with mild primary osteoarthritis especially involving second DIP joint of both hands.  Also symptoms suggestive for right-sided carpal tunnel syndrome.  Not  having any decreased range of motion grip weakness or muscle atrophy.  Provided range of motion exercises and recommendation of rigid wrist splint for overnight.  If not seeing any improvement within the next 6 weeks could  follow-up with trial of ultrasound-guided steroid injection.  Generalized osteoarthritis of multiple sites  Agree with continued follow-up in orthopedics office has been getting a good benefit after viscosupplementation shots 2 years ago.  Provided printed information on topical and supplemental therapies for OA symptoms.  Orders: Orders Placed This Encounter  Procedures   C-reactive protein   Sedimentation rate   C3 and C4   CYCLIC CITRUL PEPTIDE ANTIBODY, IGG/IGA   No orders of the defined types were placed in this encounter.    Follow-Up Instructions: Return in about 6 weeks (around 03/24/2023), or if symptoms worsen or fail to improve, for New pt CRP/OA/CTS f/u 6wks PRN.   Fuller Plan, MD  Note - This record has been created using [employer].  Chart creation errors have been sought, but may not always  have been located. Such creation errors do not reflect on  the standard of medical care.

## 2023-02-10 ENCOUNTER — Encounter: Payer: Self-pay | Admitting: Internal Medicine

## 2023-02-10 ENCOUNTER — Ambulatory Visit: Payer: BC Managed Care – PPO | Attending: Internal Medicine | Admitting: Internal Medicine

## 2023-02-10 VITALS — BP 128/82 | HR 75 | Resp 14 | Ht 63.0 in | Wt 258.0 lb

## 2023-02-10 DIAGNOSIS — M159 Polyosteoarthritis, unspecified: Secondary | ICD-10-CM | POA: Insufficient documentation

## 2023-02-10 DIAGNOSIS — M79641 Pain in right hand: Secondary | ICD-10-CM

## 2023-02-10 DIAGNOSIS — M79642 Pain in left hand: Secondary | ICD-10-CM | POA: Insufficient documentation

## 2023-02-10 DIAGNOSIS — R7982 Elevated C-reactive protein (CRP): Secondary | ICD-10-CM | POA: Diagnosis not present

## 2023-02-10 NOTE — Patient Instructions (Signed)

## 2023-03-04 DIAGNOSIS — Z124 Encounter for screening for malignant neoplasm of cervix: Secondary | ICD-10-CM | POA: Diagnosis not present

## 2023-03-04 DIAGNOSIS — Z1151 Encounter for screening for human papillomavirus (HPV): Secondary | ICD-10-CM | POA: Diagnosis not present

## 2023-03-04 DIAGNOSIS — R87610 Atypical squamous cells of undetermined significance on cytologic smear of cervix (ASC-US): Secondary | ICD-10-CM | POA: Diagnosis not present

## 2023-03-05 ENCOUNTER — Other Ambulatory Visit: Payer: Self-pay

## 2023-03-05 DIAGNOSIS — J3089 Other allergic rhinitis: Secondary | ICD-10-CM

## 2023-03-05 NOTE — Telephone Encounter (Signed)
Faxed refill request received

## 2023-03-06 MED ORDER — FLUTICASONE PROPIONATE 50 MCG/ACT NA SUSP
1.0000 | Freq: Two times a day (BID) | NASAL | 5 refills | Status: DC
Start: 2023-03-06 — End: 2023-08-31

## 2023-03-10 DIAGNOSIS — D2262 Melanocytic nevi of left upper limb, including shoulder: Secondary | ICD-10-CM | POA: Diagnosis not present

## 2023-03-10 DIAGNOSIS — D2261 Melanocytic nevi of right upper limb, including shoulder: Secondary | ICD-10-CM | POA: Diagnosis not present

## 2023-03-10 DIAGNOSIS — D225 Melanocytic nevi of trunk: Secondary | ICD-10-CM | POA: Diagnosis not present

## 2023-03-10 DIAGNOSIS — Z1283 Encounter for screening for malignant neoplasm of skin: Secondary | ICD-10-CM | POA: Diagnosis not present

## 2023-03-19 ENCOUNTER — Ambulatory Visit: Payer: BC Managed Care – PPO | Admitting: Family

## 2023-03-25 ENCOUNTER — Ambulatory Visit: Payer: BC Managed Care – PPO | Admitting: Internal Medicine

## 2023-04-02 ENCOUNTER — Telehealth: Payer: Self-pay

## 2023-04-02 NOTE — Telephone Encounter (Signed)
Prior Authorization submitted for Auvelity 45-105 mg #60

## 2023-04-03 NOTE — Telephone Encounter (Signed)
This was previously approved until 01/22/2024, PA# [id number] per [facility]

## 2023-04-09 ENCOUNTER — Other Ambulatory Visit: Payer: BC Managed Care – PPO

## 2023-04-15 ENCOUNTER — Ambulatory Visit: Payer: BC Managed Care – PPO | Admitting: Internal Medicine

## 2023-04-20 ENCOUNTER — Other Ambulatory Visit: Payer: Self-pay

## 2023-04-20 ENCOUNTER — Telehealth: Payer: Self-pay | Admitting: Behavioral Health

## 2023-04-20 DIAGNOSIS — F909 Attention-deficit hyperactivity disorder, unspecified type: Secondary | ICD-10-CM

## 2023-04-20 NOTE — Telephone Encounter (Signed)
Pended.

## 2023-04-20 NOTE — Telephone Encounter (Signed)
Dominique Nunez called in around 1:20. Requests we send her Adderall RX to [facility] on [address] in [place], [place].  Appt 10/22

## 2023-04-21 ENCOUNTER — Telehealth: Payer: Self-pay | Admitting: Behavioral Health

## 2023-04-21 MED ORDER — AMPHETAMINE-DEXTROAMPHET ER 20 MG PO CP24
20.0000 mg | ORAL_CAPSULE | Freq: Every day | ORAL | 0 refills | Status: DC
Start: 2023-04-21 — End: 2023-05-04

## 2023-04-21 NOTE — Telephone Encounter (Signed)
Error

## 2023-04-24 ENCOUNTER — Telehealth: Payer: Self-pay | Admitting: Psychiatry

## 2023-04-24 NOTE — Telephone Encounter (Signed)
Pt cannot find Adderall XR 20mg  in [place], [place], or [place], ( all near her). Can we change the dose to 25mg  and send to a pharmacy that has that? [facility] on [address], [address] does have that. Please let her know.

## 2023-04-27 ENCOUNTER — Other Ambulatory Visit: Payer: Self-pay

## 2023-04-27 MED ORDER — AMPHETAMINE-DEXTROAMPHET ER 25 MG PO CP24: 25.0000 mg | ORAL_CAPSULE | ORAL | 0 refills | Status: DC

## 2023-04-27 NOTE — Telephone Encounter (Signed)
Pended.

## 2023-04-27 NOTE — Telephone Encounter (Signed)
LVM to [facility]

## 2023-04-29 ENCOUNTER — Ambulatory Visit: Payer: BC Managed Care – PPO | Admitting: Family

## 2023-05-03 ENCOUNTER — Telehealth: Payer: Self-pay

## 2023-05-04 ENCOUNTER — Encounter: Payer: Self-pay | Admitting: Family

## 2023-05-04 ENCOUNTER — Ambulatory Visit: Payer: BC Managed Care – PPO | Admitting: Family

## 2023-05-04 VITALS — BP 122/82 | HR 75 | Temp 97.9°F | Ht 62.0 in | Wt 258.0 lb

## 2023-05-04 DIAGNOSIS — E118 Type 2 diabetes mellitus with unspecified complications: Secondary | ICD-10-CM | POA: Diagnosis not present

## 2023-05-04 DIAGNOSIS — Z7984 Long term (current) use of oral hypoglycemic drugs: Secondary | ICD-10-CM | POA: Diagnosis not present

## 2023-05-04 DIAGNOSIS — Z23 Encounter for immunization: Secondary | ICD-10-CM | POA: Diagnosis not present

## 2023-05-04 DIAGNOSIS — F410 Panic disorder [episodic paroxysmal anxiety] without agoraphobia: Secondary | ICD-10-CM

## 2023-05-04 DIAGNOSIS — E039 Hypothyroidism, unspecified: Secondary | ICD-10-CM

## 2023-05-04 DIAGNOSIS — E782 Mixed hyperlipidemia: Secondary | ICD-10-CM

## 2023-05-04 DIAGNOSIS — R7982 Elevated C-reactive protein (CRP): Secondary | ICD-10-CM | POA: Diagnosis not present

## 2023-05-04 DIAGNOSIS — Z85820 Personal history of malignant melanoma of skin: Secondary | ICD-10-CM

## 2023-05-04 DIAGNOSIS — F411 Generalized anxiety disorder: Secondary | ICD-10-CM

## 2023-05-04 NOTE — Assessment & Plan Note (Signed)
Dicsussed diet and exercise Cont metformin 750 mg XL once daily  Work on diabetic diet and exercise as tolerated. Yearly foot exam, and annual eye exam.

## 2023-05-04 NOTE — Assessment & Plan Note (Signed)
Cont levothyroxine 88 mcg once daily  Tsh ordered pending results.

## 2023-05-04 NOTE — Assessment & Plan Note (Signed)
Ordered lipid panel, pending results. Work on low cholesterol diet and exercise as tolerated  

## 2023-05-04 NOTE — Assessment & Plan Note (Signed)
Stable Cont f/u with psychiatry and psychology

## 2023-05-04 NOTE — Progress Notes (Signed)
Established Patient Office Visit  Subjective:      CC:  Chief Complaint  Patient presents with   Medical Management of Chronic Issues    3 month follow up    HPI: Dominique Nunez is a 57 y.o. female presenting on 05/04/2023 for Medical Management of Chronic Issues (3 month follow up) . Psychiatry, Ar[facility] white  Not currently seeing therapist, waiting to reassign her.   Recently saw Dr. Dimple Casey, rheumatology  Workup with elevated CRP Dx with osteoarthritis, not autoimmune in nature. Was advised to have f/u basis pending repeat blood work. Is seeing orthopedist.   DM2: new dx at 6.6 4 months ago. tried ozempic but it made her 'very sick' , increased to metformin 1000 mg twice daily but made her have GI upset so she is now taking 750 mg twice daily. Still with slight nausea. Exercise: walking few times a week, cut down on soft drinks and sweet tea. Last eye exam for diabetic exam neg for retinopathy per her report      Social history:  Relevant past medical, surgical, family and social history reviewed and updated as indicated. Interim medical history since our last visit reviewed.  Allergies and medications reviewed and updated.  DATA REVIEWED: CHART IN EPIC     ROS: Negative unless specifically indicated above in HPI.    Current Outpatient Medications:    amphetamine-dextroamphetamine (ADDERALL XR) 25 MG 24 hr capsule, Take 1 capsule by mouth every morning., Disp: 30 capsule, Rfl: 0   Blood Glucose Monitoring Suppl (ACCU-CHEK GUIDE ME) w/Device KIT, See admin instructions. follow package directions, Disp: , Rfl:    cariprazine (VRAYLAR) 3 MG capsule, Take 1 capsule (3 mg total) by mouth daily., Disp: 90 capsule, Rfl: 1   cetirizine (ZYRTEC) 10 MG tablet, Take 10 mg by mouth daily., Disp: , Rfl:    clonazePAM (KLONOPIN) 0.5 MG disintegrating tablet, Take 1 tablet (0.5 mg total) by mouth daily as needed., Disp: 30 tablet, Rfl: 3   Dextromethorphan-buPROPion ER (AUVELITY)  45-105 MG TBCR, Take one tablet by mouth for 3 days, then take two tablets daily 7-8 hours between doses., Disp: 60 tablet, Rfl: 3   DOTTI 0.1 MG/24HR patch, Place onto the skin 2 (two) times a week., Disp: , Rfl:    fluticasone (FLONASE) 50 MCG/ACT nasal spray, Place 1 spray into both nostrils 2 (two) times daily., Disp: 16 g, Rfl: 5   Glucose Blood (BLOOD GLUCOSE TEST STRIPS) STRP, Check glucose prn symptoms May substitute to any manufacturer covered by patient's insurance., Disp: 100 strip, Rfl: 0   ibuprofen (ADVIL,MOTRIN) 200 MG tablet, Take 800 mg by mouth daily as needed for moderate pain., Disp: , Rfl:    Ivermectin (SOOLANTRA) 1 % CREA, Soolantra 1 % topical cream, Disp: , Rfl:    Lancets MISC, Check glucose po every day prn symptoms Defer to any manufacturer covered with pt insurance, Disp: 100 each, Rfl: 0   levothyroxine (SYNTHROID) 88 MCG tablet, Take 1 tablet (88 mcg total) by mouth daily., Disp: 90 tablet, Rfl: 3   metFORMIN (GLUCOPHAGE-XR) 750 MG 24 hr tablet, Take 1 tablet (750 mg total) by mouth in the morning and at bedtime., Disp: 180 tablet, Rfl: 1   omeprazole (PRILOSEC) 20 MG capsule, Take 1 capsule (20 mg total) by mouth daily., Disp: 90 capsule, Rfl: 3   rizatriptan (MAXALT) 10 MG tablet, TAKE 1 TABLET BY MOUTH AS NEEDED FOR MIGRAINE. MAY REPEAT IN 2  HOURS IF NEEDED, Disp: 10 tablet,  Rfl: 0   Semaglutide,0.25 or 0.5MG /DOS, (OZEMPIC, 0.25 OR 0.5 MG/DOSE,) 2 MG/3ML SOPN, Inject 0.25 mg into the skin once a week., Disp: 3 mL, Rfl: 2   sertraline (ZOLOFT) 100 MG tablet, Take 2 tablets (200 mg total) by mouth daily., Disp: 180 tablet, Rfl: 1   zolpidem (AMBIEN) 10 MG tablet, Take 1 tablet (10 mg total) by mouth at bedtime as needed for sleep., Disp: 30 tablet, Rfl: 0      Objective:    BP 122/82 (BP Location: Left Arm, Patient Position: Sitting, Cuff Size: Large)   Pulse 75   Temp 97.9 F (36.6 C) (Oral)   Ht 5\' 2"  (1.575 m)   Wt 258 lb (117 kg)   SpO2 95%   BMI 47.19  kg/m   Wt Readings from Last 3 Encounters:  05/04/23 258 lb (117 kg)  02/10/23 258 lb (117 kg)  01/27/23 264 lb (119.7 kg)    Physical Exam        Assessment & Plan:  Encounter for immunization -     Flu vaccine trivalent PF, 6mos and older(Flulaval,Afluria,Fluarix,Fluzone)  Mixed hyperlipidemia Assessment & Plan: Ordered lipid panel, pending results. Work on low cholesterol diet and exercise as tolerated   Orders: -     Lipid panel; Future  Controlled type 2 diabetes mellitus with complication, without long-term current use of insulin (HCC) Assessment & Plan: Dicsussed diet and exercise Cont metformin 750 mg XL once daily  Work on diabetic diet and exercise as tolerated. Yearly foot exam, and annual eye exam.    Orders: -     Hemoglobin A1c; Future -     Microalbumin / creatinine urine ratio  Acquired hypothyroidism Assessment & Plan: Cont levothyroxine 88 mcg once daily  Tsh ordered pending results.   Orders: -     TSH  History of melanoma  Generalized anxiety disorder with panic attacks Assessment & Plan: Stable Cont f/u with psychiatry and psychology      Return in about 6 months (around 11/02/2023) for f/u diabetes.  Mort Sawyers, MSN, APRN, FNP-C [facility]

## 2023-05-05 ENCOUNTER — Encounter: Payer: Self-pay | Admitting: Behavioral Health

## 2023-05-05 ENCOUNTER — Ambulatory Visit: Payer: BC Managed Care – PPO | Admitting: Behavioral Health

## 2023-05-05 DIAGNOSIS — F331 Major depressive disorder, recurrent, moderate: Secondary | ICD-10-CM

## 2023-05-05 DIAGNOSIS — F410 Panic disorder [episodic paroxysmal anxiety] without agoraphobia: Secondary | ICD-10-CM

## 2023-05-05 DIAGNOSIS — F411 Generalized anxiety disorder: Secondary | ICD-10-CM

## 2023-05-05 DIAGNOSIS — F99 Mental disorder, not otherwise specified: Secondary | ICD-10-CM

## 2023-05-05 DIAGNOSIS — F5105 Insomnia due to other mental disorder: Secondary | ICD-10-CM | POA: Diagnosis not present

## 2023-05-05 DIAGNOSIS — F319 Bipolar disorder, unspecified: Secondary | ICD-10-CM | POA: Diagnosis not present

## 2023-05-05 MED ORDER — ZOLPIDEM TARTRATE 10 MG PO TABS: 10.0000 mg | ORAL_TABLET | Freq: Every evening | ORAL | 1 refills | Status: DC | PRN

## 2023-05-05 MED ORDER — SERTRALINE HCL 100 MG PO TABS
200.0000 mg | ORAL_TABLET | Freq: Every day | ORAL | 1 refills | Status: DC
Start: 2023-05-05 — End: 2023-09-07

## 2023-05-05 MED ORDER — CARIPRAZINE HCL 3 MG PO CAPS: 3.0000 mg | ORAL_CAPSULE | Freq: Every day | ORAL | 1 refills | Status: DC

## 2023-05-05 MED ORDER — CLONAZEPAM 0.5 MG PO TBDP: 0.5000 mg | ORAL_TABLET | Freq: Every day | ORAL | 3 refills | Status: DC | PRN

## 2023-05-05 MED ORDER — AUVELITY 45-105 MG PO TBCR: EXTENDED_RELEASE_TABLET | ORAL | 3 refills | Status: DC

## 2023-05-05 NOTE — Progress Notes (Signed)
Dominique Nunez [id number] 02-Sep-1965 57 y.o.  Virtual Visit via Telephone Note  I connected with pt on 05/05/23 at  8:00 AM EDT by telephone and verified that I am speaking with the correct person using two identifiers.   I discussed the limitations, risks, security and privacy concerns of performing an evaluation and management service by telephone and the availability of in person appointments. I also discussed with the patient that there may be a patient responsible charge related to this service. The patient expressed understanding and agreed to proceed.   I discussed the assessment and treatment plan with the patient. The patient was provided an opportunity to ask questions and all were answered. The patient agreed with the plan and demonstrated an understanding of the instructions.   The patient was advised to call back or seek an in-person evaluation if the symptoms worsen or if the condition fails to improve as anticipated.  I provided 30 minutes of non-face-to-face time during this encounter.  The patient was located at home.  The provider was located at [facility].   Joan Flores, NP   Subjective:   Patient ID:  Dominique Nunez is a 57 y.o. (DOB 02-29-1966) female.  Chief Complaint:  Chief Complaint  Patient presents with   Anxiety   Depression   Follow-up   Medication Refill   Patient Education    HPI "[name]", 57 year old female presents to this office via telephonic interview for follow up  and medication management. Still liking Auvelity and feels the medication is working well. She would like to stay on course with this regimen for now..  Reports depression  at  3/10 and anxiety is 3/10.  Requesting medication adjustment this visit. Denies side-effects with Vraylar. She denies mania, no psychosis, no auditory or visual hallucinations.  No SI or HI.      Review of Systems:  Review of Systems  Constitutional: Negative.   Allergic/Immunologic: Negative.      Medications: I have reviewed the patient's current medications.  Current Outpatient Medications  Medication Sig Dispense Refill   amphetamine-dextroamphetamine (ADDERALL XR) 25 MG 24 hr capsule Take 1 capsule by mouth every morning. 30 capsule 0   Blood Glucose Monitoring Suppl (ACCU-CHEK GUIDE ME) w/Device KIT See admin instructions. follow package directions     cariprazine (VRAYLAR) 3 MG capsule Take 1 capsule (3 mg total) by mouth daily. 90 capsule 1   cetirizine (ZYRTEC) 10 MG tablet Take 10 mg by mouth daily.     clonazePAM (KLONOPIN) 0.5 MG disintegrating tablet Take 1 tablet (0.5 mg total) by mouth daily as needed. 30 tablet 3   Dextromethorphan-buPROPion ER (AUVELITY) 45-105 MG TBCR Take one tablet by mouth for 3 days, then take two tablets daily 7-8 hours between doses. 60 tablet 3   DOTTI 0.1 MG/24HR patch Place onto the skin 2 (two) times a week.     fluticasone (FLONASE) 50 MCG/ACT nasal spray Place 1 spray into both nostrils 2 (two) times daily. 16 g 5   Glucose Blood (BLOOD GLUCOSE TEST STRIPS) STRP Check glucose prn symptoms May substitute to any manufacturer covered by patient's insurance. 100 strip 0   ibuprofen (ADVIL,MOTRIN) 200 MG tablet Take 800 mg by mouth daily as needed for moderate pain.     Ivermectin (SOOLANTRA) 1 % CREA Soolantra 1 % topical cream     Lancets MISC Check glucose po every day prn symptoms Defer to any manufacturer covered with pt insurance 100 each 0  levothyroxine (SYNTHROID) 88 MCG tablet Take 1 tablet (88 mcg total) by mouth daily. 90 tablet 3   metFORMIN (GLUCOPHAGE-XR) 750 MG 24 hr tablet Take 1 tablet (750 mg total) by mouth in the morning and at bedtime. 180 tablet 1   omeprazole (PRILOSEC) 20 MG capsule Take 1 capsule (20 mg total) by mouth daily. 90 capsule 3   rizatriptan (MAXALT) 10 MG tablet TAKE 1 TABLET BY MOUTH AS NEEDED FOR MIGRAINE. MAY REPEAT IN 2  HOURS IF NEEDED 10 tablet 0   Semaglutide,0.25 or 0.5MG /DOS, (OZEMPIC, 0.25 OR  0.5 MG/DOSE,) 2 MG/3ML SOPN Inject 0.25 mg into the skin once a week. 3 mL 2   sertraline (ZOLOFT) 100 MG tablet Take 2 tablets (200 mg total) by mouth daily. 180 tablet 1   zolpidem (AMBIEN) 10 MG tablet Take 1 tablet (10 mg total) by mouth at bedtime as needed for sleep. 30 tablet 1   No current facility-administered medications for this visit.    Medication Side Effects: None  Allergies: No Known Allergies  Past Medical History:  Diagnosis Date   Anxiety    Bipolar disorder (HCC)    Depression    GERD (gastroesophageal reflux disease)    Headache    Heart murmur    History of fainting spells of unknown cause    Hypothyroidism    Kidney stones    Melanoma (HCC)    right arm   Migraine    PONV (postoperative nausea and vomiting)    Thyroid disease     Family History  Problem Relation Age of Onset   Ovarian cancer Mother    Asthma Mother    COPD Mother    Breast cancer Mother 24   Alcohol abuse Father    Kidney disease Father    Diabetes Father    Bipolar disorder Father        suicide attempt   Anxiety disorder Father    Depression Father    Kidney disease Brother    Hepatitis C Brother    Alcohol abuse Brother    Bipolar disorder Brother    Anxiety disorder Brother    Depression Brother    Alcohol abuse Maternal Grandfather    Alcohol abuse Paternal Grandfather    Arthritis Paternal Grandfather    Heart disease Paternal Grandfather    Stroke Paternal Grandfather    Pancreatic cancer Paternal Grandfather    Stroke Paternal Aunt    Breast cancer Paternal Aunt     Social History   Socioeconomic History   Marital status: Married    Spouse name: Film/video editor   Number of children: 2   Years of education: Not on file   Highest education level: Bachelor's degree (e.g., BA, AB, BS)  Occupational History    Comment: full time    Employer: [employer]  Tobacco Use   Smoking status: Never    Passive exposure: Past   Smokeless tobacco: Never  Vaping Use   Vaping  status: Never Used  Substance and Sexual Activity   Alcohol use: Yes    Comment: rare   Drug use: No   Sexual activity: Yes    Partners: Male    Birth control/protection: Surgical  Other Topics Concern   Not on file  Social History Narrative   10/13/19   From: the area   Living: with Husband Zollie Beckers (1994)   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   Dog: Publishing copy      Family: 2 adult children -  Dahlia Client and Colin Mulders       Enjoys: relax, walking, reading      Exercise: walking the dog and with husbant   Diet: 2 green veggies a night, meat, does like snacks      Safety   Seat belts: Yes    Guns: Yes  and secure   Safe in relationships: Yes    Social Determinants of Health   Financial Resource Strain: Low Risk  (08/11/2017)   Overall Financial Resource Strain (CARDIA)    Difficulty of Paying Living Expenses: Not hard at all  Food Insecurity: No Food Insecurity (08/11/2017)   Hunger Vital Sign    Worried About Running Out of Food in the Last Year: Never true    Ran Out of Food in the Last Year: Never true  Transportation Needs: No Transportation Needs (08/11/2017)   PRAPARE - Administrator, Civil Service (Medical): No    Lack of Transportation (Non-Medical): No  Physical Activity: Inactive (08/11/2017)   Exercise Vital Sign    Days of Exercise per Week: 0 days    Minutes of Exercise per Session: 0 min  Stress: Stress Concern Present (08/11/2017)   [employer] of Occupational Health - Occupational Stress Questionnaire    Feeling of Stress : Rather much  Social Connections: Moderately Isolated (08/11/2017)   Social Connection and Isolation Panel [NHANES]    Frequency of Communication with Friends and Family: Once a week    Frequency of Social Gatherings with Friends and Family: Never    Attends Religious Services: Never    Database administrator or Organizations: No    Attends Banker Meetings: Never    Marital Status: Married   Catering manager Violence: Not At Risk (08/11/2017)   Humiliation, Afraid, Rape, and Kick questionnaire    Fear of Current or Ex-Partner: No    Emotionally Abused: No    Physically Abused: No    Sexually Abused: No    Past Medical History, Surgical history, Social history, and Family history were reviewed and updated as appropriate.   Please see review of systems for further details on the patient's review from today.   Objective:   Physical Exam:  There were no vitals taken for this visit.  Physical Exam Neurological:     Mental Status: She is alert and oriented to person, place, and time.  Psychiatric:        Attention and Perception: Attention and perception normal.        Mood and Affect: Mood normal.        Speech: Speech normal.        Behavior: Behavior normal. Behavior is cooperative.        Cognition and Memory: Cognition and memory normal.        Judgment: Judgment normal.     Comments: Insight intact     Lab Review:     Component Value Date/Time   NA 136 04/10/2022 1302   NA 138 04/01/2021 0955   K 4.5 04/10/2022 1302   CL 101 04/10/2022 1302   CO2 28 04/10/2022 1302   GLUCOSE 80 04/10/2022 1302   BUN 10 04/10/2022 1302   BUN 12 04/01/2021 0955   CREATININE 0.82 04/10/2022 1302   CALCIUM 9.4 04/10/2022 1302   PROT 6.5 04/10/2022 1302   PROT 6.4 04/01/2021 0955   ALBUMIN 3.9 04/10/2022 1302   ALBUMIN 4.2 04/01/2021 0955   AST 10 04/10/2022 1302   ALT 14 04/10/2022 1302  ALKPHOS 85 04/10/2022 1302   BILITOT 0.4 04/10/2022 1302   BILITOT 0.3 04/01/2021 0955   GFRNONAA 62 03/28/2020 0834   GFRAA 71 03/28/2020 0834       Component Value Date/Time   WBC 11.0 (H) 04/10/2022 1302   RBC 4.36 04/10/2022 1302   HGB 12.8 04/10/2022 1302   HGB 13.6 04/01/2021 0955   HCT 38.8 04/10/2022 1302   HCT 41.6 04/01/2021 0955   PLT 333.0 04/10/2022 1302   PLT 373 04/01/2021 0955   MCV 89.1 04/10/2022 1302   MCV 88 04/01/2021 0955   MCH 28.6 04/01/2021 0955    MCH 28.5 06/24/2016 0925   MCHC 32.9 04/10/2022 1302   RDW 13.8 04/10/2022 1302   RDW 13.1 04/01/2021 0955   LYMPHSABS 3.2 (H) 02/21/2019 0815   EOSABS 0.3 02/21/2019 0815   BASOSABS 0.1 02/21/2019 0815    No results found for: "POCLITH", "LITHIUM"   No results found for: "PHENYTOIN", "PHENOBARB", "VALPROATE", "CBMZ"   .res Assessment: Plan:    Greater than 50% of  30 min face to face time with patient was spent on counseling and coordination of care. Discussed her continued  improvement with Auvelity since last visit. Reports 70% improvement since last visit. She is requesting no medication changes.    Continue Vraylar 3 mg daily Continue Sertraline 200 mg daily Continue Auvelity  90-210 mg daily.  Continue Ambien 10 mg at bedtime Continue Adderall 20 mg ER daily To call for worsening symptoms or side effects Provided emergency contact information Reviewed PDMP     Lakeyn was seen today for anxiety, depression, follow-up, medication refill and patient education.  Diagnoses and all orders for this visit:  Bipolar 1 disorder, depressed (HCC) -     cariprazine (VRAYLAR) 3 MG capsule; Take 1 capsule (3 mg total) by mouth daily. -     sertraline (ZOLOFT) 100 MG tablet; Take 2 tablets (200 mg total) by mouth daily. -     Dextromethorphan-buPROPion ER (AUVELITY) 45-105 MG TBCR; Take one tablet by mouth for 3 days, then take two tablets daily 7-8 hours between doses.  Generalized anxiety disorder with panic attacks -     cariprazine (VRAYLAR) 3 MG capsule; Take 1 capsule (3 mg total) by mouth daily. -     sertraline (ZOLOFT) 100 MG tablet; Take 2 tablets (200 mg total) by mouth daily.  Major depressive disorder, recurrent episode, moderate (HCC) -     clonazePAM (KLONOPIN) 0.5 MG disintegrating tablet; Take 1 tablet (0.5 mg total) by mouth daily as needed.  Insomnia due to other mental disorder -     Discontinue: zolpidem (AMBIEN) 10 MG tablet; Take 1 tablet (10 mg total) by  mouth at bedtime as needed for sleep. -     zolpidem (AMBIEN) 10 MG tablet; Take 1 tablet (10 mg total) by mouth at bedtime as needed for sleep.    Please see After Visit Summary for patient specific instructions.  No future appointments.  No orders of the defined types were placed in this encounter.     -------------------------------

## 2023-05-05 NOTE — Telephone Encounter (Signed)
Prior Authorization Auvelity [employer]  Approved Effective: 01/22/23-01/22/24

## 2023-05-06 LAB — HEMOGLOBIN A1C
Est. average glucose Bld gHb Est-mCnc: 128 mg/dL
Hgb A1c MFr Bld: 6.1 % — ABNORMAL HIGH (ref 4.8–5.6)

## 2023-05-06 LAB — LIPID PANEL
Chol/HDL Ratio: 3.9 ratio (ref 0.0–4.4)
Cholesterol, Total: 241 mg/dL — ABNORMAL HIGH (ref 100–199)
HDL: 62 mg/dL (ref >39)
LDL Chol Calc (NIH): 123 mg/dL — ABNORMAL HIGH (ref 0–99)
Triglycerides: 323 mg/dL — ABNORMAL HIGH (ref 0–149)
VLDL Cholesterol Cal: 56 mg/dL — ABNORMAL HIGH (ref 5–40)

## 2023-05-06 LAB — MICROALBUMIN / CREATININE URINE RATIO
Creatinine, Urine: 136.9 mg/dL
Microalb/Creat Ratio: 4 mg/g{creat} (ref 0–29)
Microalbumin, Urine: 5.4 ug/mL

## 2023-05-06 LAB — C-REACTIVE PROTEIN: CRP: 22 mg/L — ABNORMAL HIGH (ref 0–10)

## 2023-05-06 LAB — SEDIMENTATION RATE: Sed Rate: 18 mm/h (ref 0–40)

## 2023-05-06 LAB — C3 AND C4
Complement C3, Serum: 195 mg/dL — ABNORMAL HIGH (ref 82–167)
Complement C4, Serum: 33 mg/dL (ref 12–38)

## 2023-05-06 LAB — SPECIMEN STATUS REPORT

## 2023-05-06 LAB — CYCLIC CITRUL PEPTIDE ANTIBODY, IGG/IGA: Cyclic Citrullin Peptide Ab: 5 U (ref 0–19)

## 2023-05-06 LAB — TSH: TSH: 2.54 u[IU]/mL (ref 0.450–4.500)

## 2023-05-07 ENCOUNTER — Encounter: Payer: Self-pay | Admitting: Internal Medicine

## 2023-06-01 ENCOUNTER — Other Ambulatory Visit: Payer: Self-pay | Admitting: Behavioral Health

## 2023-06-01 DIAGNOSIS — F319 Bipolar disorder, unspecified: Secondary | ICD-10-CM

## 2023-06-01 DIAGNOSIS — F410 Panic disorder [episodic paroxysmal anxiety] without agoraphobia: Secondary | ICD-10-CM

## 2023-06-30 ENCOUNTER — Other Ambulatory Visit: Payer: Self-pay

## 2023-06-30 ENCOUNTER — Telehealth: Payer: Self-pay | Admitting: Behavioral Health

## 2023-06-30 MED ORDER — AMPHETAMINE-DEXTROAMPHET ER 25 MG PO CP24: 25.0000 mg | ORAL_CAPSULE | ORAL | 0 refills | Status: DC

## 2023-06-30 NOTE — Telephone Encounter (Signed)
Pt called and made an appt for 07/17/23. She needs a refill on her adderall xr 25 mg. Pharmacy is [facility] located on [address] in [place]

## 2023-06-30 NOTE — Telephone Encounter (Signed)
Pended Adderall 25 XR to [facility]

## 2023-07-17 ENCOUNTER — Ambulatory Visit: Payer: BC Managed Care – PPO | Admitting: Behavioral Health

## 2023-07-17 ENCOUNTER — Encounter: Payer: Self-pay | Admitting: Behavioral Health

## 2023-07-17 DIAGNOSIS — F411 Generalized anxiety disorder: Secondary | ICD-10-CM

## 2023-07-17 DIAGNOSIS — F410 Panic disorder [episodic paroxysmal anxiety] without agoraphobia: Secondary | ICD-10-CM

## 2023-07-17 DIAGNOSIS — F319 Bipolar disorder, unspecified: Secondary | ICD-10-CM | POA: Diagnosis not present

## 2023-07-17 DIAGNOSIS — F5105 Insomnia due to other mental disorder: Secondary | ICD-10-CM | POA: Diagnosis not present

## 2023-07-17 DIAGNOSIS — F99 Mental disorder, not otherwise specified: Secondary | ICD-10-CM

## 2023-07-17 NOTE — Progress Notes (Signed)
 [facility]  Patient ID: Dominique Nunez,  MRN: [id number]  PCP: Corwin Antu, FNP  Date of Evaluation: 07/17/2023 Time spent:20 minutes  Chief Complaint:  Chief Complaint   Anxiety; Depression; Follow-up; Patient Education; Medication Refill     HISTORY/CURRENT STATUS: HPI Dominique Nunez, 58 year old female presents to this office via telephonic interview for follow up  and medication management. No changes this visit.  Still liking Auvelity  and feels the medication is working well. She would like to stay on course with this regimen for now..  Reports depression  at  3/10 and anxiety is 3/10.  Requesting medication adjustment this visit. Denies side-effects with Vraylar . She denies mania, no psychosis, no auditory or visual hallucinations.  No SI or HI.        Individual Medical History/ Review of Systems: Changes? :No   Allergies: Patient has no known allergies.  Current Medications:  Current Outpatient Medications:    amphetamine -dextroamphetamine (ADDERALL XR) 25 MG 24 hr capsule, Take 1 capsule by mouth every morning., Disp: 30 capsule, Rfl: 0   Blood Glucose Monitoring Suppl (ACCU-CHEK GUIDE ME) w/Device KIT, See admin instructions. follow package directions, Disp: , Rfl:    cariprazine  (VRAYLAR ) 3 MG capsule, Take 1 capsule (3 mg total) by mouth daily., Disp: 90 capsule, Rfl: 1   cetirizine (ZYRTEC) 10 MG tablet, Take 10 mg by mouth daily., Disp: , Rfl:    clonazePAM  (KLONOPIN ) 0.5 MG disintegrating tablet, Take 1 tablet (0.5 mg total) by mouth daily as needed., Disp: 30 tablet, Rfl: 3   Dextromethorphan-buPROPion  ER (AUVELITY ) 45-105 MG TBCR, Take one tablet by mouth for 3 days, then take two tablets daily 7-8 hours between doses., Disp: 60 tablet, Rfl: 3   DOTTI 0.1 MG/24HR patch, Place onto the skin 2 (two) times a week., Disp: , Rfl:    fluticasone  (FLONASE ) 50 MCG/ACT nasal spray, Place 1 spray into both nostrils 2 (two) times daily., Disp: 16 g, Rfl: 5    Glucose Blood (BLOOD GLUCOSE TEST STRIPS) STRP, Check glucose prn symptoms May substitute to any manufacturer covered by patient's insurance., Disp: 100 strip, Rfl: 0   ibuprofen (ADVIL,MOTRIN) 200 MG tablet, Take 800 mg by mouth daily as needed for moderate pain., Disp: , Rfl:    Ivermectin (SOOLANTRA) 1 % CREA, Soolantra 1 % topical cream, Disp: , Rfl:    Lancets MISC, Check glucose po every day prn symptoms Defer to any manufacturer covered with pt insurance, Disp: 100 each, Rfl: 0   levothyroxine  (SYNTHROID ) 88 MCG tablet, Take 1 tablet (88 mcg total) by mouth daily., Disp: 90 tablet, Rfl: 3   metFORMIN  (GLUCOPHAGE -XR) 750 MG 24 hr tablet, Take 1 tablet (750 mg total) by mouth in the morning and at bedtime., Disp: 180 tablet, Rfl: 1   omeprazole  (PRILOSEC) 20 MG capsule, Take 1 capsule (20 mg total) by mouth daily., Disp: 90 capsule, Rfl: 3   rizatriptan  (MAXALT ) 10 MG tablet, TAKE 1 TABLET BY MOUTH AS NEEDED FOR MIGRAINE. MAY REPEAT IN 2  HOURS IF NEEDED, Disp: 10 tablet, Rfl: 0   Semaglutide ,0.25 or 0.5MG /DOS, (OZEMPIC , 0.25 OR 0.5 MG/DOSE,) 2 MG/3ML SOPN, Inject 0.25 mg into the skin once a week., Disp: 3 mL, Rfl: 2   sertraline  (ZOLOFT ) 100 MG tablet, Take 2 tablets (200 mg total) by mouth daily., Disp: 180 tablet, Rfl: 1   zolpidem  (AMBIEN ) 10 MG tablet, Take 1 tablet (10 mg total) by mouth at bedtime as needed for sleep., Disp: 30 tablet, Rfl: 1  Medication Side Effects: none  Family Medical/ Social History: Changes? No  MENTAL HEALTH EXAM:  There were no vitals taken for this visit.There is no height or weight on file to calculate BMI.  General Appearance: Casual, Neat, and Well Groomed  Eye Contact:  Good  Speech:  Clear and Coherent  Volume:  Normal  Mood:  NA  Affect:  Appropriate  Thought Process:  Coherent  Orientation:  Negative  Thought Content: Logical   Suicidal Thoughts:  No  Homicidal Thoughts:  No  Memory:  WNL  Judgement:  Good  Insight:  Good  Psychomotor  Activity:  Normal  Concentration:  Concentration: Good  Recall:  Good  Fund of Knowledge: Good  Language: Good  Assets:  Desire for Improvement  ADL's:  Intact  Cognition: WNL  Prognosis:  Good    DIAGNOSES: No diagnosis found.  Receiving Psychotherapy: No    RECOMMENDATIONS:   Greater than 50% of  30 min face to face time with patient was spent on counseling and coordination of care. No psychosocial  changes this visit. Discussed her continued  improvement with Auvelity  since last visit. Reports 70% improvement since last visit. She is requesting no medication changes.    Continue Vraylar  3 mg daily Continue Sertraline  200 mg daily Continue Auvelity   90-210 mg daily.  Continue Ambien  10 mg at bedtime Continue Adderall 20 mg ER daily To call for worsening symptoms or side effects Provided emergency contact information Reviewed PDMP  Redell DELENA Pizza, NP

## 2023-07-20 ENCOUNTER — Telehealth: Payer: Self-pay | Admitting: Internal Medicine

## 2023-07-20 NOTE — Telephone Encounter (Signed)
 Patient left a voicemail stating she received a bill for $770 from our office.  Patient states she has been fighting with her insurance company [employer] and with [facility] regarding the outstanding balance.  Patient requested a return call to let her know why she wasn't notified that the office was out of network with her insurance.

## 2023-07-29 ENCOUNTER — Telehealth: Payer: Self-pay | Admitting: Internal Medicine

## 2023-07-29 NOTE — Telephone Encounter (Signed)
 Patient left a voicemail requesting a return call regarding the bill she received from our office in the amount of $770.  Patient states she has left multiple messages with the billing department and hasn't received a return call.

## 2023-07-30 ENCOUNTER — Telehealth: Payer: Self-pay | Admitting: Internal Medicine

## 2023-07-30 ENCOUNTER — Telehealth: Payer: Self-pay | Admitting: Rheumatology

## 2023-07-30 NOTE — Telephone Encounter (Signed)
Pt called stating her insurance [employer] did not pay for her visit in July. Pt states billing told her to call us because the provider was not on file with her insurance/ was put in incorrect. Pts appointment was with Dr. Dimple Casey on July  30th as a NPT appt.

## 2023-07-30 NOTE — Telephone Encounter (Signed)
 Error

## 2023-08-24 ENCOUNTER — Ambulatory Visit
Admission: EM | Admit: 2023-08-24 | Discharge: 2023-08-24 | Disposition: A | Discharge status: Home / Self Care | Payer: BC Managed Care – PPO | Attending: Emergency Medicine | Admitting: Emergency Medicine

## 2023-08-24 DIAGNOSIS — J101 Influenza due to other identified influenza virus with other respiratory manifestations: Secondary | ICD-10-CM | POA: Diagnosis not present

## 2023-08-24 LAB — POC COVID19/FLU A&B COMBO
Covid Antigen, POC: NEGATIVE
Influenza A Antigen, POC: POSITIVE — AB
Influenza B Antigen, POC: NEGATIVE

## 2023-08-24 MED ORDER — PREDNISONE 10 MG (21) PO TBPK: ORAL_TABLET | Freq: Every day | ORAL | 0 refills | Status: DC

## 2023-08-24 MED ORDER — GUAIFENESIN-CODEINE 100-10 MG/5ML PO SOLN
5.0000 mL | Freq: Four times a day (QID) | ORAL | 0 refills | Status: DC | PRN
Start: 2023-08-24 — End: 2024-01-13

## 2023-08-24 MED ORDER — OSELTAMIVIR PHOSPHATE 75 MG PO CAPS: 75.0000 mg | ORAL_CAPSULE | Freq: Two times a day (BID) | ORAL | 0 refills | Status: DC

## 2023-08-24 NOTE — ED Provider Notes (Signed)
 [facility]    CSN: [id number] Arrival date & time: 08/24/23  1040      History   Chief Complaint Chief Complaint  Patient presents with   Cough   Fever   Generalized Body Aches   Diarrhea    HPI Dominique Nunez is a 58 y.o. female.   Patient presents for evaluation of fever peaking at 103, nonproductive cough, intermittent headaches and wheezing and shortness of breath when lying present for 3 days.  Known sick contact within a different symptoms.  Tolerating food and liquids.  Has attempted use of Advil.  Past Medical History:  Diagnosis Date   Anxiety    Bipolar disorder (HCC)    Depression    GERD (gastroesophageal reflux disease)    Headache    Heart murmur    History of fainting spells of unknown cause    Hypothyroidism    Kidney stones    Melanoma (HCC)    right arm   Migraine    PONV (postoperative nausea and vomiting)    Thyroid  disease     Patient Active Problem List   Diagnosis Date Noted   Bilateral hand pain 02/10/2023   Generalized osteoarthritis of multiple sites 02/10/2023   Controlled type 2 diabetes mellitus with complication, without long-term current use of insulin (HCC) 01/05/2023   History of melanoma 09/11/2022   Bipolar 1 disorder, depressed (HCC) 02/25/2022   Generalized anxiety disorder with panic attacks 02/25/2022   Hyperlipidemia 03/28/2020   Morbid obesity (HCC) 03/28/2020   Insomnia 10/13/2019   Major depression, recurrent (HCC) 08/21/2019   Attention deficit disorder (ADD) 12/21/2017   CRP elevated 06/16/2016   Chronic arthralgias of knees and hips 05/22/2016   Vitamin D  deficiency 02/14/2015   Hypothyroidism 12/26/2014    Past Surgical History:  Procedure Laterality Date   ABDOMINAL HYSTERECTOMY  2000   complete   BREAST BIOPSY Left 1984   CHOLECYSTECTOMY  2000   COLONOSCOPY WITH PROPOFOL  N/A 10/04/2021   Procedure: COLONOSCOPY WITH PROPOFOL ;  Surgeon: Luke Salaam, MD;  Location: [facility] ENDOSCOPY;  Service:  Gastroenterology;  Laterality: N/A;   CYSTOSCOPY N/A 06/26/2016   Procedure: CYSTOSCOPY;  Surgeon: Merryl Abraham, MD;  Location: [facility];  Service: Gynecology;  Laterality: N/A;   INNER EAR SURGERY     LAPAROSCOPIC SALPINGO OOPHERECTOMY Bilateral 06/26/2016   Procedure: LAPAROSCOPIC SALPINGO OOPHORECTOMY,BILATERAL;  Surgeon: Merryl Abraham, MD;  Location: [facility];  Service: Gynecology;  Laterality: Bilateral;   TONSILLECTOMY AND ADENOIDECTOMY  1976   WISDOM TOOTH EXTRACTION      OB History   No obstetric history on file.      Home Medications    Prior to Admission medications   Medication Sig Start Date End Date Taking? Authorizing Provider  amphetamine -dextroamphetamine (ADDERALL XR) 25 MG 24 hr capsule Take 1 capsule by mouth every morning. 06/30/23  Yes Tonique Mendonca, Polly Brink A, NP  cariprazine  (VRAYLAR ) 3 MG capsule Take 1 capsule (3 mg total) by mouth daily. 05/05/23  Yes Eldana Isip, Mora Apple, NP  Dextromethorphan-buPROPion  ER (AUVELITY ) 45-105 MG TBCR Take one tablet by mouth for 3 days, then take two tablets daily 7-8 hours between doses. 05/05/23  Yes Jassmin Kemmerer, Polly Brink A, NP  DOTTI 0.1 MG/24HR patch Place onto the skin 2 (two) times a week. 05/22/20  Yes [provider]  guaiFENesin -codeine  100-10 MG/5ML syrup Take 5 mLs by mouth every 6 (six) hours as needed for cough. 08/24/23  Yes Norita Meigs, Maybelle Spatz, NP  levothyroxine  (SYNTHROID ) 88 MCG tablet Take  1 tablet (88 mcg total) by mouth daily. 10/23/22  Yes Dugal, Melba Spittle, FNP  omeprazole  (PRILOSEC) 20 MG capsule Take 1 capsule (20 mg total) by mouth daily. 01/05/23  Yes Dugal, Melba Spittle, FNP  oseltamivir  (TAMIFLU ) 75 MG capsule Take 1 capsule (75 mg total) by mouth every 12 (twelve) hours. 08/24/23  Yes Jayla Mackie R, NP  predniSONE  (STERAPRED UNI-PAK 21 TAB) 10 MG (21) TBPK tablet Take by mouth daily. Take 6 tabs by mouth daily  for 1 days, then 5 tabs for 1 days, then 4 tabs for 1 days, then 3 tabs for 1 days, 2 tabs for 1 days, then 1 tab by mouth  daily for 1 days 08/24/23  Yes Samiha Denapoli R, NP  rizatriptan  (MAXALT ) 10 MG tablet TAKE 1 TABLET BY MOUTH AS NEEDED FOR MIGRAINE. MAY REPEAT IN 2  HOURS IF NEEDED 11/07/21  Yes Reford Canterbury, MD  sertraline  (ZOLOFT ) 100 MG tablet Take 2 tablets (200 mg total) by mouth daily. 05/05/23  Yes Damaris Geers, Polly Brink A, NP  zolpidem  (AMBIEN ) 10 MG tablet Take 1 tablet (10 mg total) by mouth at bedtime as needed for sleep. 05/05/23  Yes Marquiz Sotelo, Mora Apple, NP  Blood Glucose Monitoring Suppl (ACCU-CHEK GUIDE ME) w/Device KIT See admin instructions. follow package directions 01/05/23   [provider]  cetirizine (ZYRTEC) 10 MG tablet Take 10 mg by mouth daily.    [provider]  clonazePAM  (KLONOPIN ) 0.5 MG disintegrating tablet Take 1 tablet (0.5 mg total) by mouth daily as needed. 05/05/23   Lincoln Renshaw, NP  fluticasone  (FLONASE ) 50 MCG/ACT nasal spray Place 1 spray into both nostrils 2 (two) times daily. 03/06/23   Felicita Horns, FNP  Glucose Blood (BLOOD GLUCOSE TEST STRIPS) STRP Check glucose prn symptoms May substitute to any manufacturer covered by patient's insurance. 01/05/23   Dugal, Tabitha, FNP  ibuprofen (ADVIL,MOTRIN) 200 MG tablet Take 800 mg by mouth daily as needed for moderate pain.    [provider]  Ivermectin (SOOLANTRA) 1 % CREA Soolantra 1 % topical cream    [provider]  Lancets MISC Check glucose po every day prn symptoms Defer to any manufacturer covered with pt insurance 01/05/23   Felicita Horns, FNP  metFORMIN  (GLUCOPHAGE -XR) 750 MG 24 hr tablet Take 1 tablet (750 mg total) by mouth in the morning and at bedtime. 01/27/23 07/26/23  Felicita Horns, FNP  Semaglutide ,0.25 or 0.5MG /DOS, (OZEMPIC , 0.25 OR 0.5 MG/DOSE,) 2 MG/3ML SOPN Inject 0.25 mg into the skin once a week. 01/27/23   Felicita Horns, FNP    Family History Family History  Problem Relation Age of Onset   Ovarian cancer Mother    Asthma Mother    COPD Mother    Breast cancer Mother 87    Alcohol abuse Father    Kidney disease Father    Diabetes Father    Bipolar disorder Father        suicide attempt   Anxiety disorder Father    Depression Father    Kidney disease Brother    Hepatitis C Brother    Alcohol abuse Brother    Bipolar disorder Brother    Anxiety disorder Brother    Depression Brother    Alcohol abuse Maternal Grandfather    Alcohol abuse Paternal Grandfather    Arthritis Paternal Grandfather    Heart disease Paternal Grandfather    Stroke Paternal Grandfather    Pancreatic cancer Paternal Grandfather    Stroke Paternal Aunt    Breast cancer  Paternal Aunt     Social History Social History   Tobacco Use   Smoking status: Never    Passive exposure: Past   Smokeless tobacco: Never  Vaping Use   Vaping status: Never Used  Substance Use Topics   Alcohol use: Yes    Comment: rare   Drug use: No     Allergies   Patient has no known allergies.   Review of Systems Review of Systems   Physical Exam Triage Vital Signs ED Triage Vitals  Encounter Vitals Group     BP 08/24/23 1105 (!) 142/83     Systolic BP Percentile --      Diastolic BP Percentile --      Pulse Rate 08/24/23 1105 79     Resp 08/24/23 1105 16     Temp 08/24/23 1105 98.6 F (37 C)     Temp Source 08/24/23 1105 Oral     SpO2 08/24/23 1105 96 %     Weight --      Height --      Head Circumference --      Peak Flow --      Pain Score 08/24/23 1104 7     Pain Loc --      Pain Education --      Exclude from Growth Chart --    No data found.  Updated Vital Signs BP (!) 142/83 (BP Location: Right Wrist)   Pulse 79   Temp 98.6 F (37 C) (Oral)   Resp 16   SpO2 96%   Visual Acuity Right Eye Distance:   Left Eye Distance:   Bilateral Distance:    Right Eye Near:   Left Eye Near:    Bilateral Near:     Physical Exam Constitutional:      Appearance: She is ill-appearing.  HENT:     Head: Normocephalic.     Right Ear: Tympanic membrane, ear canal and  external ear normal.     Left Ear: Tympanic membrane, ear canal and external ear normal.     Nose: Congestion present. No rhinorrhea.     Mouth/Throat:     Pharynx: Posterior oropharyngeal erythema present. No oropharyngeal exudate.  Eyes:     Extraocular Movements: Extraocular movements intact.  Cardiovascular:     Rate and Rhythm: Normal rate and regular rhythm.     Pulses: Normal pulses.     Heart sounds: Normal heart sounds.  Pulmonary:     Effort: Pulmonary effort is normal.     Breath sounds: Normal breath sounds.  Musculoskeletal:     Cervical back: Normal range of motion and neck supple.  Neurological:     Mental Status: She is alert and oriented to person, place, and time. Mental status is at baseline.      UC Treatments / Results  Labs (all labs ordered are listed, but only abnormal results are displayed) Labs Reviewed  POC COVID19/FLU A&B COMBO - Abnormal; Notable for the following components:      Result Value   Influenza A Antigen, POC Positive (*)    All other components within normal limits    EKG   Radiology No results found.  Procedures Procedures (including critical care time)  Medications Ordered in UC Medications - No data to display  Initial Impression / Assessment and Plan / UC Course  I have reviewed the triage vital signs and the nursing notes.  Pertinent labs & imaging results that were available during my care of the  patient were reviewed by me and considered in my medical decision making (see chart for details).  Influenza A  Patient is in no signs of distress nor toxic appearing.  Vital signs are stable.  Low suspicion for pneumonia, pneumothorax or bronchitis and therefore will defer imaging.  Prescribed Tamiflu , prednisone , guaifenesin  codeine .  PDMP reviewed, low risk. May use additional over-the-counter medications as needed for supportive care.  May follow-up with urgent care as needed if symptoms persist or worsen.  Note given.    Final Clinical Impressions(s) / UC Diagnoses   Final diagnoses:  Influenza A     Discharge Instructions      Influenza A is a virus and should steadily improve in time it can take up to 7 to 10 days before you truly start to see a turnaround however things will get better  Begin Tamiflu  every morning and every evening for 5 days to reduce the amount of virus in the body which helps to minimize symptoms    You can take Tylenol  and/or Ibuprofen as needed for fever reduction and pain relief.   For cough: honey 1/2 to 1 teaspoon (you can dilute the honey in water or another fluid).  You can also use guaifenesin  and dextromethorphan for cough. You can use a humidifier for chest congestion and cough.  If you don't have a humidifier, you can sit in the bathroom with the hot shower running.      For sore throat: try warm salt water gargles, cepacol lozenges, throat spray, warm tea or water with lemon/honey, popsicles or ice, or OTC cold relief medicine for throat discomfort.   For congestion: take a daily anti-histamine like Zyrtec, Claritin, and a oral decongestant, such as pseudoephedrine.  You can also use Flonase  1-2 sprays in each nostril daily.   It is important to stay hydrated: drink plenty of fluids (water, gatorade/powerade/pedialyte, juices, or teas) to keep your throat moisturized and help further relieve irritation/discomfort.    ED Prescriptions     Medication Sig Dispense Auth. Provider   oseltamivir  (TAMIFLU ) 75 MG capsule Take 1 capsule (75 mg total) by mouth every 12 (twelve) hours. 10 capsule Atia Haupt R, NP   predniSONE  (STERAPRED UNI-PAK 21 TAB) 10 MG (21) TBPK tablet Take by mouth daily. Take 6 tabs by mouth daily  for 1 days, then 5 tabs for 1 days, then 4 tabs for 1 days, then 3 tabs for 1 days, 2 tabs for 1 days, then 1 tab by mouth daily for 1 days 21 tablet Kataryna Mcquilkin R, NP   guaiFENesin -codeine  100-10 MG/5ML syrup Take 5 mLs by mouth every 6 (six) hours  as needed for cough. 120 mL Lanetra Hartley, Maybelle Spatz, NP      I have reviewed the PDMP during this encounter.   Reena Canning, NP 08/24/23 1159

## 2023-08-24 NOTE — Discharge Instructions (Addendum)
 Influenza A is a virus and should steadily improve in time it can take up to 7 to 10 days before you truly start to see a turnaround however things will get better  Begin Tamiflu every morning and every evening for 5 days to reduce the amount of virus in the body which helps to minimize symptoms    You can take Tylenol and/or Ibuprofen as needed for fever reduction and pain relief.   For cough: honey 1/2 to 1 teaspoon (you can dilute the honey in water or another fluid).  You can also use guaifenesin and dextromethorphan for cough. You can use a humidifier for chest congestion and cough.  If you don't have a humidifier, you can sit in the bathroom with the hot shower running.      For sore throat: try warm salt water gargles, cepacol lozenges, throat spray, warm tea or water with lemon/honey, popsicles or ice, or OTC cold relief medicine for throat discomfort.   For congestion: take a daily anti-histamine like Zyrtec, Claritin, and a oral decongestant, such as pseudoephedrine.  You can also use Flonase 1-2 sprays in each nostril daily.   It is important to stay hydrated: drink plenty of fluids (water, gatorade/powerade/pedialyte, juices, or teas) to keep your throat moisturized and help further relieve irritation/discomfort.

## 2023-08-24 NOTE — ED Triage Notes (Signed)
 Fever, Bodyaches, upset stomach, chest congestion, Cough x 3 days

## 2023-08-31 ENCOUNTER — Other Ambulatory Visit: Payer: Self-pay | Admitting: *Deleted

## 2023-08-31 DIAGNOSIS — J3089 Other allergic rhinitis: Secondary | ICD-10-CM

## 2023-08-31 MED ORDER — FLUTICASONE PROPIONATE 50 MCG/ACT NA SUSP
1.0000 | Freq: Two times a day (BID) | NASAL | 5 refills | Status: AC
Start: 2023-08-31 — End: ?

## 2023-09-07 ENCOUNTER — Other Ambulatory Visit: Payer: Self-pay | Admitting: Behavioral Health

## 2023-09-07 DIAGNOSIS — F319 Bipolar disorder, unspecified: Secondary | ICD-10-CM

## 2023-09-07 DIAGNOSIS — F410 Panic disorder [episodic paroxysmal anxiety] without agoraphobia: Secondary | ICD-10-CM

## 2023-09-09 DIAGNOSIS — Z01419 Encounter for gynecological examination (general) (routine) without abnormal findings: Secondary | ICD-10-CM | POA: Diagnosis not present

## 2023-09-09 DIAGNOSIS — Z6841 Body Mass Index (BMI) 40.0 and over, adult: Secondary | ICD-10-CM | POA: Diagnosis not present

## 2023-09-09 DIAGNOSIS — Z1151 Encounter for screening for human papillomavirus (HPV): Secondary | ICD-10-CM | POA: Diagnosis not present

## 2023-09-09 DIAGNOSIS — Z1231 Encounter for screening mammogram for malignant neoplasm of breast: Secondary | ICD-10-CM | POA: Diagnosis not present

## 2023-09-09 LAB — HM MAMMOGRAPHY

## 2023-09-14 ENCOUNTER — Other Ambulatory Visit: Payer: Self-pay | Admitting: Family

## 2023-09-14 DIAGNOSIS — E039 Hypothyroidism, unspecified: Secondary | ICD-10-CM

## 2023-09-14 MED ORDER — LEVOTHYROXINE SODIUM 88 MCG PO TABS: 88.0000 ug | ORAL_TABLET | Freq: Every day | ORAL | 0 refills | Status: DC

## 2023-09-14 NOTE — Telephone Encounter (Signed)
 Copied from CRM [id number]. Topic: Clinical - Medication Refill >> Sep 14, 2023 10:51 AM Drema Balzarine wrote: Most Recent Primary Care Visit:  Provider: Mort Sawyers  Department: [facility]  Visit Type: OFFICE VISIT  Date: 05/04/2023  Medication: levothyroxine (SYNTHROID) 88 MCG tablet [[id number]]  Has the patient contacted their pharmacy? Yes, waiting for providers approval. Patient dropped bottle on the ground and is missing a few pills.   (Agent: If no, request that the patient contact the pharmacy for the refill. If patient does not wish to contact the pharmacy document the reason why and proceed with request.) (Agent: If yes, when and what did the pharmacy advise?)  Is this the correct pharmacy for this prescription? Yes If no, delete pharmacy and type the correct one.  This is the patient's preferred pharmacy:   [id number] [place], [place] - [address] AT [facility] [address] [place] [address] Phone: [phone] Fax: [phone]   Has the prescription been filled recently? Yes  Is the patient out of the medication? Yes, took last pill last week   Has the patient been seen for an appointment in the last year OR does the patient have an upcoming appointment? Yes  Can we respond through MyChart? Yes  Agent: Please be advised that Rx refills may take up to 3 business days. We ask that you follow-up with your pharmacy.

## 2023-09-16 ENCOUNTER — Telehealth: Payer: Self-pay | Admitting: Family

## 2023-09-16 ENCOUNTER — Other Ambulatory Visit: Payer: Self-pay | Admitting: Behavioral Health

## 2023-09-16 DIAGNOSIS — F5105 Insomnia due to other mental disorder: Secondary | ICD-10-CM

## 2023-09-16 NOTE — Telephone Encounter (Signed)
 Per our records, pt is due for her mammogram. LM for pt to return call.

## 2023-09-17 ENCOUNTER — Telehealth: Payer: Self-pay

## 2023-09-17 ENCOUNTER — Encounter: Payer: Self-pay | Admitting: Family

## 2023-09-17 ENCOUNTER — Other Ambulatory Visit: Payer: Self-pay

## 2023-09-17 ENCOUNTER — Telehealth: Payer: Self-pay | Admitting: Behavioral Health

## 2023-09-17 NOTE — Telephone Encounter (Signed)
 Pt called asking for refills on her adderall xr 25 mg and her zolpidem 10 mg. Pharmacy is the [facility] on [address]

## 2023-09-17 NOTE — Telephone Encounter (Signed)
 Letter has been sent to [facility] to get a copy of pt's mammogram.

## 2023-09-17 NOTE — Telephone Encounter (Signed)
 Received RF for Ambien thru pharmacy and it was pended. Pended Adderall XR 25 mg.

## 2023-09-17 NOTE — Telephone Encounter (Signed)
 Copied from CRM [id number]. Topic: General - Other >> Sep 16, 2023  4:00 PM [place] J wrote: Reason for CRM: Patient returning call from [place] [employer]). Patient stated that she did have her mammogram last week, which was done at [facility]. She also stated that it was analyzed at the breast cancer center.

## 2023-09-18 MED ORDER — AMPHETAMINE-DEXTROAMPHET ER 25 MG PO CP24: 25.0000 mg | ORAL_CAPSULE | ORAL | 0 refills | Status: DC

## 2023-09-28 ENCOUNTER — Ambulatory Visit: Payer: BC Managed Care – PPO | Admitting: Internal Medicine

## 2023-09-29 ENCOUNTER — Ambulatory Visit
Admission: EM | Admit: 2023-09-29 | Discharge: 2023-09-29 | Disposition: A | Discharge status: Home / Self Care | Attending: Emergency Medicine | Admitting: Emergency Medicine

## 2023-09-29 DIAGNOSIS — J069 Acute upper respiratory infection, unspecified: Secondary | ICD-10-CM | POA: Diagnosis not present

## 2023-09-29 MED ORDER — AZITHROMYCIN 250 MG PO TABS: 250.0000 mg | ORAL_TABLET | Freq: Every day | ORAL | 0 refills | Status: DC

## 2023-09-29 MED ORDER — ALBUTEROL SULFATE HFA 108 (90 BASE) MCG/ACT IN AERS: 1.0000 | INHALATION_SPRAY | Freq: Four times a day (QID) | RESPIRATORY_TRACT | 0 refills | Status: DC | PRN

## 2023-09-29 NOTE — ED Provider Notes (Signed)
 Dominique Nunez    CSN: [id number] Arrival date & time: 09/29/23  1332      History   Chief Complaint Chief Complaint  Patient presents with   Cough    Cough, chest tightness, chills, fatigue and chest congestion x1 week    HPI Dominique Nunez is a 58 y.o. female.  Patient presents with 1 week history of chills, fatigue, congestion, cough, diarrhea.  She states her chest feels tight when she coughs like she is unable to get a deep breath.  2 episodes of diarrhea today.  She has taken ibuprofen for her symptoms.  Unknown if any fever at home.  No chest pain, shortness of breath, or vomiting.  No recent travel out of the country or antibiotic use.  Patient was seen at this urgent care on 08/24/2023; diagnosed with influenza A; treated with Tamiflu, prednisone, codeine cough syrup.  The history is provided by the patient and medical records.    Past Medical History:  Diagnosis Date   Anxiety    Bipolar disorder (HCC)    Depression    GERD (gastroesophageal reflux disease)    Headache    Heart murmur    History of fainting spells of unknown cause    Hypothyroidism    Kidney stones    Melanoma (HCC)    right arm   Migraine    PONV (postoperative nausea and vomiting)    Thyroid disease     Patient Active Problem List   Diagnosis Date Noted   Bilateral hand pain 02/10/2023   Generalized osteoarthritis of multiple sites 02/10/2023   Controlled type 2 diabetes mellitus with complication, without long-term current use of insulin (HCC) 01/05/2023   History of melanoma 09/11/2022   Bipolar 1 disorder, depressed (HCC) 02/25/2022   Generalized anxiety disorder with panic attacks 02/25/2022   Hyperlipidemia 03/28/2020   Morbid obesity (HCC) 03/28/2020   Insomnia 10/13/2019   Major depression, recurrent (HCC) 08/21/2019   Attention deficit disorder (ADD) 12/21/2017   CRP elevated 06/16/2016   Chronic arthralgias of knees and hips 05/22/2016   Vitamin D deficiency 02/14/2015    Hypothyroidism 12/26/2014    Past Surgical History:  Procedure Laterality Date   ABDOMINAL HYSTERECTOMY  2000   complete   BREAST BIOPSY Left 1984   CHOLECYSTECTOMY  2000   COLONOSCOPY WITH PROPOFOL N/A 10/04/2021   Procedure: COLONOSCOPY WITH PROPOFOL;  Surgeon: Wyline Mood, MD;  Location: [facility] ENDOSCOPY;  Service: Gastroenterology;  Laterality: N/A;   CYSTOSCOPY N/A 06/26/2016   Procedure: CYSTOSCOPY;  Surgeon: Richardean Chimera, MD;  Location: [facility] ORS;  Service: Gynecology;  Laterality: N/A;   INNER EAR SURGERY     LAPAROSCOPIC SALPINGO OOPHERECTOMY Bilateral 06/26/2016   Procedure: LAPAROSCOPIC SALPINGO OOPHORECTOMY,BILATERAL;  Surgeon: Richardean Chimera, MD;  Location: [facility] ORS;  Service: Gynecology;  Laterality: Bilateral;   TONSILLECTOMY AND ADENOIDECTOMY  1976   WISDOM TOOTH EXTRACTION      OB History   No obstetric history on file.      Home Medications    Prior to Admission medications   Medication Sig Start Date End Date Taking? Authorizing Provider  amphetamine-dextroamphetamine (ADDERALL XR) 25 MG 24 hr capsule Take 1 capsule by mouth every morning. 09/18/23  Yes Joan Flores, NP  Blood Glucose Monitoring Suppl (ACCU-CHEK GUIDE ME) w/Device KIT See admin instructions. follow package directions 01/05/23  Yes [provider]  cariprazine (VRAYLAR) 3 MG capsule Take 1 capsule (3 mg total) by mouth daily. 05/05/23  Yes Avelina Laine  A, NP  cetirizine (ZYRTEC) 10 MG tablet Take 10 mg by mouth daily.   Yes [provider]  clonazePAM (KLONOPIN) 0.5 MG disintegrating tablet Take 1 tablet (0.5 mg total) by mouth daily as needed. 05/05/23  Yes White, Watt Climes, NP  Dextromethorphan-buPROPion ER (AUVELITY) 45-105 MG TBCR Take one tablet by mouth for 3 days, then take two tablets daily 7-8 hours between doses. 05/05/23  Yes White, Arlys John A, NP  DOTTI 0.1 MG/24HR patch Place onto the skin 2 (two) times a week. 05/22/20  Yes [provider]  fluticasone (FLONASE) 50  MCG/ACT nasal spray Place 1 spray into both nostrils 2 (two) times daily. 08/31/23  Yes Mort Sawyers, FNP  Glucose Blood (BLOOD GLUCOSE TEST STRIPS) STRP Check glucose prn symptoms May substitute to any manufacturer covered by patient's insurance. 01/05/23  Yes Dugal, Wyatt Mage, FNP  guaiFENesin-codeine 100-10 MG/5ML syrup Take 5 mLs by mouth every 6 (six) hours as needed for cough. 08/24/23  Yes White, Adrienne R, NP  ibuprofen (ADVIL,MOTRIN) 200 MG tablet Take 800 mg by mouth daily as needed for moderate pain.   Yes [provider]  Ivermectin (SOOLANTRA) 1 % CREA Soolantra 1 % topical cream   Yes [provider]  Lancets MISC Check glucose po every day prn symptoms Defer to any manufacturer covered with pt insurance 01/05/23  Yes Mort Sawyers, FNP  levothyroxine (SYNTHROID) 88 MCG tablet Take 1 tablet (88 mcg total) by mouth daily. 09/14/23  Yes Dugal, Wyatt Mage, FNP  omeprazole (PRILOSEC) 20 MG capsule Take 1 capsule (20 mg total) by mouth daily. 01/05/23  Yes Dugal, Wyatt Mage, FNP  rizatriptan (MAXALT) 10 MG tablet TAKE 1 TABLET BY MOUTH AS NEEDED FOR MIGRAINE. MAY REPEAT IN 2  HOURS IF NEEDED 11/07/21  Yes Gweneth Dimitri, MD  Semaglutide,0.25 or 0.5MG /DOS, (OZEMPIC, 0.25 OR 0.5 MG/DOSE,) 2 MG/3ML SOPN Inject 0.25 mg into the skin once a week. 01/27/23  Yes Dugal, Wyatt Mage, FNP  sertraline (ZOLOFT) 100 MG tablet TAKE 2 TABLETS(200 MG) BY MOUTH DAILY 09/07/23  Yes White, Arlys John A, NP  zolpidem (AMBIEN) 10 MG tablet TAKE 1 TABLET(10 MG) BY MOUTH AT BEDTIME AS NEEDED FOR SLEEP 09/18/23  Yes White, Brian A, NP  albuterol (VENTOLIN HFA) 108 (90 Base) MCG/ACT inhaler Inhale 1-2 puffs into the lungs every 6 (six) hours as needed. 09/29/23  Yes Mickie Bail, NP  azithromycin (ZITHROMAX) 250 MG tablet Take 1 tablet (250 mg total) by mouth daily. Take first 2 tablets together, then 1 every day until finished. 09/29/23  Yes Mickie Bail, NP  metFORMIN (GLUCOPHAGE-XR) 750 MG 24 hr tablet Take 1 tablet (750  mg total) by mouth in the morning and at bedtime. 01/27/23 07/26/23  Mort Sawyers, FNP    Family History Family History  Problem Relation Age of Onset   Ovarian cancer Mother    Asthma Mother    COPD Mother    Breast cancer Mother 35   Alcohol abuse Father    Kidney disease Father    Diabetes Father    Bipolar disorder Father        suicide attempt   Anxiety disorder Father    Depression Father    Kidney disease Brother    Hepatitis C Brother    Alcohol abuse Brother    Bipolar disorder Brother    Anxiety disorder Brother    Depression Brother    Alcohol abuse Maternal Grandfather    Alcohol abuse Paternal Grandfather    Arthritis Paternal Actor  Heart disease Paternal Grandfather    Stroke Paternal Grandfather    Pancreatic cancer Paternal Grandfather    Stroke Paternal Aunt    Breast cancer Paternal Aunt     Social History Social History   Tobacco Use   Smoking status: Never    Passive exposure: Past   Smokeless tobacco: Never  Vaping Use   Vaping status: Never Used  Substance Use Topics   Alcohol use: Not Currently    Comment: rare   Drug use: No     Allergies   Patient has no known allergies.   Review of Systems Review of Systems  Constitutional:  Positive for chills and fatigue. Negative for fever.  HENT:  Positive for congestion. Negative for ear pain and sore throat.   Respiratory:  Positive for cough. Negative for shortness of breath.   Cardiovascular:  Negative for chest pain and palpitations.  Gastrointestinal:  Positive for diarrhea. Negative for abdominal pain and vomiting.     Physical Exam Triage Vital Signs ED Triage Vitals  Encounter Vitals Group     BP 09/29/23 1506 (!) 148/82     Systolic BP Percentile --      Diastolic BP Percentile --      Pulse Rate 09/29/23 1506 60     Resp 09/29/23 1506 18     Temp 09/29/23 1506 97.9 F (36.6 C)     Temp Source 09/29/23 1506 Oral     SpO2 09/29/23 1506 96 %     Weight 09/29/23  1504 264 lb (119.7 kg)     Height 09/29/23 1504 5\' 2"  (1.575 m)     Head Circumference --      Peak Flow --      Pain Score 09/29/23 1504 0     Pain Loc --      Pain Education --      Exclude from Growth Chart --    No data found.  Updated Vital Signs BP (!) 148/82 (BP Location: Left Arm)   Pulse 60   Temp 97.9 F (36.6 C) (Oral)   Resp 18   Ht 5\' 2"  (1.575 m)   Wt 264 lb (119.7 kg)   SpO2 96%   BMI 48.29 kg/m   Visual Acuity Right Eye Distance:   Left Eye Distance:   Bilateral Distance:    Right Eye Near:   Left Eye Near:    Bilateral Near:     Physical Exam Constitutional:      General: She is not in acute distress.    Appearance: She is obese.  HENT:     Right Ear: Tympanic membrane normal.     Left Ear: Tympanic membrane normal.     Nose: Nose normal.     Mouth/Throat:     Mouth: Mucous membranes are moist.     Pharynx: Oropharynx is clear.  Cardiovascular:     Rate and Rhythm: Normal rate and regular rhythm.     Heart sounds: Normal heart sounds.  Pulmonary:     Effort: Pulmonary effort is normal. No respiratory distress.     Breath sounds: Normal breath sounds.  Abdominal:     General: Bowel sounds are normal.     Palpations: Abdomen is soft.     Tenderness: There is no abdominal tenderness. There is no guarding or rebound.  Neurological:     Mental Status: She is alert.      UC Treatments / Results  Labs (all labs ordered are listed, but only  abnormal results are displayed) Labs Reviewed - No data to display  EKG   Radiology No results found.  Procedures Procedures (including critical care time)  Medications Ordered in UC Medications - No data to display  Initial Impression / Assessment and Plan / UC Course  I have reviewed the triage vital signs and the nursing notes.  Pertinent labs & imaging results that were available during my care of the patient were reviewed by me and considered in my medical decision making (see chart for  details).    Acute upper respiratory infection.  Afebrile and vital signs are stable.  Lungs are clear and O2 sat is 96% on room air.  Treating with Zithromax and albuterol inhaler.  Instructed patient to follow-up with her PCP tomorrow.  ED precautions given.  Education provided on upper respiratory infection.  Patient agrees to plan of care.  Final Clinical Impressions(s) / UC Diagnoses   Final diagnoses:  Acute upper respiratory infection     Discharge Instructions      Take the Zithromax and use the albuterol inhaler.  Follow up with your primary care provider tomorrow.  Go to the emergency department if you have worsening symptoms.        ED Prescriptions     Medication Sig Dispense Auth. Provider   albuterol (VENTOLIN HFA) 108 (90 Base) MCG/ACT inhaler Inhale 1-2 puffs into the lungs every 6 (six) hours as needed. 18 g Mickie Bail, NP   azithromycin (ZITHROMAX) 250 MG tablet Take 1 tablet (250 mg total) by mouth daily. Take first 2 tablets together, then 1 every day until finished. 6 tablet Mickie Bail, NP      I have reviewed the PDMP during this encounter.   Mickie Bail, NP 09/29/23 1540

## 2023-09-29 NOTE — Discharge Instructions (Signed)
 Take the Zithromax and use the albuterol inhaler.  Follow up with your primary care provider tomorrow.  Go to the emergency department if you have worsening symptoms.

## 2023-09-29 NOTE — ED Triage Notes (Signed)
 Pt states that she has a cough, chest tightness, chills, fatigue and chest congestion x1 week

## 2023-10-02 ENCOUNTER — Ambulatory Visit: Payer: BC Managed Care – PPO | Admitting: Internal Medicine

## 2023-10-15 ENCOUNTER — Ambulatory Visit (INDEPENDENT_AMBULATORY_CARE_PROVIDER_SITE_OTHER): Payer: BC Managed Care – PPO | Admitting: Behavioral Health

## 2023-11-25 ENCOUNTER — Ambulatory Visit: Admitting: Internal Medicine

## 2023-12-02 ENCOUNTER — Ambulatory Visit: Admitting: Behavioral Health

## 2023-12-02 ENCOUNTER — Encounter: Payer: Self-pay | Admitting: Behavioral Health

## 2023-12-02 DIAGNOSIS — F319 Bipolar disorder, unspecified: Secondary | ICD-10-CM

## 2023-12-02 DIAGNOSIS — F99 Mental disorder, not otherwise specified: Secondary | ICD-10-CM

## 2023-12-02 DIAGNOSIS — F411 Generalized anxiety disorder: Secondary | ICD-10-CM | POA: Diagnosis not present

## 2023-12-02 DIAGNOSIS — F331 Major depressive disorder, recurrent, moderate: Secondary | ICD-10-CM

## 2023-12-02 DIAGNOSIS — F909 Attention-deficit hyperactivity disorder, unspecified type: Secondary | ICD-10-CM

## 2023-12-02 DIAGNOSIS — F410 Panic disorder [episodic paroxysmal anxiety] without agoraphobia: Secondary | ICD-10-CM

## 2023-12-02 DIAGNOSIS — F5105 Insomnia due to other mental disorder: Secondary | ICD-10-CM

## 2023-12-02 MED ORDER — CLONAZEPAM 0.5 MG PO TBDP: 0.5000 mg | ORAL_TABLET | Freq: Every day | ORAL | 3 refills | Status: DC | PRN

## 2023-12-02 MED ORDER — CARIPRAZINE HCL 3 MG PO CAPS: 3.0000 mg | ORAL_CAPSULE | Freq: Every day | ORAL | 1 refills | Status: DC

## 2023-12-02 MED ORDER — AMPHETAMINE-DEXTROAMPHET ER 25 MG PO CP24: 25.0000 mg | ORAL_CAPSULE | ORAL | 0 refills | Status: DC

## 2023-12-02 MED ORDER — SERTRALINE HCL 100 MG PO TABS: 100.0000 mg | ORAL_TABLET | Freq: Every day | ORAL | 1 refills | Status: DC

## 2023-12-02 MED ORDER — AUVELITY 45-105 MG PO TBCR: EXTENDED_RELEASE_TABLET | ORAL | 3 refills | Status: DC

## 2023-12-02 MED ORDER — ZOLPIDEM TARTRATE 10 MG PO TABS: 10.0000 mg | ORAL_TABLET | Freq: Every evening | ORAL | 2 refills | Status: DC | PRN

## 2023-12-02 NOTE — Progress Notes (Signed)
 [facility]  Patient ID: Dominique Nunez,  MRN: [id number]  PCP: Dominique Horns, FNP  Date of Evaluation: 12/02/2023 Time spent:30 minutes  Chief Complaint:  Chief Complaint   Depression; Anxiety; Follow-up; Medication Refill; Medication Problem; Patient Education; Stress; ADHD     HISTORY/CURRENT STATUS: HPI "Dominique Nunez", 58  year old female presents to this for follow up  and medication management. No changes this visit.  Still liking Auvelity  and feels the medication is working well. Says,  "I think I am on a really good course right now". Still having a little marital conflict but trying to work through it.  Requesting no medication changes.  Reports depression  at  2/10 and anxiety is 2/10.  Requesting medication adjustment this visit. Denies side-effects with Vraylar . She denies mania, no psychosis, no auditory or visual hallucinations.  No SI or HI.     Individual Medical History/ Review of Systems: Changes? :No   Allergies: Patient has no known allergies.  Current Medications:  Current Outpatient Medications:    albuterol  (VENTOLIN  HFA) 108 (90 Base) MCG/ACT inhaler, Inhale 1-2 puffs into the lungs every 6 (six) hours as needed., Disp: 18 g, Rfl: 0   amphetamine -dextroamphetamine (ADDERALL XR) 25 MG 24 hr capsule, Take 1 capsule by mouth every morning., Disp: 30 capsule, Rfl: 0   azithromycin  (ZITHROMAX ) 250 MG tablet, Take 1 tablet (250 mg total) by mouth daily. Take first 2 tablets together, then 1 every day until finished., Disp: 6 tablet, Rfl: 0   Blood Glucose Monitoring Suppl (ACCU-CHEK GUIDE ME) w/Device KIT, See admin instructions. follow package directions, Disp: , Rfl:    cariprazine  (VRAYLAR ) 3 MG capsule, Take 1 capsule (3 mg total) by mouth daily., Disp: 90 capsule, Rfl: 1   cetirizine (ZYRTEC) 10 MG tablet, Take 10 mg by mouth daily., Disp: , Rfl:    clonazePAM  (KLONOPIN ) 0.5 MG disintegrating tablet, Take 1 tablet (0.5 mg total) by mouth daily as  needed., Disp: 30 tablet, Rfl: 3   Dextromethorphan-buPROPion  ER (AUVELITY ) 45-105 MG TBCR, Take one tablet by mouth for 3 days, then take two tablets daily 7-8 hours between doses., Disp: 60 tablet, Rfl: 3   DOTTI 0.1 MG/24HR patch, Place onto the skin 2 (two) times a week., Disp: , Rfl:    fluticasone  (FLONASE ) 50 MCG/ACT nasal spray, Place 1 spray into both nostrils 2 (two) times daily., Disp: 16 g, Rfl: 5   Glucose Blood (BLOOD GLUCOSE TEST STRIPS) STRP, Check glucose prn symptoms May substitute to any manufacturer covered by patient's insurance., Disp: 100 strip, Rfl: 0   guaiFENesin -codeine  100-10 MG/5ML syrup, Take 5 mLs by mouth every 6 (six) hours as needed for cough., Disp: 120 mL, Rfl: 0   ibuprofen (ADVIL,MOTRIN) 200 MG tablet, Take 800 mg by mouth daily as needed for moderate pain., Disp: , Rfl:    Ivermectin (SOOLANTRA) 1 % CREA, Soolantra 1 % topical cream, Disp: , Rfl:    Lancets MISC, Check glucose po every day prn symptoms Defer to any manufacturer covered with pt insurance, Disp: 100 each, Rfl: 0   levothyroxine  (SYNTHROID ) 88 MCG tablet, Take 1 tablet (88 mcg total) by mouth daily., Disp: 90 tablet, Rfl: 0   metFORMIN  (GLUCOPHAGE -XR) 750 MG 24 hr tablet, Take 1 tablet (750 mg total) by mouth in the morning and at bedtime., Disp: 180 tablet, Rfl: 1   omeprazole  (PRILOSEC) 20 MG capsule, Take 1 capsule (20 mg total) by mouth daily., Disp: 90 capsule, Rfl: 3   rizatriptan  (MAXALT )  10 MG tablet, TAKE 1 TABLET BY MOUTH AS NEEDED FOR MIGRAINE. MAY REPEAT IN 2  HOURS IF NEEDED, Disp: 10 tablet, Rfl: 0   Semaglutide ,0.25 or 0.5MG /DOS, (OZEMPIC , 0.25 OR 0.5 MG/DOSE,) 2 MG/3ML SOPN, Inject 0.25 mg into the skin once a week., Disp: 3 mL, Rfl: 2   sertraline  (ZOLOFT ) 100 MG tablet, Take 1 tablet (100 mg total) by mouth daily., Disp: 180 tablet, Rfl: 1   zolpidem  (AMBIEN ) 10 MG tablet, Take 1 tablet (10 mg total) by mouth at bedtime as needed for sleep., Disp: 30 tablet, Rfl: 2 Medication Side  Effects: none  Family Medical/ Social History: Changes? No  MENTAL HEALTH EXAM:  There were no vitals taken for this visit.There is no height or weight on file to calculate BMI.  General Appearance: Casual, Neat, and Well Groomed  Eye Contact:  Good  Speech:  Clear and Coherent  Volume:  Normal  Mood:  NA  Affect:  Appropriate  Thought Process:  Coherent  Orientation:  Full (Time, Place, and Person)  Thought Content: Logical   Suicidal Thoughts:  No  Homicidal Thoughts:  No  Memory:  WNL  Judgement:  Good  Insight:  Good  Psychomotor Activity:  Normal  Concentration:  Concentration: Good  Recall:  Good  Fund of Knowledge: Good  Language: Good  Assets:  Desire for Improvement  ADL's:  Intact  Cognition: WNL  Prognosis:  Good    DIAGNOSES:    ICD-10-CM   1. Bipolar 1 disorder, depressed (HCC)  F31.9 Dextromethorphan-buPROPion  ER (AUVELITY ) 45-105 MG TBCR    sertraline  (ZOLOFT ) 100 MG tablet    cariprazine  (VRAYLAR ) 3 MG capsule    2. Generalized anxiety disorder with panic attacks  F41.1 sertraline  (ZOLOFT ) 100 MG tablet   F41.0 cariprazine  (VRAYLAR ) 3 MG capsule    3. Insomnia due to other mental disorder  F51.05 zolpidem  (AMBIEN ) 10 MG tablet   F99     4. Attention deficit hyperactivity disorder (ADHD), unspecified ADHD type  F90.9     5. Major depressive disorder, recurrent episode, moderate (HCC)  F33.1 clonazePAM  (KLONOPIN ) 0.5 MG disintegrating tablet      Receiving Psychotherapy: No    RECOMMENDATIONS:   Greater than 50% of  30 min face to face time with patient was spent on counseling and coordination of care.Smiling and happy today.  No psychosocial  changes this visit. Discussed her continued  improvement with Auvelity  since last visit.  She is requesting no medication changes.    Continue Vraylar  3 mg daily Continue  Klonopin  0.5 mg daily as needed Continue Sertraline  200 mg daily Continue Auvelity   90-210 mg daily.  Continue Ambien  10 mg at  bedtime Continue Adderall 20 mg ER daily  To call for worsening symptoms or side effects Provided emergency contact information Reviewed PDMP   Lincoln Renshaw, NP

## 2023-12-08 ENCOUNTER — Other Ambulatory Visit: Payer: Self-pay | Admitting: *Deleted

## 2023-12-08 DIAGNOSIS — E039 Hypothyroidism, unspecified: Secondary | ICD-10-CM

## 2023-12-08 MED ORDER — LEVOTHYROXINE SODIUM 88 MCG PO TABS: 88.0000 ug | ORAL_TABLET | Freq: Every day | ORAL | 0 refills | Status: DC

## 2023-12-25 ENCOUNTER — Telehealth: Payer: Self-pay

## 2023-12-25 NOTE — Telephone Encounter (Signed)
 Prior authorization submitted for Auvelity  45-105 mg #60 with [facility]

## 2023-12-27 NOTE — Telephone Encounter (Signed)
 [id number]. AUVELITY  TAB 45-105MG  is approved through 12/24/2024.

## 2023-12-28 ENCOUNTER — Other Ambulatory Visit: Payer: Self-pay | Admitting: *Deleted

## 2023-12-30 ENCOUNTER — Other Ambulatory Visit (HOSPITAL_COMMUNITY): Payer: Self-pay

## 2023-12-30 ENCOUNTER — Other Ambulatory Visit: Payer: Self-pay | Admitting: Family

## 2023-12-30 ENCOUNTER — Telehealth: Payer: Self-pay | Admitting: Family

## 2023-12-30 DIAGNOSIS — R12 Heartburn: Secondary | ICD-10-CM

## 2023-12-30 MED ORDER — OMEPRAZOLE 20 MG PO CPDR: 20.0000 mg | DELAYED_RELEASE_CAPSULE | Freq: Every day | ORAL | 0 refills | Status: DC

## 2023-12-30 NOTE — Telephone Encounter (Signed)
 Copied from CRM [id number]. Topic: Clinical - Medication Refill >> Dec 30, 2023  9:55 AM Rosamond Comes wrote: Brittney with [facility] [phone]   Medication: omeprazole  (PRILOSEC) 20 MG capsule [  Has the patient contacted their pharmacy? Yes pharmacy called in This is the patient's preferred pharmacy:   [facility] #[id number] [place], [place] - [address] AT [facility] [address] [place] [address] Phone: [phone] Fax: [phone]   Is this the correct pharmacy for this prescription? Yes If no, delete pharmacy and type the correct one.   Has the prescription been filled recently? Yes  Is the patient out of the medication? Yes  Has the patient been seen for an appointment in the last year OR does the patient have an upcoming appointment? No  Can we respond through MyChart? No  Agent: Please be advised that Rx refills may take up to 3 business days. We ask that you follow-up with your pharmacy.

## 2023-12-30 NOTE — Telephone Encounter (Signed)
 Patient requested refill of Omeprazole  to [facility]. This RN attempted to pend medication but it states the medication is being ordered by another source.

## 2023-12-30 NOTE — Telephone Encounter (Signed)
 Please call to pt  Please advise pt she needs a follow up appt was due for 6 month follow up for her diabetes in June.  Sending 30 day refill for her medications but will need appt within the next 30 days to continue refills.

## 2023-12-30 NOTE — Addendum Note (Signed)
 Addended by: Felicita Horns on: 12/30/2023 08:20 PM   Modules accepted: Orders

## 2023-12-30 NOTE — Telephone Encounter (Signed)
 Copied from CRM [id number]. Topic: Clinical - Medication Refill >> Dec 30, 2023 10:49 AM Kita Perish H wrote: Medication: omeprazole  (PRILOSEC) 20 MG capsule  Has the patient contacted their pharmacy? Yes, was told refill was denied (Agent: If no, request that the patient contact the pharmacy for the refill. If patient does not wish to contact the pharmacy document the reason why and proceed with request.) (Agent: If yes, when and what did the pharmacy advise?)  This is the patient's preferred pharmacy:  [facility] #[id number] [place], [place] - [address] AT [facility] OF [address] [place] [address] Phone: [phone] Fax: [phone]   Is this the correct pharmacy for this prescription? Yes If no, delete pharmacy and type the correct one.   Has the prescription been filled recently? No  Is the patient out of the medication? No, 2 left  Has the patient been seen for an appointment in the last year OR does the patient have an upcoming appointment? Yes  Can we respond through MyChart? Yes  Agent: Please be advised that Rx refills may take up to 3 business days. We ask that you follow-up with your pharmacy.

## 2023-12-31 NOTE — Telephone Encounter (Signed)
 Lvmtcb, sent mychart message

## 2024-01-01 NOTE — Telephone Encounter (Signed)
 Lvmtcb

## 2024-01-04 NOTE — Telephone Encounter (Signed)
 Patient is scheduled

## 2024-01-06 ENCOUNTER — Ambulatory Visit: Admitting: Internal Medicine

## 2024-01-07 ENCOUNTER — Other Ambulatory Visit: Payer: Self-pay | Admitting: Behavioral Health

## 2024-01-07 ENCOUNTER — Telehealth: Payer: Self-pay | Admitting: Behavioral Health

## 2024-01-07 DIAGNOSIS — F319 Bipolar disorder, unspecified: Secondary | ICD-10-CM

## 2024-01-07 DIAGNOSIS — F410 Panic disorder [episodic paroxysmal anxiety] without agoraphobia: Secondary | ICD-10-CM

## 2024-01-07 MED ORDER — SERTRALINE HCL 100 MG PO TABS: 200.0000 mg | ORAL_TABLET | Freq: Every day | ORAL | 1 refills | Status: DC

## 2024-01-07 NOTE — Telephone Encounter (Signed)
 Patient notified of correct dose and new Rx was sent.

## 2024-01-07 NOTE — Telephone Encounter (Signed)
 I corrected the medication to reflect 200 mg. She should continue that dose.  Thank you.

## 2024-01-07 NOTE — Telephone Encounter (Signed)
 On 5/21 visit:  Continue Sertraline  200 mg daily   Under medication changes it was changed to 100 mg.  She reports taking 200 mg and wanted to verify dose.

## 2024-01-07 NOTE — Telephone Encounter (Signed)
 Pt called requesting Rx Adderall XR 25 mg to [facility].  Apt 8/20 Pt also reported she has been taking 2 -100 mg Sertraline  daily. Just verify it has changed to 200 mg daily at some point. Pt # [id number].

## 2024-01-11 ENCOUNTER — Other Ambulatory Visit: Payer: Self-pay

## 2024-01-11 ENCOUNTER — Telehealth: Payer: Self-pay | Admitting: Behavioral Health

## 2024-01-11 NOTE — Telephone Encounter (Signed)
 Pt called again today asking for a refill on her adderall xr 25 mg. Pharmacy is [facility] on [address] in [place]. She said she called last week

## 2024-01-11 NOTE — Telephone Encounter (Signed)
 Pended Adderall  XR 25

## 2024-01-12 MED ORDER — AMPHETAMINE-DEXTROAMPHET ER 25 MG PO CP24: 25.0000 mg | ORAL_CAPSULE | ORAL | 0 refills | Status: AC

## 2024-01-13 ENCOUNTER — Encounter: Payer: Self-pay | Admitting: Family

## 2024-01-13 ENCOUNTER — Ambulatory Visit (INDEPENDENT_AMBULATORY_CARE_PROVIDER_SITE_OTHER): Admitting: Family

## 2024-01-13 VITALS — BP 118/82 | HR 75 | Temp 98.0°F | Ht 62.0 in | Wt 267.2 lb

## 2024-01-13 DIAGNOSIS — Z7985 Long-term (current) use of injectable non-insulin antidiabetic drugs: Secondary | ICD-10-CM | POA: Diagnosis not present

## 2024-01-13 DIAGNOSIS — E782 Mixed hyperlipidemia: Secondary | ICD-10-CM | POA: Diagnosis not present

## 2024-01-13 DIAGNOSIS — E559 Vitamin D deficiency, unspecified: Secondary | ICD-10-CM | POA: Diagnosis not present

## 2024-01-13 DIAGNOSIS — E118 Type 2 diabetes mellitus with unspecified complications: Secondary | ICD-10-CM

## 2024-01-13 DIAGNOSIS — F410 Panic disorder [episodic paroxysmal anxiety] without agoraphobia: Secondary | ICD-10-CM

## 2024-01-13 DIAGNOSIS — D72828 Other elevated white blood cell count: Secondary | ICD-10-CM | POA: Diagnosis not present

## 2024-01-13 DIAGNOSIS — E039 Hypothyroidism, unspecified: Secondary | ICD-10-CM

## 2024-01-13 DIAGNOSIS — K2100 Gastro-esophageal reflux disease with esophagitis, without bleeding: Secondary | ICD-10-CM

## 2024-01-13 LAB — LIPID PANEL
Cholesterol: 235 mg/dL — ABNORMAL HIGH (ref 0–200)
HDL: 64 mg/dL (ref >39.00)
LDL Cholesterol: 138 mg/dL — ABNORMAL HIGH (ref 0–99)
NonHDL: 171.33
Total CHOL/HDL Ratio: 4
Triglycerides: 165 mg/dL — ABNORMAL HIGH (ref 0.0–149.0)
VLDL: 33 mg/dL (ref 0.0–40.0)

## 2024-01-13 LAB — COMPREHENSIVE METABOLIC PANEL WITH GFR
ALT: 19 U/L (ref 0–35)
AST: 15 U/L (ref 0–37)
Albumin: 3.9 g/dL (ref 3.5–5.2)
Alkaline Phosphatase: 73 U/L (ref 39–117)
BUN: 14 mg/dL (ref 6–23)
CO2: 28 meq/L (ref 19–32)
Calcium: 9 mg/dL (ref 8.4–10.5)
Chloride: 102 meq/L (ref 96–112)
Creatinine, Ser: 0.9 mg/dL (ref 0.40–1.20)
GFR: 71.56 mL/min (ref >60.00)
Glucose, Bld: 146 mg/dL — ABNORMAL HIGH (ref 70–99)
Potassium: 4.7 meq/L (ref 3.5–5.1)
Sodium: 138 meq/L (ref 135–145)
Total Bilirubin: 0.6 mg/dL (ref 0.2–1.2)
Total Protein: 6.3 g/dL (ref 6.0–8.3)

## 2024-01-13 LAB — MICROALBUMIN / CREATININE URINE RATIO
Creatinine,U: 158.7 mg/dL
Microalb Creat Ratio: UNDETERMINED mg/g (ref 0.0–30.0)
Microalb, Ur: <0.7 mg/dL

## 2024-01-13 LAB — CBC WITH DIFFERENTIAL/PLATELET
Basophils Absolute: 0.1 10*3/uL (ref 0.0–0.1)
Basophils Relative: 0.7 % (ref 0.0–3.0)
Eosinophils Absolute: 0.2 10*3/uL (ref 0.0–0.7)
Eosinophils Relative: 2.7 % (ref 0.0–5.0)
HCT: 39 % (ref 36.0–46.0)
Hemoglobin: 12.9 g/dL (ref 12.0–15.0)
Lymphocytes Relative: 24.4 % (ref 12.0–46.0)
Lymphs Abs: 2.3 10*3/uL (ref 0.7–4.0)
MCHC: 33 g/dL (ref 30.0–36.0)
MCV: 85.4 fl (ref 78.0–100.0)
Monocytes Absolute: 0.5 10*3/uL (ref 0.1–1.0)
Monocytes Relative: 5.6 % (ref 3.0–12.0)
Neutro Abs: 6.2 10*3/uL (ref 1.4–7.7)
Neutrophils Relative %: 66.6 % (ref 43.0–77.0)
Platelets: 304 10*3/uL (ref 150.0–400.0)
RBC: 4.56 Mil/uL (ref 3.87–5.11)
RDW: 13.9 % (ref 11.5–15.5)
WBC: 9.3 10*3/uL (ref 4.0–10.5)

## 2024-01-13 LAB — POCT GLYCOSYLATED HEMOGLOBIN (HGB A1C): Hemoglobin A1C: 6.7 % — AB (ref 4.0–5.6)

## 2024-01-13 LAB — TSH: TSH: 3.7 u[IU]/mL (ref 0.35–5.50)

## 2024-01-13 MED ORDER — PANTOPRAZOLE SODIUM 20 MG PO TBEC
20.0000 mg | DELAYED_RELEASE_TABLET | Freq: Every day | ORAL | 0 refills | Status: DC
Start: 2024-01-13 — End: 2024-04-11

## 2024-01-13 MED ORDER — TIRZEPATIDE 2.5 MG/0.5ML ~~LOC~~ SOAJ: SUBCUTANEOUS | 0 refills | Status: DC

## 2024-01-13 MED ORDER — TIRZEPATIDE 5 MG/0.5ML ~~LOC~~ SOAJ: SUBCUTANEOUS | 2 refills | Status: AC

## 2024-01-13 NOTE — Assessment & Plan Note (Signed)
Cont levothyroxine 88 mcg once daily  Tsh ordered pending results.

## 2024-01-13 NOTE — Assessment & Plan Note (Addendum)
 A1c in office today 6.7. Trial start mounjaro 2.5 mg weekly If does not tolerate can try sglt2 and or DPP 4 Urine microalbumin ordered today as well

## 2024-01-13 NOTE — Progress Notes (Unsigned)
 Established Patient Office Visit  Subjective:      CC:  Chief Complaint  Patient presents with   Medical Management of Chronic Issues    Discuss Progesterone cream    HPI: Dominique Nunez is a 58 y.o. female presenting on 01/13/2024 for Medical Management of Chronic Issues (Discuss Progesterone cream) . DM2: was taking metformin  but it made her 'so sick' and also was taking the ozempic  but that also made her feel horrible.   Hypothyroid: taking levothyroxine  88 mcg once daily.   Obesity: still walking throughout the week.   GERD: on omeprazole  however starting to get breakthrough heartburn. She does try to fixate her diet on food that doesn't aggravate the heartburn, red sauce is the worst for her and fettuccine sauce.    Social history:  Relevant past medical, surgical, family and social history reviewed and updated as indicated. Interim medical history since our last visit reviewed.  Allergies and medications reviewed and updated.  DATA REVIEWED: CHART IN EPIC     ROS: Negative unless specifically indicated above in HPI.    Current Outpatient Medications:    albuterol  (VENTOLIN  HFA) 108 (90 Base) MCG/ACT inhaler, Inhale 1-2 puffs into the lungs every 6 (six) hours as needed., Disp: 18 g, Rfl: 0   amphetamine -dextroamphetamine (ADDERALL XR) 25 MG 24 hr capsule, Take 1 capsule by mouth every morning., Disp: 30 capsule, Rfl: 0   Blood Glucose Monitoring Suppl (ACCU-CHEK GUIDE ME) w/Device KIT, See admin instructions. follow package directions, Disp: , Rfl:    cariprazine  (VRAYLAR ) 3 MG capsule, Take 1 capsule (3 mg total) by mouth daily., Disp: 90 capsule, Rfl: 1   cetirizine (ZYRTEC) 10 MG tablet, Take 10 mg by mouth daily., Disp: , Rfl:    clonazePAM  (KLONOPIN ) 0.5 MG disintegrating tablet, Take 1 tablet (0.5 mg total) by mouth daily as needed., Disp: 30 tablet, Rfl: 3   Dextromethorphan-buPROPion  ER (AUVELITY ) 45-105 MG TBCR, Take one tablet by mouth for 3 days, then  take two tablets daily 7-8 hours between doses., Disp: 60 tablet, Rfl: 3   DOTTI 0.1 MG/24HR patch, Place onto the skin 2 (two) times a week., Disp: , Rfl:    fluticasone  (FLONASE ) 50 MCG/ACT nasal spray, Place 1 spray into both nostrils 2 (two) times daily., Disp: 16 g, Rfl: 5   Glucose Blood (BLOOD GLUCOSE TEST STRIPS) STRP, Check glucose prn symptoms May substitute to any manufacturer covered by patient's insurance., Disp: 100 strip, Rfl: 0   ibuprofen (ADVIL,MOTRIN) 200 MG tablet, Take 800 mg by mouth daily as needed for moderate pain., Disp: , Rfl:    Ivermectin (SOOLANTRA) 1 % CREA, Soolantra 1 % topical cream, Disp: , Rfl:    Lancets MISC, Check glucose po every day prn symptoms Defer to any manufacturer covered with pt insurance, Disp: 100 each, Rfl: 0   levothyroxine  (SYNTHROID ) 88 MCG tablet, Take 1 tablet (88 mcg total) by mouth daily., Disp: 90 tablet, Rfl: 0   pantoprazole (PROTONIX) 20 MG tablet, Take 1 tablet (20 mg total) by mouth daily., Disp: 90 tablet, Rfl: 0   rizatriptan  (MAXALT ) 10 MG tablet, TAKE 1 TABLET BY MOUTH AS NEEDED FOR MIGRAINE. MAY REPEAT IN 2  HOURS IF NEEDED, Disp: 10 tablet, Rfl: 0   sertraline  (ZOLOFT ) 100 MG tablet, Take 2 tablets (200 mg total) by mouth daily., Disp: 180 tablet, Rfl: 1   tirzepatide (MOUNJARO) 2.5 MG/0.5ML Pen, Inject 2.5 mg [place] once weekly for four weeks, then increase to 5.0 mg [place]  weekly (new prescription), Disp: 2 mL, Rfl: 0   [START ON 02/03/2024] tirzepatide [facility]) 5 MG/0.5ML Pen, After completing four weeks of 2.5 mg qweek [place], start 5.0 mg weekly qweekly, Disp: 2 mL, Rfl: 2   zolpidem  (AMBIEN ) 10 MG tablet, Take 1 tablet (10 mg total) by mouth at bedtime as needed for sleep., Disp: 30 tablet, Rfl: 2+      Objective:    BP 118/82   Pulse 75   Temp 98 F (36.7 C)   Ht 5' 2 (1.575 m)   Wt 267 lb 3.2 oz (121.2 kg)   SpO2 97%   BMI 48.87 kg/m   Wt Readings from Last 3 Encounters:  01/13/24 267 lb 3.2 oz (121.2 kg)  09/29/23 264  lb (119.7 kg)  05/04/23 258 lb (117 kg)    Physical Exam Constitutional:      General: She is not in acute distress.    Appearance: Normal appearance. She is normal weight. She is not ill-appearing, toxic-appearing or diaphoretic.  HENT:     Head: Normocephalic.  Cardiovascular:     Rate and Rhythm: Normal rate and regular rhythm.  Pulmonary:     Effort: Pulmonary effort is normal.  Musculoskeletal:        General: Normal range of motion.     Right lower leg: No edema.     Left lower leg: No edema.  Neurological:     General: No focal deficit present.     Mental Status: She is alert and oriented to person, place, and time. Mental status is at baseline.  Psychiatric:        Mood and Affect: Mood normal.        Behavior: Behavior normal.        Thought Content: Thought content normal.        Judgment: Judgment normal.           Assessment & Plan:  Acquired hypothyroidism Assessment & Plan: Cont levothyroxine  88 mcg once daily  Tsh ordered pending results.   Orders: -     TSH  Controlled type 2 diabetes mellitus with complication, without long-term current use of insulin (HCC) Assessment & Plan: A1c in office today 6.7. Trial start mounjaro 2.5 mg weekly If does not tolerate can try sglt2 and or DPP 4 Urine microalbumin ordered today as well   Orders: -     Microalbumin / creatinine urine ratio -     Comprehensive metabolic panel with GFR -     Tirzepatide; After completing four weeks of 2.5 mg qweek [place], start 5.0 mg weekly qweekly  Dispense: 2 mL; Refill: 2 -     Tirzepatide; Inject 2.5 mg [place] once weekly for four weeks, then increase to 5.0 mg [place] weekly (new prescription)  Dispense: 2 mL; Refill: 0  Vitamin D  deficiency  Mixed hyperlipidemia Assessment & Plan: Ordered lipid panel, pending results. Work on low cholesterol diet and exercise as tolerated   Orders: -     Lipid panel  Generalized anxiety disorder with panic attacks  Other elevated white  blood cell (WBC) count -     CBC with Differential/Platelet  Gastroesophageal reflux disease with esophagitis without hemorrhage Assessment & Plan: Stop omeprazole  start pantoprazole  Try to decrease and or avoid spicy foods, fried fatty foods, and also caffeine and chocolate as these can increase heartburn symptoms.    Orders: -     Pantoprazole Sodium; Take 1 tablet (20 mg total) by mouth daily.  Dispense: 90  tablet; Refill: 0     Return in about 6 months (around 07/15/2024) for f/u CPE.  Ginger Patrick, MSN, APRN, FNP-C [facility]

## 2024-01-13 NOTE — Assessment & Plan Note (Signed)
 Stop omeprazole  start pantoprazole  Try to decrease and or avoid spicy foods, fried fatty foods, and also caffeine and chocolate as these can increase heartburn symptoms.

## 2024-01-13 NOTE — Assessment & Plan Note (Signed)
 Ordered lipid panel, pending results. Work on low cholesterol diet and exercise as tolerated

## 2024-01-18 ENCOUNTER — Ambulatory Visit: Payer: Self-pay | Admitting: Family

## 2024-01-18 DIAGNOSIS — E782 Mixed hyperlipidemia: Secondary | ICD-10-CM

## 2024-01-18 MED ORDER — ROSUVASTATIN CALCIUM 10 MG PO TABS: 10.0000 mg | ORAL_TABLET | Freq: Every day | ORAL | 3 refills | Status: AC

## 2024-02-08 ENCOUNTER — Ambulatory Visit: Admitting: Internal Medicine

## 2024-02-16 DIAGNOSIS — Z1283 Encounter for screening for malignant neoplasm of skin: Secondary | ICD-10-CM | POA: Diagnosis not present

## 2024-02-16 DIAGNOSIS — D2262 Melanocytic nevi of left upper limb, including shoulder: Secondary | ICD-10-CM | POA: Diagnosis not present

## 2024-02-16 DIAGNOSIS — D2272 Melanocytic nevi of left lower limb, including hip: Secondary | ICD-10-CM | POA: Diagnosis not present

## 2024-02-16 DIAGNOSIS — D2261 Melanocytic nevi of right upper limb, including shoulder: Secondary | ICD-10-CM | POA: Diagnosis not present

## 2024-02-16 DIAGNOSIS — L570 Actinic keratosis: Secondary | ICD-10-CM | POA: Diagnosis not present

## 2024-02-29 ENCOUNTER — Other Ambulatory Visit: Payer: Self-pay | Admitting: *Deleted

## 2024-02-29 DIAGNOSIS — E039 Hypothyroidism, unspecified: Secondary | ICD-10-CM

## 2024-02-29 MED ORDER — LEVOTHYROXINE SODIUM 88 MCG PO TABS: 88.0000 ug | ORAL_TABLET | Freq: Every day | ORAL | 1 refills | Status: DC

## 2024-03-02 ENCOUNTER — Ambulatory Visit: Admitting: Behavioral Health

## 2024-03-02 ENCOUNTER — Encounter: Payer: Self-pay | Admitting: Behavioral Health

## 2024-03-02 DIAGNOSIS — F909 Attention-deficit hyperactivity disorder, unspecified type: Secondary | ICD-10-CM | POA: Diagnosis not present

## 2024-03-02 DIAGNOSIS — F331 Major depressive disorder, recurrent, moderate: Secondary | ICD-10-CM

## 2024-03-02 DIAGNOSIS — F319 Bipolar disorder, unspecified: Secondary | ICD-10-CM

## 2024-03-02 DIAGNOSIS — F5105 Insomnia due to other mental disorder: Secondary | ICD-10-CM | POA: Diagnosis not present

## 2024-03-02 DIAGNOSIS — F411 Generalized anxiety disorder: Secondary | ICD-10-CM | POA: Diagnosis not present

## 2024-03-02 DIAGNOSIS — F99 Mental disorder, not otherwise specified: Secondary | ICD-10-CM

## 2024-03-02 DIAGNOSIS — F410 Panic disorder [episodic paroxysmal anxiety] without agoraphobia: Secondary | ICD-10-CM

## 2024-03-02 MED ORDER — AMPHETAMINE-DEXTROAMPHET ER 30 MG PO CP24
30.0000 mg | ORAL_CAPSULE | Freq: Every day | ORAL | 0 refills | Status: AC
Start: 2024-03-02 — End: 2024-04-01

## 2024-03-02 MED ORDER — ZOLPIDEM TARTRATE 10 MG PO TABS: 10.0000 mg | ORAL_TABLET | Freq: Every evening | ORAL | 2 refills | Status: DC | PRN

## 2024-03-02 MED ORDER — AUVELITY 45-105 MG PO TBCR: EXTENDED_RELEASE_TABLET | ORAL | 3 refills | Status: DC

## 2024-03-02 MED ORDER — CARIPRAZINE HCL 3 MG PO CAPS
3.0000 mg | ORAL_CAPSULE | Freq: Every day | ORAL | 1 refills | Status: AC
Start: 2024-03-02 — End: ?

## 2024-03-02 MED ORDER — CLONAZEPAM 0.5 MG PO TBDP: 0.5000 mg | ORAL_TABLET | Freq: Every day | ORAL | 3 refills | Status: AC | PRN

## 2024-03-02 MED ORDER — SERTRALINE HCL 100 MG PO TABS: 200.0000 mg | ORAL_TABLET | Freq: Every day | ORAL | 1 refills | Status: DC

## 2024-03-02 NOTE — Progress Notes (Signed)
 [facility]  Patient ID: PATTYE MEDA,  MRN: [id number]  PCP: Corwin Antu, FNP  Date of Evaluation: 03/02/2024 Time spent:30 minutes  Chief Complaint:  Chief Complaint   Anxiety; Depression; ADHD; Follow-up; Patient Education; Medication Refill     HISTORY/CURRENT STATUS: HPI Dominique Nunez, 58  year old female presents to this for follow up  and medication management. No changes this visit.  Still liking Auvelity  and feels the medication is working well.  She reports feeling discouraged and mentally fatigue due to poor work environment. She is trying to make decisions on what steps to take next. For now in agreement that adjusting medications not  best course. She will be working to improve her situation.  Reports depression  at  4/10 and anxiety is 2/10. She denies mania, no psychosis, no auditory or visual hallucinations.  No SI or HI.    Individual Medical History/ Review of Systems: Changes? :No   Allergies: Metformin  and related and Ozempic  (0.25 or 0.5 mg-dose) [semaglutide (0.25 or 0.5mg -dos)]  Current Medications:  Current Outpatient Medications:    amphetamine -dextroamphetamine (ADDERALL XR) 30 MG 24 hr capsule, Take 1 capsule (30 mg total) by mouth daily., Disp: 30 capsule, Rfl: 0   albuterol  (VENTOLIN  HFA) 108 (90 Base) MCG/ACT inhaler, Inhale 1-2 puffs into the lungs every 6 (six) hours as needed., Disp: 18 g, Rfl: 0   amphetamine -dextroamphetamine (ADDERALL XR) 25 MG 24 hr capsule, Take 1 capsule by mouth every morning., Disp: 30 capsule, Rfl: 0   Blood Glucose Monitoring Suppl (ACCU-CHEK GUIDE ME) w/Device KIT, See admin instructions. follow package directions, Disp: , Rfl:    cariprazine  (VRAYLAR ) 3 MG capsule, Take 1 capsule (3 mg total) by mouth daily., Disp: 90 capsule, Rfl: 1   cetirizine (ZYRTEC) 10 MG tablet, Take 10 mg by mouth daily., Disp: , Rfl:    clonazePAM  (KLONOPIN ) 0.5 MG disintegrating tablet, Take 1 tablet (0.5 mg total) by mouth daily as  needed., Disp: 30 tablet, Rfl: 3   Dextromethorphan-buPROPion  ER (AUVELITY ) 45-105 MG TBCR, Take two tablets by mouth daily 7-8 hours between doses., Disp: 60 tablet, Rfl: 3   DOTTI 0.1 MG/24HR patch, Place onto the skin 2 (two) times a week., Disp: , Rfl:    fluticasone  (FLONASE ) 50 MCG/ACT nasal spray, Place 1 spray into both nostrils 2 (two) times daily., Disp: 16 g, Rfl: 5   Glucose Blood (BLOOD GLUCOSE TEST STRIPS) STRP, Check glucose prn symptoms May substitute to any manufacturer covered by patient's insurance., Disp: 100 strip, Rfl: 0   ibuprofen (ADVIL,MOTRIN) 200 MG tablet, Take 800 mg by mouth daily as needed for moderate pain., Disp: , Rfl:    Ivermectin (SOOLANTRA) 1 % CREA, Soolantra 1 % topical cream, Disp: , Rfl:    Lancets MISC, Check glucose po every day prn symptoms Defer to any manufacturer covered with pt insurance, Disp: 100 each, Rfl: 0   levothyroxine  (SYNTHROID ) 88 MCG tablet, Take 1 tablet (88 mcg total) by mouth daily., Disp: 90 tablet, Rfl: 1   pantoprazole  (PROTONIX ) 20 MG tablet, Take 1 tablet (20 mg total) by mouth daily., Disp: 90 tablet, Rfl: 0   rizatriptan  (MAXALT ) 10 MG tablet, TAKE 1 TABLET BY MOUTH AS NEEDED FOR MIGRAINE. MAY REPEAT IN 2  HOURS IF NEEDED, Disp: 10 tablet, Rfl: 0   rosuvastatin  (CRESTOR ) 10 MG tablet, Take 1 tablet (10 mg total) by mouth daily., Disp: 90 tablet, Rfl: 3   sertraline  (ZOLOFT ) 100 MG tablet, Take 2 tablets (200 mg total) by  mouth daily., Disp: 180 tablet, Rfl: 1   tirzepatide  (MOUNJARO ) 2.5 MG/0.5ML Pen, Inject 2.5 mg [place] once weekly for four weeks, then increase to 5.0 mg [place] weekly (new prescription), Disp: 2 mL, Rfl: 0   tirzepatide  (MOUNJARO ) 5 MG/0.5ML Pen, After completing four weeks of 2.5 mg qweek [place], start 5.0 mg weekly qweekly, Disp: 2 mL, Rfl: 2   zolpidem  (AMBIEN ) 10 MG tablet, Take 1 tablet (10 mg total) by mouth at bedtime as needed for sleep., Disp: 30 tablet, Rfl: 2 Medication Side Effects: none  Family Medical/  Social History: Changes? No  MENTAL HEALTH EXAM:  There were no vitals taken for this visit.There is no height or weight on file to calculate BMI.  General Appearance: Casual, Neat, and Well Groomed  Eye Contact:  Good  Speech:  Clear and Coherent  Volume:  Normal  Mood:  NA  Affect:  Appropriate  Thought Process:  Coherent  Orientation:  Full (Time, Place, and Person)  Thought Content: Logical   Suicidal Thoughts:  No  Homicidal Thoughts:  No  Memory:  WNL  Judgement:  Good  Insight:  Good  Psychomotor Activity:  Normal  Concentration:  Concentration: Good  Recall:  Good  Fund of Knowledge: Good  Language: Good  Assets:  Desire for Improvement  ADL's:  Intact  Cognition: WNL  Prognosis:  Good    DIAGNOSES:    ICD-10-CM   1. Bipolar 1 disorder, depressed (HCC)  F31.9 sertraline  (ZOLOFT ) 100 MG tablet    cariprazine  (VRAYLAR ) 3 MG capsule    Dextromethorphan-buPROPion  ER (AUVELITY ) 45-105 MG TBCR    2. Generalized anxiety disorder with panic attacks  F41.1 sertraline  (ZOLOFT ) 100 MG tablet   F41.0 cariprazine  (VRAYLAR ) 3 MG capsule    3. Insomnia due to other mental disorder  F51.05 zolpidem  (AMBIEN ) 10 MG tablet   F99     4. Attention deficit hyperactivity disorder (ADHD), unspecified ADHD type  F90.9 amphetamine -dextroamphetamine (ADDERALL XR) 30 MG 24 hr capsule    5. Major depressive disorder, recurrent episode, moderate (HCC)  F33.1 clonazePAM  (KLONOPIN ) 0.5 MG disintegrating tablet      Receiving Psychotherapy: No    RECOMMENDATIONS:   Greater than 50% of  30 min face to face time with patient was spent on counseling and coordination of care. A little distressed today due to frustration with her job. Feels hopeless but understands she needs to make changes. We discussed her report of Adderall not being effective now. She is interested in trying to increase this visit.    We agreed to:  AIMS=0    03/02/24  Continue Vraylar  3 mg daily Continue  Klonopin   0.5 mg daily as needed Continue Sertraline  200 mg daily Continue Auvelity   90-210 mg daily.  Continue Ambien  10 mg at bedtime Continue Adderall to 30  mg ER daily   To call for worsening symptoms or side effects Provided emergency contact information Reviewed PDMP   Dominique DELENA Pizza, NP           Dominique DELENA Pizza, NP

## 2024-03-07 DIAGNOSIS — Z8619 Personal history of other infectious and parasitic diseases: Secondary | ICD-10-CM | POA: Diagnosis not present

## 2024-03-07 DIAGNOSIS — Z09 Encounter for follow-up examination after completed treatment for conditions other than malignant neoplasm: Secondary | ICD-10-CM | POA: Diagnosis not present

## 2024-03-07 DIAGNOSIS — R87610 Atypical squamous cells of undetermined significance on cytologic smear of cervix (ASC-US): Secondary | ICD-10-CM | POA: Diagnosis not present

## 2024-03-15 ENCOUNTER — Encounter: Payer: Self-pay | Admitting: Internal Medicine

## 2024-03-15 ENCOUNTER — Ambulatory Visit: Attending: Internal Medicine | Admitting: Internal Medicine

## 2024-03-15 VITALS — BP 127/79 | HR 81 | Resp 16 | Ht 62.0 in | Wt 264.4 lb

## 2024-03-15 DIAGNOSIS — M159 Polyosteoarthritis, unspecified: Secondary | ICD-10-CM | POA: Diagnosis not present

## 2024-03-15 DIAGNOSIS — R700 Elevated erythrocyte sedimentation rate: Secondary | ICD-10-CM | POA: Diagnosis not present

## 2024-03-15 DIAGNOSIS — L719 Rosacea, unspecified: Secondary | ICD-10-CM | POA: Diagnosis not present

## 2024-03-15 NOTE — Progress Notes (Signed)
 Office Visit Note  Patient: Dominique Nunez             Date of Birth: 1966-02-08           MRN: [id number]             PCP: Corwin Antu, FNP Referring: Corwin Antu, FNP Visit Date: 03/15/2024   Subjective:  Follow-up (Knees and back)    Discussed the use of AI scribe software for clinical note transcription with the patient, who gave verbal consent to proceed.  History of Present Illness   Dominique Nunez is a 58 year old female who presents with persistent joint pain and swelling.  She has chronic joint pain primarily affecting her knees, back, and shoulders. Her knees are described as 'shot,' and she mentions that her whole body sometimes hurts. She received hyaluronic acid injections in her knees approximately eight to nine months ago, which provided relief for a couple of months, but the pain has since returned and is described as 'like a toothache.' She takes Aleve, ibuprofen, and Tylenol  occasionally for pain relief, but sometimes opts not to take anything and just rests.  She reports that her knees swell a lot and her ankles are swelling; her hands used to swell a lot but do not swell as badly as they used to. Her right shoulder has been painful for four to five months without any specific injury, and raising her arm can be painful.  She reports worsening fatigue and poor sleep, waking up every hour without any apparent cause. She occasionally takes Ambien  when she feels she cannot sleep. She has not undergone a sleep study.  She experiences constipation, which she attributes to starting Mounjaro . She has been able to maintain regular bowel movements in the morning but has started taking Metamucil to help with this issue.   Previous HPI 02/10/23 Dominique Nunez is a 58 y.o. female here for evaluation of elevated CRP checked in association with chronic joint pain in multiple areas.  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  Never required any formal physical therapy injection or surgical intervention for this.  Also has longstanding joint pain in both knees has been seeing [facility] for this in the past 6 years.  Notices a lot of clicking and popping especially with deep bending knee movements.  She has increased trouble with climbing stairs due to pain.  Tried several rounds of cortisone injections with benefit more recently had viscosupplementation injections last was about 2 years ago.  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  Amount of pain is typically worse at end of the day and dependent on the amount of use.  She sees visible swelling in her hands and feet usually worse by the end of the day.  No particular redness or discoloration.  She has a family history of osteoarthritis on her mother side.  She does not take any daily oral anti-inflammatory medicines and sometimes uses Tylenol  as needed. Besides joint pain she has chronic rash with facial rosacea uses topical ivermectin which is beneficial but usually gets flareups again afterwards.  Has some darkening and some spots in multiple areas but no other erythematous rashes. Denies any history of oral nasal ulcers, chronic lymphadenopathy, focal alopecia, or Raynaud's symptoms.  Does not have any  history of abnormal bleeding or blood clots. Had hysterectomy at age 48 due to prolapse after her second pregnancy.  Had bilateral oophorectomy about 6 years ago.   Labs reviewed CRP 18 ANA neg RF neg ESR 34   Review of Systems  Constitutional:  Positive for fatigue.  HENT:  Negative for mouth sores and mouth dryness.   Eyes:  Negative for dryness.  Respiratory:  Negative for shortness of breath.   Cardiovascular:  Negative for chest pain and palpitations.  Gastrointestinal:  Positive for constipation.  Negative for blood in stool and diarrhea.  Endocrine: Negative for increased urination.  Genitourinary:  Positive for involuntary urination.  Musculoskeletal:  Positive for joint pain, joint pain, joint swelling, myalgias, muscle weakness, morning stiffness, muscle tenderness and myalgias. Negative for gait problem.  Skin:  Positive for hair loss. Negative for color change, rash and sensitivity to sunlight.  Allergic/Immunologic: Negative for susceptible to infections.  Neurological:  Positive for headaches. Negative for dizziness.  Hematological:  Negative for swollen glands.  Psychiatric/Behavioral:  Positive for depressed mood and sleep disturbance. The patient is not nervous/anxious.     PMFS History:  Patient Active Problem List   Diagnosis Date Noted   Elevated sedimentation rate 03/15/2024   Gastroesophageal reflux disease with esophagitis without hemorrhage 01/13/2024   Bilateral hand pain 02/10/2023   Generalized osteoarthritis of multiple sites 02/10/2023   Controlled type 2 diabetes mellitus with complication, without long-term current use of insulin (HCC) 01/05/2023   History of melanoma 09/11/2022   Bipolar 1 disorder, depressed (HCC) 02/25/2022   Generalized anxiety disorder with panic attacks 02/25/2022   Hyperlipidemia 03/28/2020   Morbid obesity (HCC) 03/28/2020   Insomnia 10/13/2019   Major depression, recurrent (HCC) 08/21/2019   Attention deficit disorder (ADD) 12/21/2017   CRP elevated 06/16/2016   Chronic arthralgias of knees and hips 05/22/2016   Vitamin D  deficiency 02/14/2015   Hypothyroidism 12/26/2014    Past Medical History:  Diagnosis Date   Anxiety    Bipolar disorder (HCC)    Depression    GERD (gastroesophageal reflux disease)    Headache    Heart murmur    History of fainting spells of unknown cause    Hypothyroidism    Kidney stones    Melanoma (HCC)    right arm   Migraine    PONV (postoperative nausea and vomiting)    Thyroid  disease      Family History  Problem Relation Age of Onset   Ovarian cancer Mother    Asthma Mother    COPD Mother    Breast cancer Mother 61   Alcohol abuse Father    Kidney disease Father    Diabetes Father    Bipolar disorder Father        suicide attempt   Anxiety disorder Father    Depression Father    Kidney disease Brother    Hepatitis C Brother    Alcohol abuse Brother    Bipolar disorder Brother    Anxiety disorder Brother    Depression Brother    Alcohol abuse Maternal Grandfather    Alcohol abuse Paternal Grandfather    Arthritis Paternal Grandfather    Heart disease Paternal Grandfather    Stroke Paternal Grandfather    Pancreatic cancer Paternal Grandfather    Stroke Paternal Aunt    Breast cancer Paternal Aunt    Past Surgical History:  Procedure Laterality Date   ABDOMINAL HYSTERECTOMY  2000   complete   BREAST BIOPSY Left 1984  CHOLECYSTECTOMY  2000   COLONOSCOPY WITH PROPOFOL  N/A 10/04/2021   Procedure: COLONOSCOPY WITH PROPOFOL ;  Surgeon: Therisa Bi, MD;  Location[facility] END[facility];  Service: Gastroenterology;  Laterality: N/A;   CYSTOSCOPY N/A 06/26/2016   Procedure: CYSTOSCOPY;  Surgeon: Norleen Skill, MD;  Location: [facility] ORS;  Service: Gynecology;  Laterality: N/A;   INNER EAR SURGERY     LAPAROSCOPIC SALPINGO OOPHERECTOMY Bilateral 06/26/2016   Procedure: LAPAROSCOPIC SALPINGO OOPHORECTOMY,BILATERAL;  Surgeon: Norleen Skill, MD;  Location: [facility];  Service: Gynecology;  Laterality: Bilateral;   TONSILLECTOMY AND ADENOIDECTOMY  1976   WISDOM TOOTH EXTRACTION     Social History   Social History Narrative   10/13/19   From: the area   Living: with Husband Ryan (1994)   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   Dog: Publishing copy      Family: 2 adult children - [place] and [place]       Enjoys: relax, walking, reading      Exercise: walking the dog and with husbant   Diet: 2 green veggies a night, meat, does like snacks      Safety   Seat  belts: Yes    Guns: Yes  and secure   Safe in relationships: Yes    Immunization History  Administered Date(s) Administered   Influenza, Seasonal, Injecte, Preservative Fre 05/04/2023   Influenza,inj,Quad PF,6+ Mos 04/30/2017, 04/01/2021   Tdap 09/18/2016     Objective: Vital Signs: BP 127/79 (BP Location: Left Arm, Patient Position: Sitting, Cuff Size: Normal)   Pulse 81   Resp 16   Ht 5' 2 (1.575 m)   Wt 264 lb 6.4 oz (119.9 kg)   BMI 48.36 kg/m    Physical Exam Eyes:     Conjunctiva/sclera: Conjunctivae normal.  Cardiovascular:     Rate and Rhythm: Normal rate and regular rhythm.  Pulmonary:     Effort: Pulmonary effort is normal.     Breath sounds: Normal breath sounds.  Skin:    General: Skin is warm and dry.     Comments: Dandruff on scalp Mild central facial erythema  Neurological:     Mental Status: She is alert.  Psychiatric:        Mood and Affect: Mood normal.      Musculoskeletal Exam:  Shoulders full ROM no tenderness or swelling Elbows full ROM no tenderness or swelling Wrists full ROM no swelling Fingers full ROM no tenderness or swelling Diffuse low back pain midline and bilateral paraspinal muscles lumbar spine Knees full ROM no tenderness or swelling, patellofemoral crepitus R > L Ankles full ROM no tenderness or swelling   Investigation: No additional findings.  Imaging: No results found.  Recent Labs: Lab Results  Component Value Date   WBC 9.3 01/13/2024   HGB 12.9 01/13/2024   PLT 304.0 01/13/2024   NA 138 01/13/2024   K 4.7 01/13/2024   CL 102 01/13/2024   CO2 28 01/13/2024   GLUCOSE 146 (H) 01/13/2024   BUN 14 01/13/2024   CREATININE 0.90 01/13/2024   BILITOT 0.6 01/13/2024   ALKPHOS 73 01/13/2024   AST 15 01/13/2024   ALT 19 01/13/2024   PROT 6.3 01/13/2024   ALBUMIN 3.9 01/13/2024   CALCIUM  9.0 01/13/2024   GFRAA 71 03/28/2020    Speciality Comments: No specialty comments available.  Procedures:  No procedures  performed Allergies: Metformin  and related and Ozempic  (0.25 or 0.5 mg-dose) [semaglutide (0.25 or 0.5mg -dos)]   Assessment / Plan:     Visit  Diagnoses: Generalized osteoarthritis of multiple sites  Elevated sed rate - Plan: DULoxetine  (CYMBALTA ) 30 MG  Chronic joint pain in knees and shoulders with low back pain. Mildly elevated inflammatory markers suggest nonspecific inflammation. Symptoms align with osteoarthritis.  At this time I cannot confirm any particular inflammatory arthritis but would recommend a recheck of her previous elevated inflammatory markers. - Recheck inflammatory markers to monitor trends. - Provided information on duloxetine  for joint pain. - Consider duloxetine  if markers stable and symptoms persist--main concern for side effects would be due to her concurrent high dose of sertraline  so have to monitor for serotonergic symptoms, and if intolerant consider a gabapentinoid or other alternatives  Rosacea Chronic facial redness managed with topical treatments, providing temporary relief.  Chronic insomnia Worsening insomnia with frequent awakenings, exacerbating musculoskeletal symptoms. - Consider duloxetine  for potential improvement in sleep quality and joint pain.  Constipation associated with GLP-1 agonist use Constipation likely due to Mounjaro  use, slowing gut motility.       Orders: Orders Placed This Encounter  Procedures   Sedimentation rate   C-reactive protein   Meds ordered this encounter  Medications   DULoxetine  (CYMBALTA ) 30 MG capsule    Sig: Take 1 capsule (30 mg total) by mouth daily.    Dispense:  30 capsule    Refill:  2     Follow-Up Instructions: Return in about 3 months (around 06/14/2024) for OA/?inflammation f/u 3mos.   Lonni LELON Ester, MD  Note - This record has been created using [employer].  Chart creation errors have been sought, but may not always  have been located. Such creation errors do not reflect on  the  standard of medical care.

## 2024-03-16 LAB — SEDIMENTATION RATE: Sed Rate: 28 mm/h (ref 0–30)

## 2024-03-16 LAB — C-REACTIVE PROTEIN: CRP: 12.4 mg/L — ABNORMAL HIGH (ref <8.0)

## 2024-03-17 ENCOUNTER — Telehealth: Payer: Self-pay | Admitting: Behavioral Health

## 2024-03-17 NOTE — Telephone Encounter (Signed)
 Next visit is 06/02/24. Edelin called requesting a refill on her Adderall called to:  [id number] [address], [place] - [address] AT [facility] [address]   Phone: [phone]  Fax: [phone]

## 2024-03-17 NOTE — Telephone Encounter (Signed)
 Pt has a RF available at the requested pharmacy for a start date of 8/20. LVM per DPR with this information.

## 2024-03-22 ENCOUNTER — Ambulatory Visit: Payer: Self-pay | Admitting: Internal Medicine

## 2024-03-22 NOTE — Progress Notes (Signed)
 Results are improved with CRP down to 12.4 and sed rate remains normal. I suspect symptoms are more related to osteoarthritis than an inflammatory process. If she wants we can try the low dose duloxetine like we discussed for her chronic joint and muscle pain.

## 2024-03-30 MED ORDER — DULOXETINE HCL 30 MG PO CPEP: 30.0000 mg | ORAL_CAPSULE | Freq: Every day | ORAL | 2 refills | Status: DC

## 2024-04-11 ENCOUNTER — Other Ambulatory Visit: Payer: Self-pay | Admitting: *Deleted

## 2024-04-11 DIAGNOSIS — K2100 Gastro-esophageal reflux disease with esophagitis, without bleeding: Secondary | ICD-10-CM

## 2024-04-11 MED ORDER — PANTOPRAZOLE SODIUM 20 MG PO TBEC
20.0000 mg | DELAYED_RELEASE_TABLET | Freq: Every day | ORAL | 0 refills | Status: AC
Start: 2024-04-11 — End: ?

## 2024-04-13 ENCOUNTER — Ambulatory Visit: Admitting: Family

## 2024-04-13 ENCOUNTER — Encounter: Payer: Self-pay | Admitting: Family

## 2024-04-13 VITALS — BP 108/82 | HR 86 | Temp 97.8°F | Ht 62.0 in | Wt 258.6 lb

## 2024-04-13 DIAGNOSIS — Z23 Encounter for immunization: Secondary | ICD-10-CM

## 2024-04-13 DIAGNOSIS — E559 Vitamin D deficiency, unspecified: Secondary | ICD-10-CM

## 2024-04-13 DIAGNOSIS — Z0001 Encounter for general adult medical examination with abnormal findings: Secondary | ICD-10-CM | POA: Diagnosis not present

## 2024-04-13 DIAGNOSIS — E039 Hypothyroidism, unspecified: Secondary | ICD-10-CM | POA: Diagnosis not present

## 2024-04-13 DIAGNOSIS — R5383 Other fatigue: Secondary | ICD-10-CM | POA: Diagnosis not present

## 2024-04-13 DIAGNOSIS — K2100 Gastro-esophageal reflux disease with esophagitis, without bleeding: Secondary | ICD-10-CM

## 2024-04-13 DIAGNOSIS — E119 Type 2 diabetes mellitus without complications: Secondary | ICD-10-CM

## 2024-04-13 DIAGNOSIS — E782 Mixed hyperlipidemia: Secondary | ICD-10-CM | POA: Diagnosis not present

## 2024-04-13 DIAGNOSIS — E118 Type 2 diabetes mellitus with unspecified complications: Secondary | ICD-10-CM

## 2024-04-13 DIAGNOSIS — Z79899 Other long term (current) drug therapy: Secondary | ICD-10-CM

## 2024-04-13 DIAGNOSIS — Z6841 Body Mass Index (BMI) 40.0 and over, adult: Secondary | ICD-10-CM

## 2024-04-13 LAB — HEMOGLOBIN A1C: Hgb A1c MFr Bld: 6.3 % (ref 4.6–6.5)

## 2024-04-13 LAB — TSH: TSH: 3.23 u[IU]/mL (ref 0.35–5.50)

## 2024-04-13 MED ORDER — FAMOTIDINE 10 MG PO TABS: 10.0000 mg | ORAL_TABLET | Freq: Two times a day (BID) | ORAL | 2 refills | Status: AC

## 2024-04-13 NOTE — Progress Notes (Signed)
 Subjective:  Patient ID: Dominique Nunez, female    DOB: 1966-05-19  Age: 58 y.o. MRN: [id number]  Patient Care Team: Corwin Antu, FNP as PCP - General (Family Medicine)   CC:  Chief Complaint  Patient presents with   Annual Exam    HPI Dominique Nunez is a 58 y.o. female who presents today for an annual physical exam. She reports consuming a general diet. She exercises regularly walks throughout the week She generally feels well. She reports sleeping fairly well. She does not have additional problems to discuss today.   Vision:Within last year Dental:Receives regular dental care  [facility]  Last pap: 9/1//25 negative  Colonoscopy: 10/04/21, every ten   Pt is without acute concerns.   Discussed the use of AI scribe software for clinical note transcription with the patient, who gave verbal consent to proceed.  History of Present Illness Dominique Nunez is a 58 year old female who presents for a routine follow-up.  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 A Pap smear conducted a month ago was negative. She undergoes Pap smears biannually due to a previous abnormal result. Her last mammogram in February was also negative.  She experiences severe heartburn associated with Mounjaro , a medication for diabetes. The heartburn is described as 'awful' and occurs consistently after taking the medication, persisting even at night. No aggravating dietary factors are identified. She is currently taking pantoprazole  for heartburn.  She is on Mounjaro , which causes constipation. Bowel movements occur every couple of days, with the initial release being larger and not painful.  She has a history of thyroid  issues, with previously slightly elevated thyroid  levels. She is on levothyroxine  for thyroid  management.  She is also on a cholesterol-lowering medication She maintains a diet high in broccoli and spinach, despite  disliking the gas it causes.  She reports difficulty sleeping and uses Ambien  to help fall asleep, though she still wakes up frequently during the night. No sleepwalking or other side effects from Ambien  are reported.  She engages in regular physical activity, including daily walks and climbing stairs at work. She reports not exercising regularly beyond these activities.  She has received her flu vaccine today and is up to date with her eye and dental exams, with the last eye exam in March.   Advanced Directives Patient does not have advanced directives    DEPRESSION SCREENING    04/13/2024    8:10 AM 01/13/2024    7:54 AM 01/27/2023    7:48 AM 09/11/2022    8:06 AM 04/10/2022   12:51 PM 02/25/2022    9:47 AM 04/01/2021   10:01 AM  PHQ 2/9 Scores  PHQ - 2 Score 1 0 2 4 6 4 6   PHQ- 9 Score 11 0 11 15 18 17 24      ROS: Negative unless specifically indicated above in HPI.    Current Outpatient Medications:    Blood Glucose Monitoring Suppl (ACCU-CHEK GUIDE ME) w/Device KIT, See admin instructions. follow package directions, Disp: , Rfl:    cariprazine  (VRAYLAR ) 3 MG capsule, Take 1 capsule (3 mg total) by mouth daily., Disp: 90 capsule, Rfl: 1   cetirizine (ZYRTEC) 10 MG tablet, Take 10 mg by mouth daily., Disp: , Rfl:    clonazePAM  (KLONOPIN ) 0.5 MG disintegrating tablet, Take 1 tablet (0.5 mg total) by mouth daily as needed., Disp: 30 tablet, Rfl: 3   Dextromethorphan-buPROPion  ER (AUVELITY ) 45-105 MG TBCR,  Take two tablets by mouth daily 7-8 hours between doses., Disp: 60 tablet, Rfl: 3   DOTTI 0.1 MG/24HR patch, Place onto the skin 2 (two) times a week., Disp: , Rfl:    DULoxetine  (CYMBALTA ) 30 MG capsule, Take 1 capsule (30 mg total) by mouth daily., Disp: 30 capsule, Rfl: 2   famotidine (PEPCID) 10 MG tablet, Take 1 tablet (10 mg total) by mouth 2 (two) times daily., Disp: 60 tablet, Rfl: 2   fluticasone  (FLONASE ) 50 MCG/ACT nasal spray, Place 1 spray into both nostrils 2 (two)  times daily., Disp: 16 g, Rfl: 5   Glucose Blood (BLOOD GLUCOSE TEST STRIPS) STRP, Check glucose prn symptoms May substitute to any manufacturer covered by patient's insurance., Disp: 100 strip, Rfl: 0   ibuprofen (ADVIL,MOTRIN) 200 MG tablet, Take 800 mg by mouth daily as needed for moderate pain., Disp: , Rfl:    Lancets MISC, Check glucose po every day prn symptoms Defer to any manufacturer covered with pt insurance, Disp: 100 each, Rfl: 0   levothyroxine  (SYNTHROID ) 88 MCG tablet, Take 1 tablet (88 mcg total) by mouth daily., Disp: 90 tablet, Rfl: 1   pantoprazole  (PROTONIX ) 20 MG tablet, Take 1 tablet (20 mg total) by mouth daily., Disp: 90 tablet, Rfl: 0   rizatriptan  (MAXALT ) 10 MG tablet, TAKE 1 TABLET BY MOUTH AS NEEDED FOR MIGRAINE. MAY REPEAT IN 2  HOURS IF NEEDED, Disp: 10 tablet, Rfl: 0   rosuvastatin  (CRESTOR ) 10 MG tablet, Take 1 tablet (10 mg total) by mouth daily., Disp: 90 tablet, Rfl: 3   sertraline  (ZOLOFT ) 100 MG tablet, Take 2 tablets (200 mg total) by mouth daily., Disp: 180 tablet, Rfl: 1   tirzepatide  (MOUNJARO ) 5 MG/0.5ML Pen, After completing four weeks of 2.5 mg qweek [place], start 5.0 mg weekly qweekly, Disp: 2 mL, Rfl: 2   zolpidem  (AMBIEN ) 10 MG tablet, Take 1 tablet (10 mg total) by mouth at bedtime as needed for sleep., Disp: 30 tablet, Rfl: 2   amphetamine -dextroamphetamine (ADDERALL XR) 25 MG 24 hr capsule, Take 1 capsule by mouth every morning. (Patient not taking: Reported on 04/13/2024), Disp: 30 capsule, Rfl: 0   amphetamine -dextroamphetamine (ADDERALL XR) 30 MG 24 hr capsule, Take 1 capsule (30 mg total) by mouth daily. (Patient not taking: Reported on 04/13/2024), Disp: 30 capsule, Rfl: 0    Objective:    BP 108/82 (BP Location: Left Arm, Patient Position: Sitting, Cuff Size: Large)   Pulse 86   Temp 97.8 F (36.6 C) (Temporal)   Ht 5' 2 (1.575 m)   Wt 258 lb 9.6 oz (117.3 kg)   SpO2 97%   BMI 47.30 kg/m   BP Readings from Last 3 Encounters:  04/13/24  108/82  03/15/24 127/79  01/13/24 118/82      Physical Exam Constitutional:      General: She is not in acute distress.    Appearance: Normal appearance. She is obese. She is not ill-appearing.  HENT:     Head: Normocephalic.     Right Ear: Tympanic membrane normal.     Left Ear: Tympanic membrane normal.     Nose: Nose normal.     Mouth/Throat:     Mouth: Mucous membranes are moist.  Eyes:     Extraocular Movements: Extraocular movements intact.     Pupils: Pupils are equal, round, and reactive to light.  Cardiovascular:     Rate and Rhythm: Normal rate and regular rhythm.  Pulmonary:     Effort: Pulmonary effort  is normal.     Breath sounds: Normal breath sounds.  Abdominal:     General: Abdomen is flat. Bowel sounds are normal.     Palpations: Abdomen is soft.     Tenderness: There is abdominal tenderness (left lower quadrant). There is no guarding or rebound.  Musculoskeletal:        General: Normal range of motion.     Cervical back: Normal range of motion.  Skin:    General: Skin is warm.     Capillary Refill: Capillary refill takes less than 2 seconds.  Neurological:     General: No focal deficit present.     Mental Status: She is alert.  Psychiatric:        Mood and Affect: Mood normal.        Behavior: Behavior normal.        Thought Content: Thought content normal.        Judgment: Judgment normal.      Title   Diabetic Foot Exam - detailed Is there a history of foot ulcer?: No Is there a foot ulcer now?: No Is there swelling?: No Is there elevated skin temperature?: No Is there abnormal foot shape?: No Is there a claw toe deformity?: No Are the toenails long?: No Are the toenails thick?: No Are the toenails ingrown?: No Is the skin thin, fragile, shiny and hairless?: No Normal Range of Motion?: Yes Is there foot or ankle muscle weakness?: No Do you have pain in calf while walking?: No Are the shoes appropriate in style and fit?: Yes Can the  patient see the bottom of their feet?: Yes Pulse Foot Exam completed.: Yes   Right Posterior Tibialis: Present Left posterior Tibialis: Present   Right Dorsalis Pedis: Present Left Dorsalis Pedis: Present     Semmes-Weinstein Monofilament Test + means has sensation and - means no sensation  R Foot Test Control: Pos L Foot Test Control: Pos   R Site 1-Great Toe: Pos L Site 1-Great Toe: Pos   R Site 4: Pos L Site 4: Pos   R site 5: Pos L Site 5: Pos  R Site 6: Pos L Site 6: Pos     Image components are not supported.   Image components are not supported. Image components are not supported.  [place] Comments     Results LABS Thyroid : T3  PATHOLOGY Pap smear: Negative (03/2024)      Assessment & Plan:   Assessment and Plan Assessment & Plan Adult Wellness Visit Routine adult wellness visit with up-to-date screenings and vaccinations. - Continue annual mammograms and Pap smears as per schedule - Ensure colonoscopy is repeated every ten years - Maintain regular eye and dental exams - Administer flu vaccine annually - Consider pneumonia and shingles vaccines -Patient Counseling(The following topics were reviewed):  Preventative care handout given to pt  Health maintenance and immunizations reviewed. Please refer to Health maintenance section. Pt advised on safe sex, wearing seatbelts in car, and proper nutrition labwork ordered today for annual Dental health: Discussed importance of regular tooth brushing, flossing, and dental visits.   Type 2 diabetes mellitus Type 2 diabetes mellitus managed with Tirzepatide  (Mounjaro ), experiencing heartburn as a side effect. - Prescribe Pepcid AC for heartburn - Monitor heartburn symptoms and consider reducing pantoprazole  if Pepcid AC is effective - Consider referral to GI for endoscopy if heartburn persists - Continue dietary management to avoid aggravating foods  Acquired hypothyroidism Thyroid  levels  slightly elevated, contributing to weight loss issues  and fatigue. - Repeat thyroid  function tests - Adjust levothyroxine  dose if thyroid  levels remain above goal  Mixed hyperlipidemia Mixed hyperlipidemia managed with dietary management. - Check cholesterol levels - Consider dietary adjustments to reduce gas  Gastroesophageal reflux disease with esophagitis Severe heartburn, particularly after taking Mounjaro , with potential long-term risks of pantoprazole  discussed. - Prescribe Pepcid AC - Monitor heartburn symptoms and consider reducing pantoprazole  if Pepcid AC is effective - Consider referral to GI for endoscopy if heartburn persists -discussed s/e of long term PPI   Constipation secondary to GLP-1 agonist therapy Constipation associated with Mounjaro  use, with abdominal tenderness likely due to constipation. - Use Miralax regularly to manage constipation - Monitor bowel movements and adjust Miralax use as needed  Insomnia Insomnia managed with Zolpidem  (Ambien ), effective for sleep onset but not maintenance. - Continue Zolpidem  as needed for sleep - Monitor sleep patterns and consider alternative treatments if insomnia persists          Follow-up: Return in about 6 months (around 10/12/2024) for f/u diabetes.   Ginger Patrick, FNP

## 2024-04-14 LAB — LIPID PANEL
Cholesterol: 148 mg/dL (ref 0–200)
HDL: 67 mg/dL (ref >39.00)
LDL Cholesterol: 52 mg/dL (ref 0–99)
NonHDL: 81.16
Total CHOL/HDL Ratio: 2
Triglycerides: 144 mg/dL (ref 0.0–149.0)
VLDL: 28.8 mg/dL (ref 0.0–40.0)

## 2024-04-14 LAB — T4, FREE: Free T4: 0.86 ng/dL (ref 0.60–1.60)

## 2024-04-14 LAB — VITAMIN B12: Vitamin B-12: 233 pg/mL (ref 211–911)

## 2024-04-15 ENCOUNTER — Ambulatory Visit: Payer: Self-pay | Admitting: Family

## 2024-04-15 DIAGNOSIS — E039 Hypothyroidism, unspecified: Secondary | ICD-10-CM

## 2024-04-15 MED ORDER — LEVOTHYROXINE SODIUM 88 MCG PO TABS: ORAL_TABLET | ORAL | 1 refills | Status: AC

## 2024-06-02 ENCOUNTER — Ambulatory Visit: Admitting: Behavioral Health

## 2024-06-06 NOTE — Progress Notes (Deleted)
 Office Visit Note  Patient: Dominique Nunez             Date of Birth: 1965-11-21           MRN: [id number]             PCP: Corwin Antu, FNP Referring: Corwin Antu, FNP Visit Date: 06/15/2024   Subjective:  No chief complaint on file.   History of Present Illness: Dominique Nunez is a 58 y.o. female here for follow up who presents with persistent joint pain and swelling.   Previous HPI 03/15/2024 Dominique Nunez is a 58 year old female who presents with persistent joint pain and swelling.   She has chronic joint pain primarily affecting her knees, back, and shoulders. Her knees are described as 'shot,' and she mentions that her whole body sometimes hurts. She received hyaluronic acid injections in her knees approximately eight to nine months ago, which provided relief for a couple of months, but the pain has since returned and is described as 'like a toothache.' She takes Aleve, ibuprofen, and Tylenol  occasionally for pain relief, but sometimes opts not to take anything and just rests.   She reports that her knees swell a lot and her ankles are swelling; her hands used to swell a lot but do not swell as badly as they used to. Her right shoulder has been painful for four to five months without any specific injury, and raising her arm can be painful.   She reports worsening fatigue and poor sleep, waking up every hour without any apparent cause. She occasionally takes Ambien  when she feels she cannot sleep. She has not undergone a sleep study.   She experiences constipation, which she attributes to starting Mounjaro . She has been able to maintain regular bowel movements in the morning but has started taking Metamucil to help with this issue.     Previous HPI 02/10/23 Dominique Nunez is a 58 y.o. female here for evaluation of elevated CRP checked in association with chronic joint pain in multiple areas.  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  Never required any formal physical therapy injection or surgical intervention for this.  Also has longstanding joint pain in both knees has been seeing [facility] for this in the past 6 years.  Notices a lot of clicking and popping especially with deep bending knee movements.  She has increased trouble with climbing stairs due to pain.  Tried several rounds of cortisone injections with benefit more recently had viscosupplementation injections last was about 2 years ago.  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  Amount of pain is typically worse at end of the day and dependent on the amount of use.  She sees visible swelling in her hands and feet usually worse by the end of the day.  No particular redness or discoloration.  She has a family history of osteoarthritis on her mother side.  She does not take any daily oral anti-inflammatory medicines and sometimes uses Tylenol  as needed. Besides joint pain she has chronic rash with facial rosacea uses topical ivermectin which is beneficial but usually gets flareups again afterwards.  Has some darkening and some spots in multiple areas but no other erythematous rashes. Denies any history of oral nasal ulcers, chronic lymphadenopathy, focal alopecia,  or Raynaud's symptoms.  Does not have any history of abnormal bleeding or blood clots. Had hysterectomy at age 83 due to prolapse after her second pregnancy.  Had bilateral oophorectomy about 6 years ago.   Labs reviewed CRP 18 ANA neg RF neg ESR 34   No Rheumatology ROS completed.   PMFS History:  Patient Active Problem List   Diagnosis Date Noted   Long-term current use of proton pump inhibitor therapy 04/13/2024   Elevated sedimentation rate 03/15/2024   Gastroesophageal reflux disease with esophagitis without hemorrhage 01/13/2024    Bilateral hand pain 02/10/2023   Generalized osteoarthritis of multiple sites 02/10/2023   Controlled type 2 diabetes mellitus with complication, without long-term current use of insulin (HCC) 01/05/2023   History of melanoma 09/11/2022   Bipolar 1 disorder, depressed (HCC) 02/25/2022   Generalized anxiety disorder with panic attacks 02/25/2022   Hyperlipidemia 03/28/2020   Morbid obesity (HCC) 03/28/2020   Insomnia 10/13/2019   Major depression, recurrent 08/21/2019   Attention deficit disorder (ADD) 12/21/2017   CRP elevated 06/16/2016   Chronic arthralgias of knees and hips 05/22/2016   Vitamin D  deficiency 02/14/2015   Hypothyroidism 12/26/2014    Past Medical History:  Diagnosis Date   Anxiety    Bipolar disorder (HCC)    Depression    GERD (gastroesophageal reflux disease)    Headache    Heart murmur    History of fainting spells of unknown cause    Hypothyroidism    Kidney stones    Melanoma (HCC)    right arm   Migraine    PONV (postoperative nausea and vomiting)    Thyroid  disease     Family History  Problem Relation Age of Onset   Ovarian cancer Mother    Asthma Mother    COPD Mother    Breast cancer Mother 58   Alcohol abuse Father    Kidney disease Father    Diabetes Father    Bipolar disorder Father        suicide attempt   Anxiety disorder Father    Depression Father    Kidney disease Brother    Hepatitis C Brother    Alcohol abuse Brother    Bipolar disorder Brother    Anxiety disorder Brother    Depression Brother    Alcohol abuse Maternal Grandfather    Alcohol abuse Paternal Grandfather    Arthritis Paternal Grandfather    Heart disease Paternal Grandfather    Stroke Paternal Grandfather    Pancreatic cancer Paternal Grandfather    Stroke Paternal Aunt    Breast cancer Paternal Aunt    Past Surgical History:  Procedure Laterality Date   ABDOMINAL HYSTERECTOMY  2000   complete   BREAST BIOPSY Left 1984   CHOLECYSTECTOMY  2000    COLONOSCOPY WITH PROPOFOL  N/A 10/04/2021   Procedure: COLONOSCOPY WITH PROPOFOL ;  Surgeon: Therisa Bi, MD;  Location: [facility];  Service: Gastroenterology;  Laterality: N/A;   CYSTOSCOPY N/A 06/26/2016   Procedure: CYSTOSCOPY;  Surgeon: Norleen Skill, MD;  Location: [facility];  Service: Gynecology;  Laterality: N/A;   INNER EAR SURGERY     LAPAROSCOPIC SALPINGO OOPHERECTOMY Bilateral 06/26/2016   Procedure: LAPAROSCOPIC SALPINGO OOPHORECTOMY,BILATERAL;  Surgeon: Norleen Skill, MD;  Location: [facility];  Service: Gynecology;  Laterality: Bilateral;   TONSILLECTOMY AND ADENOIDECTOMY  1976   WISDOM TOOTH EXTRACTION     Social History   Social History Narrative   10/13/19   From: the area   Living: with Husband  Ryan [phone])   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   Physical Exam   Musculoskeletal Exam: ***  CDAI Exam: CDAI Score: -- Patient Global: --; Provider Global: -- Swollen: --; Tender: -- Joint Exam 06/15/2024   No joint exam has been documented for this visit   There is currently no information documented on the homunculus. Go to the Rheumatology activity and complete the homunculus joint exam.  Investigation: No additional findings.  Imaging: No results found.  Recent Labs: Lab Results  Component Value Date   WBC 9.3 01/13/2024   HGB 12.9 01/13/2024   PLT 304.0 01/13/2024    NA 138 01/13/2024   K 4.7 01/13/2024   CL 102 01/13/2024   CO2 28 01/13/2024   GLUCOSE 146 (H) 01/13/2024   BUN 14 01/13/2024   CREATININE 0.90 01/13/2024   BILITOT 0.6 01/13/2024   ALKPHOS 73 01/13/2024   AST 15 01/13/2024   ALT 19 01/13/2024   PROT 6.3 01/13/2024   ALBUMIN 3.9 01/13/2024   CALCIUM  9.0 01/13/2024   GFRAA 71 03/28/2020    Speciality Comments: No specialty comments available.  Procedures:  No procedures performed Allergies: Metformin  and related and Ozempic  (0.25 or 0.5 mg-dose) [semaglutide (0.25 or 0.5mg -dos)]   Assessment / Plan:     Visit Diagnoses: No diagnosis found.  ***  Orders: No orders of the defined types were placed in this encounter.  No orders of the defined types were placed in this encounter.    Follow-Up Instructions: No follow-ups on file.   Kathryn Cosby M Eben Choinski, CMA  Note - This record has been created using Animal nutritionist.  Chart creation errors have been sought, but may not always  have been located. Such creation errors do not reflect on  the standard of medical care.

## 2024-06-14 ENCOUNTER — Encounter: Payer: Self-pay | Admitting: Behavioral Health

## 2024-06-14 ENCOUNTER — Ambulatory Visit: Admitting: Behavioral Health

## 2024-06-14 DIAGNOSIS — M159 Polyosteoarthritis, unspecified: Secondary | ICD-10-CM

## 2024-06-14 DIAGNOSIS — F411 Generalized anxiety disorder: Secondary | ICD-10-CM | POA: Diagnosis not present

## 2024-06-14 DIAGNOSIS — R700 Elevated erythrocyte sedimentation rate: Secondary | ICD-10-CM | POA: Diagnosis not present

## 2024-06-14 DIAGNOSIS — F319 Bipolar disorder, unspecified: Secondary | ICD-10-CM

## 2024-06-14 DIAGNOSIS — F410 Panic disorder [episodic paroxysmal anxiety] without agoraphobia: Secondary | ICD-10-CM

## 2024-06-14 MED ORDER — SERTRALINE HCL 100 MG PO TABS: 200.0000 mg | ORAL_TABLET | Freq: Every day | ORAL | 1 refills | Status: AC

## 2024-06-14 MED ORDER — DULOXETINE HCL 30 MG PO CPEP: 30.0000 mg | ORAL_CAPSULE | Freq: Every day | ORAL | 0 refills | Status: DC

## 2024-06-14 MED ORDER — AUVELITY 45-105 MG PO TBCR: EXTENDED_RELEASE_TABLET | ORAL | 3 refills | Status: DC

## 2024-06-14 NOTE — Progress Notes (Signed)
 [facility]  Patient ID: ARWEN HASELEY,  MRN: [id number]  PCP: Corwin Antu, FNP  Date of Evaluation: 06/14/2024 Time spent:20 minutes  Chief Complaint:   HISTORY/CURRENT STATUS: HPI Dominique Nunez, 58  year old female presents to this for follow up  and medication management. No changes this visit.  Still liking Auvelity  and feels the medication is working well.  Says that she is trying to remain positive and practice healthy coping mechanisms.  Ran out of Cymbalta  for body pain. Pharmacy has not had for one week. She will be working to improve her situation.  Reports depression  at  3/10 and anxiety is 2/10. She denies mania, no psychosis, no auditory or visual hallucinations.  No SI or HI.     Individual Medical History/ Review of Systems: Changes? :No   Allergies: Metformin  and related and Ozempic  (0.25 or 0.5 mg-dose) [semaglutide (0.25 or 0.5mg -dos)]  Current Medications:  Current Outpatient Medications:    amphetamine -dextroamphetamine (ADDERALL XR) 25 MG 24 hr capsule, Take 1 capsule by mouth every morning. (Patient not taking: Reported on 04/13/2024), Disp: 30 capsule, Rfl: 0   amphetamine -dextroamphetamine (ADDERALL XR) 30 MG 24 hr capsule, Take 1 capsule (30 mg total) by mouth daily. (Patient not taking: Reported on 04/13/2024), Disp: 30 capsule, Rfl: 0   Blood Glucose Monitoring Suppl (ACCU-CHEK GUIDE ME) w/Device KIT, See admin instructions. follow package directions, Disp: , Rfl:    cariprazine  (VRAYLAR ) 3 MG capsule, Take 1 capsule (3 mg total) by mouth daily., Disp: 90 capsule, Rfl: 1   cetirizine (ZYRTEC) 10 MG tablet, Take 10 mg by mouth daily., Disp: , Rfl:    clonazePAM  (KLONOPIN ) 0.5 MG disintegrating tablet, Take 1 tablet (0.5 mg total) by mouth daily as needed., Disp: 30 tablet, Rfl: 3   Dextromethorphan-buPROPion  ER (AUVELITY ) 45-105 MG TBCR, Take two tablets by mouth daily 7-8 hours between doses., Disp: 60 tablet, Rfl: 3   DOTTI 0.1 MG/24HR patch, Place  onto the skin 2 (two) times a week., Disp: , Rfl:    DULoxetine  (CYMBALTA ) 30 MG capsule, Take 1 capsule (30 mg total) by mouth daily., Disp: 30 capsule, Rfl: 0   famotidine  (PEPCID ) 10 MG tablet, Take 1 tablet (10 mg total) by mouth 2 (two) times daily., Disp: 60 tablet, Rfl: 2   fluticasone  (FLONASE ) 50 MCG/ACT nasal spray, Place 1 spray into both nostrils 2 (two) times daily., Disp: 16 g, Rfl: 5   Glucose Blood (BLOOD GLUCOSE TEST STRIPS) STRP, Check glucose prn symptoms May substitute to any manufacturer covered by patient's insurance., Disp: 100 strip, Rfl: 0   ibuprofen (ADVIL,MOTRIN) 200 MG tablet, Take 800 mg by mouth daily as needed for moderate pain., Disp: , Rfl:    Lancets MISC, Check glucose po every day prn symptoms Defer to any manufacturer covered with pt insurance, Disp: 100 each, Rfl: 0   levothyroxine  (SYNTHROID ) 88 MCG tablet, Take one po every day for six days take two tablets on day 7, Disp: 90 tablet, Rfl: 1   pantoprazole  (PROTONIX ) 20 MG tablet, Take 1 tablet (20 mg total) by mouth daily., Disp: 90 tablet, Rfl: 0   rizatriptan  (MAXALT ) 10 MG tablet, TAKE 1 TABLET BY MOUTH AS NEEDED FOR MIGRAINE. MAY REPEAT IN 2  HOURS IF NEEDED, Disp: 10 tablet, Rfl: 0   rosuvastatin  (CRESTOR ) 10 MG tablet, Take 1 tablet (10 mg total) by mouth daily., Disp: 90 tablet, Rfl: 3   sertraline  (ZOLOFT ) 100 MG tablet, Take 2 tablets (200 mg total) by mouth daily.,  Disp: 180 tablet, Rfl: 1   tirzepatide  (MOUNJARO ) 5 MG/0.5ML Pen, After completing four weeks of 2.5 mg qweek [place], start 5.0 mg weekly qweekly, Disp: 2 mL, Rfl: 2   zolpidem  (AMBIEN ) 10 MG tablet, Take 1 tablet (10 mg total) by mouth at bedtime as needed for sleep., Disp: 30 tablet, Rfl: 2 Medication Side Effects: none  Family Medical/ Social History: Changes? No  MENTAL HEALTH EXAM:  There were no vitals taken for this visit.There is no height or weight on file to calculate BMI.  General Appearance: Casual, Neat, and Well Groomed   Eye Contact:  Good  Speech:  Clear and Coherent  Volume:  Normal  Mood:  NA  Affect:  Appropriate  Thought Process:  Coherent  Orientation:  Full (Time, Place, and Person)  Thought Content: Logical   Suicidal Thoughts:  No  Homicidal Thoughts:  No  Memory:  WNL  Judgement:  Good  Insight:  Good  Psychomotor Activity:  Normal  Concentration:  Concentration: Good  Recall:  Good  Fund of Knowledge: Good  Language: Good  Assets:  Desire for Improvement  ADL's:  Intact  Cognition: WNL  Prognosis:  Good    DIAGNOSES:    ICD-10-CM   1. Generalized osteoarthritis of multiple sites  M15.9 DULoxetine  (CYMBALTA ) 30 MG capsule    2. Elevated sedimentation rate  R70.0 DULoxetine  (CYMBALTA ) 30 MG capsule    3. Bipolar 1 disorder, depressed (HCC)  F31.9 sertraline  (ZOLOFT ) 100 MG tablet    Dextromethorphan-buPROPion  ER (AUVELITY ) 45-105 MG TBCR    4. Generalized anxiety disorder with panic attacks  F41.1 sertraline  (ZOLOFT ) 100 MG tablet   F41.0       Receiving Psychotherapy: No    RECOMMENDATIONS:   Greater than 50% of  30 min face to face time with patient was spent on counseling and coordination of care.  We discussed her current level of stability.  She is pleasant and calm today.  No medication adjustments or changes are indicated.  Stat Cymbalta  Rx to [facility] in [place] due to out of stock at [facility].   We agreed to:   AIMS=0    06/14/24   Continue Vraylar  3 mg daily Continue  Klonopin  0.5 mg daily as needed Continue Sertraline  200 mg daily Continue Auvelity   90-210 mg daily.  Continue Ambien  10 mg at bedtime Continue Adderall to 30  mg ER daily   To call for worsening symptoms or side effects Provided emergency contact information Reviewed PDMP    Dominique DELENA Pizza, NP

## 2024-06-15 ENCOUNTER — Ambulatory Visit: Admitting: Internal Medicine

## 2024-06-15 ENCOUNTER — Telehealth: Payer: Self-pay | Admitting: Behavioral Health

## 2024-06-15 DIAGNOSIS — L719 Rosacea, unspecified: Secondary | ICD-10-CM

## 2024-06-15 DIAGNOSIS — F319 Bipolar disorder, unspecified: Secondary | ICD-10-CM

## 2024-06-15 DIAGNOSIS — M159 Polyosteoarthritis, unspecified: Secondary | ICD-10-CM

## 2024-06-15 MED ORDER — AUVELITY 45-105 MG PO TBCR: EXTENDED_RELEASE_TABLET | ORAL | 3 refills | Status: DC

## 2024-06-15 NOTE — Telephone Encounter (Signed)
 Sent Auvelity  Rx to [facility].

## 2024-06-15 NOTE — Telephone Encounter (Signed)
 Dominique Nunez called and said that brian sent the auvelity  to the wrong pharmacy. Please cancel at [facility] and send to [facility] in [place]

## 2024-06-20 ENCOUNTER — Telehealth: Payer: Self-pay | Admitting: Behavioral Health

## 2024-06-20 DIAGNOSIS — F319 Bipolar disorder, unspecified: Secondary | ICD-10-CM

## 2024-06-20 MED ORDER — AUVELITY 45-105 MG PO TBCR: EXTENDED_RELEASE_TABLET | ORAL | 0 refills | Status: DC

## 2024-06-20 NOTE — Telephone Encounter (Signed)
 Pt called at 9:35a requesting Auvelity  to go to [facility], not [facility].  Next appt 3/3

## 2024-06-20 NOTE — Telephone Encounter (Signed)
 Called patient to verify where to send Auvelity  script. Last week she said send to [facility]. She has to use [facility] for maintenance medications. Updated pharmacy profile and Rx sent to [facility].

## 2024-06-27 ENCOUNTER — Other Ambulatory Visit: Payer: Self-pay

## 2024-06-27 DIAGNOSIS — F319 Bipolar disorder, unspecified: Secondary | ICD-10-CM

## 2024-06-27 MED ORDER — AUVELITY 45-105 MG PO TBCR: EXTENDED_RELEASE_TABLET | ORAL | 0 refills | Status: AC

## 2024-07-07 ENCOUNTER — Other Ambulatory Visit: Payer: Self-pay | Admitting: Behavioral Health

## 2024-07-07 DIAGNOSIS — F5105 Insomnia due to other mental disorder: Secondary | ICD-10-CM

## 2024-07-11 ENCOUNTER — Other Ambulatory Visit: Payer: Self-pay | Admitting: Behavioral Health

## 2024-07-11 DIAGNOSIS — R700 Elevated erythrocyte sedimentation rate: Secondary | ICD-10-CM

## 2024-07-11 DIAGNOSIS — M159 Polyosteoarthritis, unspecified: Secondary | ICD-10-CM

## 2024-07-11 NOTE — Telephone Encounter (Signed)
 What pharmacy? Last RF was sent to [facility].

## 2024-07-12 ENCOUNTER — Other Ambulatory Visit: Payer: Self-pay

## 2024-07-12 DIAGNOSIS — M159 Polyosteoarthritis, unspecified: Secondary | ICD-10-CM

## 2024-07-12 DIAGNOSIS — R700 Elevated erythrocyte sedimentation rate: Secondary | ICD-10-CM

## 2024-07-12 MED ORDER — DULOXETINE HCL 30 MG PO CPEP: 30.0000 mg | ORAL_CAPSULE | Freq: Every day | ORAL | 0 refills | Status: AC

## 2024-07-19 ENCOUNTER — Telehealth: Payer: Self-pay | Admitting: Behavioral Health

## 2024-07-19 NOTE — Telephone Encounter (Signed)
 Patient Dominique Nunez today stating she is having difficulty with [facility]. She stated she has been waiting for 3 wks for the Auvelity . Pls advise.

## 2024-07-19 NOTE — Telephone Encounter (Signed)
 Called [facility]. When Rx was sent originally it said take 2 tablets a day, 7-8 hours apart. They didn't like the way it was written. A corrected Rx for 1 tablet twice a day, 7-8 hours apart was sent on 12/15. Pharmacy tech I spoke with today said medication is usually taken 8 hours apart and asked if they could change the Rx to read 8 hours versus 7-8 hours. Will make changes and process Rx. Patient notified.

## 2024-07-19 NOTE — Telephone Encounter (Signed)
 LVM to [facility]

## 2024-07-20 NOTE — Telephone Encounter (Signed)
 Called patient to FU on [facility]/Auvelity . She said she received a text last night that order was on the way. She said she found a box of samples so she doesn't need anything from us  currently.

## 2024-08-01 ENCOUNTER — Ambulatory Visit
Admission: EM | Admit: 2024-08-01 | Discharge: 2024-08-01 | Disposition: A | Discharge status: Home / Self Care | Attending: Emergency Medicine | Admitting: Emergency Medicine

## 2024-08-01 ENCOUNTER — Telehealth: Admitting: Physician Assistant

## 2024-08-01 ENCOUNTER — Encounter: Payer: Self-pay | Admitting: *Deleted

## 2024-08-01 DIAGNOSIS — H9202 Otalgia, left ear: Secondary | ICD-10-CM

## 2024-08-01 DIAGNOSIS — J069 Acute upper respiratory infection, unspecified: Secondary | ICD-10-CM

## 2024-08-01 DIAGNOSIS — H6692 Otitis media, unspecified, left ear: Secondary | ICD-10-CM | POA: Diagnosis not present

## 2024-08-01 MED ORDER — PSEUDOEPH-BROMPHEN-DM 30-2-10 MG/5ML PO SYRP: 10.0000 mL | ORAL_SOLUTION | Freq: Four times a day (QID) | ORAL | 0 refills | Status: AC | PRN

## 2024-08-01 MED ORDER — AMOXICILLIN 875 MG PO TABS: 875.0000 mg | ORAL_TABLET | Freq: Two times a day (BID) | ORAL | 0 refills | Status: AC

## 2024-08-01 NOTE — Progress Notes (Signed)
 " Virtual Visit Consent   Dominique Nunez, you are scheduled for a virtual visit with a [facility] provider today. Just as with appointments in the office, your consent must be obtained to participate. Your consent will be active for this visit and any virtual visit you may have with one of our providers in the next 365 days. If you have a MyChart account, a copy of this consent can be sent to you electronically.  As this is a virtual visit, video technology does not allow for your provider to perform a traditional examination. This may limit your provider's ability to fully assess your condition. If your provider identifies any concerns that need to be evaluated in person or the need to arrange testing (such as labs, EKG, etc.), we will make arrangements to do so. Although advances in technology are sophisticated, we cannot ensure that it will always work on either your end or our end. If the connection with a video visit is poor, the visit may have to be switched to a telephone visit. With either a video or telephone visit, we are not always able to ensure that we have a secure connection.  By engaging in this virtual visit, you consent to the provision of healthcare and authorize for your insurance to be billed (if applicable) for the services provided during this visit. Depending on your insurance coverage, you may receive a charge related to this service.  I need to obtain your verbal consent now. Are you willing to proceed with your visit today? Dominique Nunez has provided verbal consent on 08/01/2024 for a virtual visit (video or telephone). Teena Shuck, [place]  Date: 08/01/2024 5:34 PM   Virtual Visit via Video Note   I, Teena Shuck, connected with  Dominique Nunez  ([id number], 1966-03-08) on 08/01/24 at  5:30 PM EST by a video-enabled telemedicine application and verified that I am speaking with the correct person using two identifiers.  Location: Patient: Virtual Visit Location Patient:  Home Provider: Virtual Visit Location Provider: Home Office   I discussed the limitations of evaluation and management by telemedicine and the availability of in person appointments. The patient expressed understanding and agreed to proceed.    History of Present Illness: Dominique Nunez is a 59 y.o. who identifies as a female who was assigned female at birth, and is being seen today for left ear pain.  HPI: Otalgia  There is pain in the left ear. This is a new problem. The current episode started today. The problem occurs constantly. The problem has been unchanged. There has been no fever. The pain is mild. Pertinent negatives include no coughing, rhinorrhea or sore throat. There is no history of hearing loss.    Problems:  Patient Active Problem List   Diagnosis Date Noted   Long-term current use of proton pump inhibitor therapy 04/13/2024   Elevated sedimentation rate 03/15/2024   Gastroesophageal reflux disease with esophagitis without hemorrhage 01/13/2024   Bilateral hand pain 02/10/2023   Generalized osteoarthritis of multiple sites 02/10/2023   Controlled type 2 diabetes mellitus with complication, without long-term current use of insulin (HCC) 01/05/2023   History of melanoma 09/11/2022   Bipolar 1 disorder, depressed (HCC) 02/25/2022   Generalized anxiety disorder with panic attacks 02/25/2022   Hyperlipidemia 03/28/2020   Morbid obesity (HCC) 03/28/2020   Insomnia 10/13/2019   Major depression, recurrent 08/21/2019   Attention deficit disorder (ADD) 12/21/2017   CRP elevated 06/16/2016   Chronic arthralgias of knees and  hips 05/22/2016   Vitamin D  deficiency 02/14/2015   Hypothyroidism 12/26/2014    Allergies: Allergies[1] Medications: Current Medications[2]  Observations/Objective: Patient is well-developed, well-nourished in no acute distress.  Resting comfortably  at home.  Head is normocephalic, atraumatic.  No labored breathing.  Speech is clear and coherent  with logical content.  Patient is alert and oriented at baseline.    Assessment and Plan: 1. Left ear pain (Primary)  2. Upper respiratory tract infection, unspecified type  Patient presenting with URI. Differentials include allergic rhinitis, COVID,  bacterial pneumonia, sinusitis. Do not suspect underlying cardiopulmonary process. I considered, but think unlikely, dangerous causes of this patient's symptoms to include ACS, CHF or pneumonia, pneumothorax. Patient is nontoxic and not in need of emergent medical intervention.  Plan: reassurance, reassessment, discharge with PCP follow-up  Follow Up Instructions: I discussed the assessment and treatment plan with the patient. The patient was provided an opportunity to ask questions and all were answered. The patient agreed with the plan and demonstrated an understanding of the instructions.  A copy of instructions were sent to the patient via MyChart unless otherwise noted below.     The patient was advised to call back or seek an in-person evaluation if the symptoms worsen or if the condition fails to improve as anticipated.    Teena Shuck, PA-C    [1]  Allergies Allergen Reactions   Metformin  And Related Nausea Only   Ozempic  (0.25 Or 0.5 Mg-Dose) [Semaglutide (0.25 Or 0.5mg -Dos)] Nausea Only    Felt bloated and 'morning sickness'   [2]  Current Outpatient Medications:    amphetamine -dextroamphetamine (ADDERALL XR) 25 MG 24 hr capsule, Take 1 capsule by mouth every morning. (Patient not taking: Reported on 04/13/2024), Disp: 30 capsule, Rfl: 0   amphetamine -dextroamphetamine (ADDERALL XR) 30 MG 24 hr capsule, Take 1 capsule (30 mg total) by mouth daily. (Patient not taking: Reported on 04/13/2024), Disp: 30 capsule, Rfl: 0   Blood Glucose Monitoring Suppl (ACCU-CHEK GUIDE ME) w/Device KIT, See admin instructions. follow package directions, Disp: , Rfl:    cariprazine  (VRAYLAR ) 3 MG capsule, Take 1 capsule (3 mg total) by mouth  daily., Disp: 90 capsule, Rfl: 1   cetirizine (ZYRTEC) 10 MG tablet, Take 10 mg by mouth daily., Disp: , Rfl:    clonazePAM  (KLONOPIN ) 0.5 MG disintegrating tablet, Take 1 tablet (0.5 mg total) by mouth daily as needed., Disp: 30 tablet, Rfl: 3   Dextromethorphan-buPROPion  ER (AUVELITY ) 45-105 MG TBCR, Take 1 tablet twice a day, 7-8 hours a part., Disp: 180 tablet, Rfl: 0   DOTTI 0.1 MG/24HR patch, Place onto the skin 2 (two) times a week., Disp: , Rfl:    DULoxetine  (CYMBALTA ) 30 MG capsule, Take 1 capsule (30 mg total) by mouth daily., Disp: 90 capsule, Rfl: 0   famotidine  (PEPCID ) 10 MG tablet, Take 1 tablet (10 mg total) by mouth 2 (two) times daily., Disp: 60 tablet, Rfl: 2   fluticasone  (FLONASE ) 50 MCG/ACT nasal spray, Place 1 spray into both nostrils 2 (two) times daily., Disp: 16 g, Rfl: 5   Glucose Blood (BLOOD GLUCOSE TEST STRIPS) STRP, Check glucose prn symptoms May substitute to any manufacturer covered by patient's insurance., Disp: 100 strip, Rfl: 0   ibuprofen (ADVIL,MOTRIN) 200 MG tablet, Take 800 mg by mouth daily as needed for moderate pain., Disp: , Rfl:    Lancets MISC, Check glucose po every day prn symptoms Defer to any manufacturer covered with pt insurance, Disp: 100 each, Rfl: 0  levothyroxine  (SYNTHROID ) 88 MCG tablet, Take one po every day for six days take two tablets on day 7, Disp: 90 tablet, Rfl: 1   pantoprazole  (PROTONIX ) 20 MG tablet, Take 1 tablet (20 mg total) by mouth daily., Disp: 90 tablet, Rfl: 0   rizatriptan  (MAXALT ) 10 MG tablet, TAKE 1 TABLET BY MOUTH AS NEEDED FOR MIGRAINE. MAY REPEAT IN 2  HOURS IF NEEDED, Disp: 10 tablet, Rfl: 0   rosuvastatin  (CRESTOR ) 10 MG tablet, Take 1 tablet (10 mg total) by mouth daily., Disp: 90 tablet, Rfl: 3   sertraline  (ZOLOFT ) 100 MG tablet, Take 2 tablets (200 mg total) by mouth daily., Disp: 180 tablet, Rfl: 1   tirzepatide  (MOUNJARO ) 5 MG/0.5ML Pen, After completing four weeks of 2.5 mg qweek [place], start 5.0 mg weekly  qweekly, Disp: 2 mL, Rfl: 2   zolpidem  (AMBIEN ) 10 MG tablet, TAKE 1 TABLET(10 MG) BY MOUTH AT BEDTIME AS NEEDED FOR SLEEP, Disp: 30 tablet, Rfl: 2  "

## 2024-08-01 NOTE — ED Triage Notes (Signed)
 Patient states left ear pain that started today, no drainage

## 2024-08-01 NOTE — ED Provider Notes (Signed)
 " [facility]    CSN: [id number] Arrival date & time: 08/01/24  1752      History   Chief Complaint Chief Complaint  Patient presents with   Otalgia    HPI Dominique Nunez is a 59 y.o. female.  Patient presents with left ear pain that started this morning.  She reports mild occasional nonproductive cough which she attributes to allergies.  No fever, ear drainage, sore throat, shortness of breath.  Patient had a telehealth visit today; diagnosed with left ear pain and upper respiratory infection; treated with Bromfed-DM.  She has not gotten this medication from the pharmacy yet.  No OTC medications taken today.  The history is provided by the patient and medical records.    Past Medical History:  Diagnosis Date   Anxiety    Bipolar disorder (HCC)    Depression    GERD (gastroesophageal reflux disease)    Headache    Heart murmur    History of fainting spells of unknown cause    Hypothyroidism    Kidney stones    Melanoma (HCC)    right arm   Migraine    PONV (postoperative nausea and vomiting)    Thyroid  disease     Patient Active Problem List   Diagnosis Date Noted   Long-term current use of proton pump inhibitor therapy 04/13/2024   Elevated sedimentation rate 03/15/2024   Gastroesophageal reflux disease with esophagitis without hemorrhage 01/13/2024   Bilateral hand pain 02/10/2023   Generalized osteoarthritis of multiple sites 02/10/2023   Controlled type 2 diabetes mellitus with complication, without long-term current use of insulin (HCC) 01/05/2023   History of melanoma 09/11/2022   Bipolar 1 disorder, depressed (HCC) 02/25/2022   Generalized anxiety disorder with panic attacks 02/25/2022   Hyperlipidemia 03/28/2020   Morbid obesity (HCC) 03/28/2020   Insomnia 10/13/2019   Major depression, recurrent 08/21/2019   Attention deficit disorder (ADD) 12/21/2017   CRP elevated 06/16/2016   Chronic arthralgias of knees and hips 05/22/2016   Vitamin D   deficiency 02/14/2015   Hypothyroidism 12/26/2014    Past Surgical History:  Procedure Laterality Date   ABDOMINAL HYSTERECTOMY  2000   complete   BREAST BIOPSY Left 1984   CHOLECYSTECTOMY  2000   COLONOSCOPY WITH PROPOFOL  N/A 10/04/2021   Procedure: COLONOSCOPY WITH PROPOFOL ;  Surgeon: Therisa Bi, MD;  Location: [facility] ENDOSCOPY;  Service: Gastroenterology;  Laterality: N/A;   CYSTOSCOPY N/A 06/26/2016   Procedure: CYSTOSCOPY;  Surgeon: Norleen Skill, MD;  Location: [facility] ORS;  Service: Gynecology;  Laterality: N/A;   INNER EAR SURGERY     LAPAROSCOPIC SALPINGO OOPHERECTOMY Bilateral 06/26/2016   Procedure: LAPAROSCOPIC SALPINGO OOPHORECTOMY,BILATERAL;  Surgeon: Norleen Skill, MD;  Location: [facility] ORS;  Service: Gynecology;  Laterality: Bilateral;   TONSILLECTOMY AND ADENOIDECTOMY  1976   WISDOM TOOTH EXTRACTION      OB History   No obstetric history on file.      Home Medications    Prior to Admission medications  Medication Sig Start Date End Date Taking? Authorizing Provider  amoxicillin  (AMOXIL ) 875 MG tablet Take 1 tablet (875 mg total) by mouth 2 (two) times daily for 10 days. 08/01/24 08/11/24 Yes Corlis Sor DEL, NP  amphetamine -dextroamphetamine (ADDERALL XR) 25 MG 24 hr capsule Take 1 capsule by mouth every morning. Patient not taking: Reported on 04/13/2024 01/12/24   Teresa Redell LABOR, NP  amphetamine -dextroamphetamine (ADDERALL XR) 30 MG 24 hr capsule Take 1 capsule (30 mg total) by mouth daily. Patient not  taking: Reported on 04/13/2024 03/02/24 04/01/24  Teresa Redell LABOR, NP  Blood Glucose Monitoring Suppl (ACCU-CHEK GUIDE ME) w/Device KIT See admin instructions. follow package directions 01/05/23   [provider]  brompheniramine-pseudoephedrine-DM 30-2-10 MG/5ML syrup Take 10 mLs by mouth 4 (four) times daily as needed. 08/01/24   Rolan Berthold, PA-C  cariprazine  (VRAYLAR ) 3 MG capsule Take 1 capsule (3 mg total) by mouth daily. 03/02/24   Teresa Redell LABOR, NP  cetirizine  (ZYRTEC) 10 MG tablet Take 10 mg by mouth daily.    [provider]  clonazePAM  (KLONOPIN ) 0.5 MG disintegrating tablet Take 1 tablet (0.5 mg total) by mouth daily as needed. 03/02/24   Teresa Redell LABOR, NP  Dextromethorphan-buPROPion  ER (AUVELITY ) 45-105 MG TBCR Take 1 tablet twice a day, 7-8 hours a part. 06/27/24   Teresa Redell LABOR, NP  DOTTI 0.1 MG/24HR patch Place onto the skin 2 (two) times a week. 05/22/20   [provider]  DULoxetine  (CYMBALTA ) 30 MG capsule Take 1 capsule (30 mg total) by mouth daily. 07/12/24   Teresa Redell LABOR, NP  famotidine  (PEPCID ) 10 MG tablet Take 1 tablet (10 mg total) by mouth 2 (two) times daily. 04/13/24   Dugal, Tabitha, FNP  fluticasone  (FLONASE ) 50 MCG/ACT nasal spray Place 1 spray into both nostrils 2 (two) times daily. 08/31/23   Corwin Antu, FNP  Glucose Blood (BLOOD GLUCOSE TEST STRIPS) STRP Check glucose prn symptoms May substitute to any manufacturer covered by patient's insurance. 01/05/23   Dugal, Tabitha, FNP  ibuprofen (ADVIL,MOTRIN) 200 MG tablet Take 800 mg by mouth daily as needed for moderate pain.    [provider]  Lancets MISC Check glucose po every day prn symptoms Defer to any manufacturer covered with pt insurance 01/05/23   Corwin Antu, FNP  levothyroxine  (SYNTHROID ) 88 MCG tablet Take one po every day for six days take two tablets on day 7 04/15/24   Corwin Antu, FNP  pantoprazole  (PROTONIX ) 20 MG tablet Take 1 tablet (20 mg total) by mouth daily. 04/11/24   Corwin Antu, FNP  rizatriptan  (MAXALT ) 10 MG tablet TAKE 1 TABLET BY MOUTH AS NEEDED FOR MIGRAINE. MAY REPEAT IN 2  HOURS IF NEEDED 11/07/21   Velma Raisin, MD  rosuvastatin  (CRESTOR ) 10 MG tablet Take 1 tablet (10 mg total) by mouth daily. 01/18/24   Dugal, Tabitha, FNP  sertraline  (ZOLOFT ) 100 MG tablet Take 2 tablets (200 mg total) by mouth daily. 06/14/24   Teresa Redell LABOR, NP  tirzepatide  (MOUNJARO ) 5 MG/0.5ML Pen After completing four weeks of 2.5 mg qweek  [place], start 5.0 mg weekly qweekly 02/03/24   Dugal, Tabitha, FNP  zolpidem  (AMBIEN ) 10 MG tablet TAKE 1 TABLET(10 MG) BY MOUTH AT BEDTIME AS NEEDED FOR SLEEP 07/11/24   White, Redell LABOR, NP    Family History Family History  Problem Relation Age of Onset   Ovarian cancer Mother    Asthma Mother    COPD Mother    Breast cancer Mother 34   Alcohol abuse Father    Kidney disease Father    Diabetes Father    Bipolar disorder Father        suicide attempt   Anxiety disorder Father    Depression Father    Kidney disease Brother    Hepatitis C Brother    Alcohol abuse Brother    Bipolar disorder Brother    Anxiety disorder Brother    Depression Brother    Alcohol abuse Maternal Grandfather  Alcohol abuse Paternal Grandfather    Arthritis Paternal Grandfather    Heart disease Paternal Grandfather    Stroke Paternal Grandfather    Pancreatic cancer Paternal Grandfather    Stroke Paternal Aunt    Breast cancer Paternal Aunt     Social History Social History[1]   Allergies   Metformin  and related and Ozempic  (0.25 or 0.5 mg-dose) [semaglutide (0.25 or 0.5mg -dos)]   Review of Systems Review of Systems  Constitutional:  Negative for chills and fever.  HENT:  Positive for ear pain. Negative for ear discharge and sore throat.   Respiratory:  Positive for cough. Negative for shortness of breath.   Gastrointestinal:  Negative for diarrhea and vomiting.     Physical Exam Triage Vital Signs ED Triage Vitals  Encounter Vitals Group     BP 08/01/24 1834 131/85     Girls Systolic BP Percentile --      Girls Diastolic BP Percentile --      Boys Systolic BP Percentile --      Boys Diastolic BP Percentile --      Pulse Rate 08/01/24 1834 79     Resp 08/01/24 1834 18     Temp 08/01/24 1834 98.7 F (37.1 C)     Temp Source 08/01/24 1834 Oral     SpO2 08/01/24 1834 96 %     Weight 08/01/24 1832 262 lb (118.8 kg)     Height --      Head Circumference --      Peak Flow --      Pain  Score 08/01/24 1832 8     Pain Loc --      Pain Education --      Exclude from Growth Chart --    No data found.  Updated Vital Signs BP 131/85 (BP Location: Right Arm)   Pulse 79   Temp 98.7 F (37.1 C) (Oral)   Resp 18   Wt 262 lb (118.8 kg)   SpO2 96%   BMI 47.92 kg/m   Visual Acuity Right Eye Distance:   Left Eye Distance:   Bilateral Distance:    Right Eye Near:   Left Eye Near:    Bilateral Near:     Physical Exam Constitutional:      General: She is not in acute distress. HENT:     Right Ear: Tympanic membrane normal.     Left Ear: Tympanic membrane is erythematous.     Nose: Nose normal.     Mouth/Throat:     Mouth: Mucous membranes are moist.     Pharynx: Oropharynx is clear.  Cardiovascular:     Rate and Rhythm: Normal rate and regular rhythm.     Heart sounds: Normal heart sounds.  Pulmonary:     Effort: Pulmonary effort is normal. No respiratory distress.     Breath sounds: Normal breath sounds.  Neurological:     Mental Status: She is alert.      UC Treatments / Results  Labs (all labs ordered are listed, but only abnormal results are displayed) Labs Reviewed - No data to display  EKG   Radiology No results found.  Procedures Procedures (including critical care time)  Medications Ordered in UC Medications - No data to display  Initial Impression / Assessment and Plan / UC Course  I have reviewed the triage vital signs and the nursing notes.  Pertinent labs & imaging results that were available during my care of the patient were reviewed by me and  considered in my medical decision making (see chart for details).    Left otitis media.  Afebrile and vital signs are stable.  Treating today with amoxicillin .  Tylenol  or ibuprofen as needed.  Education provided on otitis media.  Instructed patient to follow-up with her PCP if she is not improving.  She agrees to plan of care.  Final Clinical Impressions(s) / UC Diagnoses   Final  diagnoses:  Left otitis media, unspecified otitis media type     Discharge Instructions      Take the amoxicillin  as directed for your ear infection.  Follow-up with your primary care provider if your symptoms are not improving.      ED Prescriptions     Medication Sig Dispense Auth. Provider   amoxicillin  (AMOXIL ) 875 MG tablet Take 1 tablet (875 mg total) by mouth 2 (two) times daily for 10 days. 20 tablet Corlis Burnard DEL, NP      PDMP not reviewed this encounter.    [1]  Social History Tobacco Use   Smoking status: Never    Passive exposure: Past   Smokeless tobacco: Never  Vaping Use   Vaping status: Never Used  Substance Use Topics   Alcohol use: Not Currently    Comment: rare   Drug use: No     Brayley, [place], NP 08/01/24 1850  "

## 2024-08-01 NOTE — Discharge Instructions (Signed)
Take the amoxicillin as directed for your ear infection.   ? ?Follow up with your primary care provider if your symptoms are not improving.   ? ?

## 2024-08-01 NOTE — Patient Instructions (Signed)
 " Randine CINDERELLA Sor, thank you for joining Teena Shuck, PA-C for today's virtual visit.  While this provider is not your primary care provider (PCP), if your PCP is located in our provider database this encounter information will be shared with them immediately following your visit.   A [facility] MyChart account gives you access to today's visit and all your visits, tests, and labs performed at [facility]  click here if you don't have a [facility] MyChart account or go to [email or web address]  Consent: (Patient) Dominique Nunez provided verbal consent for this virtual visit at the beginning of the encounter.  Current Medications:  Current Outpatient Medications:    amphetamine -dextroamphetamine (ADDERALL XR) 25 MG 24 hr capsule, Take 1 capsule by mouth every morning. (Patient not taking: Reported on 04/13/2024), Disp: 30 capsule, Rfl: 0   amphetamine -dextroamphetamine (ADDERALL XR) 30 MG 24 hr capsule, Take 1 capsule (30 mg total) by mouth daily. (Patient not taking: Reported on 04/13/2024), Disp: 30 capsule, Rfl: 0   Blood Glucose Monitoring Suppl (ACCU-CHEK GUIDE ME) w/Device KIT, See admin instructions. follow package directions, Disp: , Rfl:    cariprazine  (VRAYLAR ) 3 MG capsule, Take 1 capsule (3 mg total) by mouth daily., Disp: 90 capsule, Rfl: 1   cetirizine (ZYRTEC) 10 MG tablet, Take 10 mg by mouth daily., Disp: , Rfl:    clonazePAM  (KLONOPIN ) 0.5 MG disintegrating tablet, Take 1 tablet (0.5 mg total) by mouth daily as needed., Disp: 30 tablet, Rfl: 3   Dextromethorphan-buPROPion  ER (AUVELITY ) 45-105 MG TBCR, Take 1 tablet twice a day, 7-8 hours a part., Disp: 180 tablet, Rfl: 0   DOTTI 0.1 MG/24HR patch, Place onto the skin 2 (two) times a week., Disp: , Rfl:    DULoxetine  (CYMBALTA ) 30 MG capsule, Take 1 capsule (30 mg total) by mouth daily., Disp: 90 capsule, Rfl: 0   famotidine  (PEPCID ) 10 MG tablet, Take 1 tablet (10 mg total) by mouth 2 (two) times daily., Disp: 60  tablet, Rfl: 2   fluticasone  (FLONASE ) 50 MCG/ACT nasal spray, Place 1 spray into both nostrils 2 (two) times daily., Disp: 16 g, Rfl: 5   Glucose Blood (BLOOD GLUCOSE TEST STRIPS) STRP, Check glucose prn symptoms May substitute to any manufacturer covered by patient's insurance., Disp: 100 strip, Rfl: 0   ibuprofen (ADVIL,MOTRIN) 200 MG tablet, Take 800 mg by mouth daily as needed for moderate pain., Disp: , Rfl:    Lancets MISC, Check glucose po every day prn symptoms Defer to any manufacturer covered with pt insurance, Disp: 100 each, Rfl: 0   levothyroxine  (SYNTHROID ) 88 MCG tablet, Take one po every day for six days take two tablets on day 7, Disp: 90 tablet, Rfl: 1   pantoprazole  (PROTONIX ) 20 MG tablet, Take 1 tablet (20 mg total) by mouth daily., Disp: 90 tablet, Rfl: 0   rizatriptan  (MAXALT ) 10 MG tablet, TAKE 1 TABLET BY MOUTH AS NEEDED FOR MIGRAINE. MAY REPEAT IN 2  HOURS IF NEEDED, Disp: 10 tablet, Rfl: 0   rosuvastatin  (CRESTOR ) 10 MG tablet, Take 1 tablet (10 mg total) by mouth daily., Disp: 90 tablet, Rfl: 3   sertraline  (ZOLOFT ) 100 MG tablet, Take 2 tablets (200 mg total) by mouth daily., Disp: 180 tablet, Rfl: 1   tirzepatide  (MOUNJARO ) 5 MG/0.5ML Pen, After completing four weeks of 2.5 mg qweek [place], start 5.0 mg weekly qweekly, Disp: 2 mL, Rfl: 2   zolpidem  (AMBIEN ) 10 MG tablet, TAKE 1 TABLET(10 MG) BY MOUTH AT BEDTIME AS NEEDED  FOR SLEEP, Disp: 30 tablet, Rfl: 2   Medications ordered in this encounter:  No orders of the defined types were placed in this encounter.    *If you need refills on other medications prior to your next appointment, please contact your pharmacy*  Follow-Up: Call back or seek an in-person evaluation if the symptoms worsen or if the condition fails to improve as anticipated.  [facility] [phone]  Other Instructions Follow up with primary provider in 24-48 hours. Report to nearest ER with any worsening symptoms.    If you have  been instructed to have an in-person evaluation today at a local Urgent Care facility, please use the link below. It will take you to a list of all of our available [facility], including address, phone number and hours of operation. Please do not delay care.  [facility]  If you or a family member do not have a primary care provider, use the link below to schedule a visit and establish care. When you choose a [facility] primary care physician or advanced practice provider, you gain a long-term partner in health. Find a Primary Care Provider  Learn more about [facility]'s in-office and virtual care options:  - Get Care Now  "

## 2024-08-02 ENCOUNTER — Ambulatory Visit: Payer: Self-pay

## 2024-08-10 NOTE — Assessment & Plan Note (Signed)
 Dominique Nunez

## 2024-08-10 NOTE — Progress Notes (Unsigned)
 "  Office Visit Note  Patient: Dominique Nunez             Date of Birth: [birth year]           MRN: [id number]             PCP: Corwin Antu, FNP Referring: Corwin Antu, FNP Visit Date: 08/23/2024   Subjective:  No chief complaint on file.   History of Present Illness: Dominique Nunez is a 59 y.o. female here for follow up who presents with persistent joint pain and swelling.   Previous HPI 03/15/2024 Dominique Nunez is a 59 year old female who presents with persistent joint pain and swelling.   She has chronic joint pain primarily affecting her knees, back, and shoulders. Her knees are described as 'shot,' and she mentions that her whole body sometimes hurts. She received hyaluronic acid injections in her knees approximately eight to nine months ago, which provided relief for a couple of months, but the pain has since returned and is described as 'like a toothache.' She takes Aleve, ibuprofen, and Tylenol  occasionally for pain relief, but sometimes opts not to take anything and just rests.   She reports that her knees swell a lot and her ankles are swelling; her hands used to swell a lot but do not swell as badly as they used to. Her right shoulder has been painful for four to five months without any specific injury, and raising her arm can be painful.   She reports worsening fatigue and poor sleep, waking up every hour without any apparent cause. She occasionally takes Ambien  when she feels she cannot sleep. She has not undergone a sleep study.   She experiences constipation, which she attributes to starting Mounjaro . She has been able to maintain regular bowel movements in the morning but has started taking Metamucil to help with this issue.     Previous HPI 02/10/23 Dominique Nunez is a 59 y.o. female here for evaluation of elevated CRP checked in association with chronic joint pain in multiple areas.  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  Never required any formal physical therapy injection or surgical intervention for this.  Also has longstanding joint pain in both knees has been seeing [facility] for this in the past 6 years.  Notices a lot of clicking and popping especially with deep bending knee movements.  She has increased trouble with climbing stairs due to pain.  Tried several rounds of cortisone injections with benefit more recently had viscosupplementation injections last was about 2 years ago.  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  Amount of pain is typically worse at end of the day and dependent on the amount of use.  She sees visible swelling in her hands and feet usually worse by the end of the day.  No particular redness or discoloration.  She has a family history of osteoarthritis on her mother side.  She does not take any daily oral anti-inflammatory medicines and sometimes uses Tylenol  as needed. Besides joint pain she has chronic rash with facial rosacea uses topical ivermectin which is beneficial but usually gets flareups again afterwards.  Has some darkening and some spots in multiple areas but no other erythematous rashes. Denies any history of oral nasal ulcers, chronic lymphadenopathy, focal  alopecia, or Raynaud's symptoms.  Does not have any history of abnormal bleeding or blood clots. Had hysterectomy at age 71 due to prolapse after her second pregnancy.  Had bilateral oophorectomy about 6 years ago.   Labs reviewed CRP 18 ANA neg RF neg ESR 34   No Rheumatology ROS completed.   PMFS History:  Patient Active Problem List   Diagnosis Date Noted   Long-term current use of proton pump inhibitor therapy 04/13/2024   Elevated sedimentation rate 03/15/2024   Gastroesophageal reflux disease with esophagitis without hemorrhage 01/13/2024    Bilateral hand pain 02/10/2023   Generalized osteoarthritis of multiple sites 02/10/2023   Controlled type 2 diabetes mellitus with complication, without long-term current use of insulin (HCC) 01/05/2023   History of melanoma 09/11/2022   Bipolar 1 disorder, depressed (HCC) 02/25/2022   Generalized anxiety disorder with panic attacks 02/25/2022   Hyperlipidemia 03/28/2020   Morbid obesity (HCC) 03/28/2020   Insomnia 10/13/2019   Major depression, recurrent 08/21/2019   Attention deficit disorder (ADD) 12/21/2017   CRP elevated 06/16/2016   Chronic arthralgias of knees and hips 05/22/2016   Vitamin D  deficiency 02/14/2015   Hypothyroidism 12/26/2014    Past Medical History:  Diagnosis Date   Anxiety    Bipolar disorder (HCC)    Depression    GERD (gastroesophageal reflux disease)    Headache    Heart murmur    History of fainting spells of unknown cause    Hypothyroidism    Kidney stones    Melanoma (HCC)    right arm   Migraine    PONV (postoperative nausea and vomiting)    Thyroid  disease     Family History  Problem Relation Age of Onset   Ovarian cancer Mother    Asthma Mother    COPD Mother    Breast cancer Mother 11   Alcohol abuse Father    Kidney disease Father    Diabetes Father    Bipolar disorder Father        suicide attempt   Anxiety disorder Father    Depression Father    Kidney disease Brother    Hepatitis C Brother    Alcohol abuse Brother    Bipolar disorder Brother    Anxiety disorder Brother    Depression Brother    Alcohol abuse Maternal Grandfather    Alcohol abuse Paternal Grandfather    Arthritis Paternal Grandfather    Heart disease Paternal Grandfather    Stroke Paternal Grandfather    Pancreatic cancer Paternal Grandfather    Stroke Paternal Aunt    Breast cancer Paternal Aunt    Past Surgical History:  Procedure Laterality Date   ABDOMINAL HYSTERECTOMY  2000   complete   BREAST BIOPSY Left 1984   CHOLECYSTECTOMY  2000    COLONOSCOPY WITH PROPOFOL  N/A 10/04/2021   Procedure: COLONOSCOPY WITH PROPOFOL ;  Surgeon: Therisa Bi, MD;  Location: [facility] ENDOSCOPY;  Service: Gastroenterology;  Laterality: N/A;   CYSTOSCOPY N/A 06/26/2016   Procedure: CYSTOSCOPY;  Surgeon: Norleen Skill, MD;  Location: [facility] ORS;  Service: Gynecology;  Laterality: N/A;   INNER EAR SURGERY     LAPAROSCOPIC SALPINGO OOPHERECTOMY Bilateral 06/26/2016   Procedure: LAPAROSCOPIC SALPINGO OOPHORECTOMY,BILATERAL;  Surgeon: Norleen Skill, MD;  Location: [facility];  Service: Gynecology;  Laterality: Bilateral;   TONSILLECTOMY AND ADENOIDECTOMY  1976   WISDOM TOOTH EXTRACTION     Social History   Social History Narrative   10/13/19   From: the area   Living: with  Husband Ryan [phone])   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   Physical Exam   Musculoskeletal Exam: ***   Investigation: No additional findings.  Imaging: No results found.  Recent Labs: Lab Results  Component Value Date   WBC 9.3 01/13/2024   HGB 12.9 01/13/2024   PLT 304.0 01/13/2024   NA 138 01/13/2024   K 4.7 01/13/2024   CL 102 01/13/2024   CO2 28 01/13/2024   GLUCOSE 146 (H) 01/13/2024   BUN 14 01/13/2024   CREATININE 0.90 01/13/2024   BILITOT 0.6 01/13/2024   ALKPHOS 73 01/13/2024   AST 15 01/13/2024   ALT 19 01/13/2024   PROT 6.3 01/13/2024   ALBUMIN 3.9 01/13/2024    CALCIUM  9.0 01/13/2024   GFRAA 71 03/28/2020    Speciality Comments: No specialty comments available.  Procedures:  No procedures performed Allergies: Metformin  and related and Ozempic  (0.25 or 0.5 mg-dose) [semaglutide (0.25 or 0.5mg -dos)]   Assessment / Plan:     Visit Diagnoses:  Assessment & Plan Generalized osteoarthritis of multiple sites     Rosacea      ***  Follow-Up Instructions: No follow-ups on file.   Cari Burgo M Lyndsie Wallman, CMA  Note - This record has been created using Animal nutritionist.  Chart creation errors have been sought, but may not always  have been located. Such creation errors do not reflect on  the standard of medical care. "

## 2024-08-23 ENCOUNTER — Ambulatory Visit: Admitting: Internal Medicine

## 2024-08-23 DIAGNOSIS — L719 Rosacea, unspecified: Secondary | ICD-10-CM

## 2024-08-23 DIAGNOSIS — M159 Polyosteoarthritis, unspecified: Secondary | ICD-10-CM

## 2024-09-13 ENCOUNTER — Ambulatory Visit: Admitting: Behavioral Health
